# Patient Record
Sex: Female | Born: 1955 | Race: White | Hispanic: No | Marital: Married | Smoking: Never smoker
Health system: Southern US, Community
[De-identification: ages and names within clinical notes are randomized; demographics above are authoritative.]

## PROBLEM LIST (undated history)

## (undated) DIAGNOSIS — R112 Nausea with vomiting, unspecified: Secondary | ICD-10-CM

## (undated) DIAGNOSIS — R0600 Dyspnea, unspecified: Secondary | ICD-10-CM

## (undated) DIAGNOSIS — Z9889 Other specified postprocedural states: Secondary | ICD-10-CM

## (undated) DIAGNOSIS — T4145XA Adverse effect of unspecified anesthetic, initial encounter: Secondary | ICD-10-CM

## (undated) DIAGNOSIS — M5136 Other intervertebral disc degeneration, lumbar region: Secondary | ICD-10-CM

## (undated) DIAGNOSIS — M51369 Other intervertebral disc degeneration, lumbar region without mention of lumbar back pain or lower extremity pain: Secondary | ICD-10-CM

## (undated) DIAGNOSIS — J45909 Unspecified asthma, uncomplicated: Secondary | ICD-10-CM

## (undated) DIAGNOSIS — R2681 Unsteadiness on feet: Secondary | ICD-10-CM

## (undated) DIAGNOSIS — K5900 Constipation, unspecified: Secondary | ICD-10-CM

## (undated) DIAGNOSIS — M797 Fibromyalgia: Secondary | ICD-10-CM

## (undated) DIAGNOSIS — Z9289 Personal history of other medical treatment: Secondary | ICD-10-CM

## (undated) DIAGNOSIS — Z923 Personal history of irradiation: Secondary | ICD-10-CM

## (undated) DIAGNOSIS — F32A Depression, unspecified: Secondary | ICD-10-CM

## (undated) DIAGNOSIS — I639 Cerebral infarction, unspecified: Secondary | ICD-10-CM

## (undated) DIAGNOSIS — J189 Pneumonia, unspecified organism: Secondary | ICD-10-CM

## (undated) DIAGNOSIS — Z8489 Family history of other specified conditions: Secondary | ICD-10-CM

## (undated) DIAGNOSIS — T8859XA Other complications of anesthesia, initial encounter: Secondary | ICD-10-CM

## (undated) DIAGNOSIS — C801 Malignant (primary) neoplasm, unspecified: Secondary | ICD-10-CM

## (undated) DIAGNOSIS — C4490 Unspecified malignant neoplasm of skin, unspecified: Secondary | ICD-10-CM

## (undated) DIAGNOSIS — I10 Essential (primary) hypertension: Secondary | ICD-10-CM

## (undated) DIAGNOSIS — R51 Headache: Secondary | ICD-10-CM

## (undated) DIAGNOSIS — F329 Major depressive disorder, single episode, unspecified: Secondary | ICD-10-CM

## (undated) DIAGNOSIS — K219 Gastro-esophageal reflux disease without esophagitis: Secondary | ICD-10-CM

## (undated) DIAGNOSIS — R519 Headache, unspecified: Secondary | ICD-10-CM

## (undated) DIAGNOSIS — F419 Anxiety disorder, unspecified: Secondary | ICD-10-CM

## (undated) HISTORY — DX: Personal history of irradiation: Z92.3

## (undated) HISTORY — DX: Unsteadiness on feet: R26.81

## (undated) HISTORY — PX: ABDOMINAL HYSTERECTOMY: SHX81

## (undated) HISTORY — DX: Fibromyalgia: M79.7

## (undated) HISTORY — DX: Unspecified malignant neoplasm of skin, unspecified: C44.90

## (undated) HISTORY — PX: FOOT SURGERY: SHX648

## (undated) HISTORY — PX: CHOLECYSTECTOMY: SHX55

---

## 1999-12-24 ENCOUNTER — Ambulatory Visit
Admission: RE | Admit: 1999-12-24 | Discharge: 1999-12-24 | Payer: Self-pay | Admitting: Physical Medicine and Rehabilitation

## 2000-03-31 ENCOUNTER — Encounter: Payer: Self-pay | Admitting: Rheumatology

## 2000-03-31 ENCOUNTER — Encounter: Admission: RE | Admit: 2000-03-31 | Discharge: 2000-03-31 | Payer: Self-pay | Admitting: Rheumatology

## 2000-04-09 ENCOUNTER — Other Ambulatory Visit: Admission: RE | Admit: 2000-04-09 | Discharge: 2000-04-09 | Payer: Self-pay | Admitting: Gynecology

## 2001-07-22 ENCOUNTER — Emergency Department (HOSPITAL_COMMUNITY): Admission: EM | Admit: 2001-07-22 | Discharge: 2001-07-23 | Payer: Self-pay | Admitting: *Deleted

## 2001-08-05 ENCOUNTER — Encounter: Payer: Self-pay | Admitting: Orthopaedic Surgery

## 2001-08-05 ENCOUNTER — Ambulatory Visit (HOSPITAL_COMMUNITY): Admission: RE | Admit: 2001-08-05 | Discharge: 2001-08-05 | Payer: Self-pay | Admitting: Orthopaedic Surgery

## 2002-01-12 ENCOUNTER — Ambulatory Visit (HOSPITAL_COMMUNITY): Admission: RE | Admit: 2002-01-12 | Discharge: 2002-01-12 | Payer: Self-pay | Admitting: Cardiology

## 2003-02-18 ENCOUNTER — Emergency Department (HOSPITAL_COMMUNITY): Admission: EM | Admit: 2003-02-18 | Discharge: 2003-02-18 | Payer: Self-pay

## 2003-06-28 ENCOUNTER — Ambulatory Visit (HOSPITAL_COMMUNITY): Admission: RE | Admit: 2003-06-28 | Discharge: 2003-06-28 | Payer: Self-pay | Admitting: Family Medicine

## 2004-02-11 ENCOUNTER — Ambulatory Visit (HOSPITAL_COMMUNITY): Admission: RE | Admit: 2004-02-11 | Discharge: 2004-02-11 | Payer: Self-pay | Admitting: Orthopedic Surgery

## 2004-02-27 ENCOUNTER — Ambulatory Visit (HOSPITAL_COMMUNITY): Admission: RE | Admit: 2004-02-27 | Discharge: 2004-02-27 | Payer: Self-pay | Admitting: Orthopedic Surgery

## 2004-02-27 ENCOUNTER — Ambulatory Visit (HOSPITAL_BASED_OUTPATIENT_CLINIC_OR_DEPARTMENT_OTHER): Admission: RE | Admit: 2004-02-27 | Discharge: 2004-02-27 | Payer: Self-pay | Admitting: Orthopedic Surgery

## 2004-03-19 ENCOUNTER — Ambulatory Visit (HOSPITAL_COMMUNITY): Admission: RE | Admit: 2004-03-19 | Discharge: 2004-03-19 | Payer: Self-pay | Admitting: Orthopedic Surgery

## 2004-03-19 ENCOUNTER — Ambulatory Visit (HOSPITAL_BASED_OUTPATIENT_CLINIC_OR_DEPARTMENT_OTHER): Admission: RE | Admit: 2004-03-19 | Discharge: 2004-03-19 | Payer: Self-pay | Admitting: Orthopedic Surgery

## 2005-06-16 HISTORY — PX: ANTERIOR CERVICAL DISCECTOMY: SHX1160

## 2005-06-25 ENCOUNTER — Emergency Department (HOSPITAL_COMMUNITY): Admission: EM | Admit: 2005-06-25 | Discharge: 2005-06-25 | Payer: Self-pay | Admitting: Emergency Medicine

## 2005-10-24 ENCOUNTER — Inpatient Hospital Stay (HOSPITAL_COMMUNITY): Admission: RE | Admit: 2005-10-24 | Discharge: 2005-10-26 | Payer: Self-pay | Admitting: Neurosurgery

## 2005-11-26 ENCOUNTER — Ambulatory Visit (HOSPITAL_COMMUNITY): Admission: RE | Admit: 2005-11-26 | Discharge: 2005-11-26 | Payer: Self-pay | Admitting: Internal Medicine

## 2005-12-02 ENCOUNTER — Ambulatory Visit (HOSPITAL_COMMUNITY): Admission: RE | Admit: 2005-12-02 | Discharge: 2005-12-02 | Payer: Self-pay | Admitting: Family Medicine

## 2006-01-14 ENCOUNTER — Ambulatory Visit (HOSPITAL_COMMUNITY): Admission: RE | Admit: 2006-01-14 | Discharge: 2006-01-14 | Payer: Self-pay | Admitting: Family Medicine

## 2006-06-16 DIAGNOSIS — J189 Pneumonia, unspecified organism: Secondary | ICD-10-CM

## 2006-06-16 HISTORY — DX: Pneumonia, unspecified organism: J18.9

## 2006-06-16 HISTORY — PX: OTHER SURGICAL HISTORY: SHX169

## 2006-12-31 ENCOUNTER — Emergency Department (HOSPITAL_COMMUNITY): Admission: EM | Admit: 2006-12-31 | Discharge: 2006-12-31 | Payer: Self-pay | Admitting: Emergency Medicine

## 2007-02-23 ENCOUNTER — Ambulatory Visit (HOSPITAL_COMMUNITY): Admission: RE | Admit: 2007-02-23 | Discharge: 2007-02-23 | Payer: Self-pay | Admitting: Family Medicine

## 2007-04-16 ENCOUNTER — Ambulatory Visit (HOSPITAL_COMMUNITY): Admission: RE | Admit: 2007-04-16 | Discharge: 2007-04-16 | Payer: Self-pay | Admitting: Internal Medicine

## 2007-04-29 ENCOUNTER — Ambulatory Visit: Payer: Self-pay | Admitting: *Deleted

## 2007-06-09 ENCOUNTER — Emergency Department (HOSPITAL_COMMUNITY): Admission: EM | Admit: 2007-06-09 | Discharge: 2007-06-09 | Payer: Self-pay | Admitting: *Deleted

## 2007-06-17 HISTORY — PX: BACK SURGERY: SHX140

## 2007-06-30 ENCOUNTER — Ambulatory Visit (HOSPITAL_COMMUNITY): Admission: RE | Admit: 2007-06-30 | Discharge: 2007-06-30 | Payer: Self-pay | Admitting: Family Medicine

## 2007-08-31 ENCOUNTER — Inpatient Hospital Stay (HOSPITAL_COMMUNITY): Admission: RE | Admit: 2007-08-31 | Discharge: 2007-09-07 | Payer: Self-pay | Admitting: Neurosurgery

## 2007-09-07 ENCOUNTER — Inpatient Hospital Stay (HOSPITAL_COMMUNITY)
Admission: RE | Admit: 2007-09-07 | Discharge: 2007-09-22 | Payer: Self-pay | Admitting: Physical Medicine & Rehabilitation

## 2007-09-07 ENCOUNTER — Ambulatory Visit: Payer: Self-pay | Admitting: Physical Medicine & Rehabilitation

## 2007-09-14 ENCOUNTER — Ambulatory Visit: Payer: Self-pay | Admitting: Infectious Disease

## 2007-10-20 ENCOUNTER — Telehealth: Payer: Self-pay | Admitting: Internal Medicine

## 2007-10-20 ENCOUNTER — Ambulatory Visit: Payer: Self-pay | Admitting: Internal Medicine

## 2007-10-20 DIAGNOSIS — Z9889 Other specified postprocedural states: Secondary | ICD-10-CM | POA: Insufficient documentation

## 2007-10-20 DIAGNOSIS — IMO0002 Reserved for concepts with insufficient information to code with codable children: Secondary | ICD-10-CM

## 2007-10-20 DIAGNOSIS — L089 Local infection of the skin and subcutaneous tissue, unspecified: Secondary | ICD-10-CM | POA: Insufficient documentation

## 2007-11-09 ENCOUNTER — Ambulatory Visit: Payer: Self-pay | Admitting: Internal Medicine

## 2007-11-09 LAB — CONVERTED CEMR LAB
CRP: 2.9 mg/dL — ABNORMAL HIGH (ref <0.6)
Sed Rate: 71 mm/hr — ABNORMAL HIGH (ref 0–22)

## 2007-11-10 ENCOUNTER — Telehealth: Payer: Self-pay | Admitting: Internal Medicine

## 2007-11-10 ENCOUNTER — Encounter: Payer: Self-pay | Admitting: Internal Medicine

## 2007-11-17 ENCOUNTER — Encounter: Payer: Self-pay | Admitting: Internal Medicine

## 2007-11-22 ENCOUNTER — Encounter: Payer: Self-pay | Admitting: Internal Medicine

## 2007-11-26 ENCOUNTER — Encounter: Payer: Self-pay | Admitting: Internal Medicine

## 2007-12-02 ENCOUNTER — Telehealth: Payer: Self-pay | Admitting: Internal Medicine

## 2007-12-15 ENCOUNTER — Encounter: Payer: Self-pay | Admitting: Internal Medicine

## 2008-01-05 ENCOUNTER — Encounter: Payer: Self-pay | Admitting: Internal Medicine

## 2008-01-10 ENCOUNTER — Ambulatory Visit: Payer: Self-pay | Admitting: Internal Medicine

## 2008-01-19 ENCOUNTER — Encounter: Payer: Self-pay | Admitting: Internal Medicine

## 2008-01-24 ENCOUNTER — Telehealth: Payer: Self-pay | Admitting: Internal Medicine

## 2008-02-23 ENCOUNTER — Encounter: Payer: Self-pay | Admitting: Internal Medicine

## 2008-03-14 ENCOUNTER — Ambulatory Visit: Payer: Self-pay | Admitting: Internal Medicine

## 2008-03-18 ENCOUNTER — Emergency Department (HOSPITAL_COMMUNITY): Admission: EM | Admit: 2008-03-18 | Discharge: 2008-03-18 | Payer: Self-pay | Admitting: Emergency Medicine

## 2008-03-20 ENCOUNTER — Encounter: Payer: Self-pay | Admitting: Internal Medicine

## 2008-03-27 ENCOUNTER — Telehealth: Payer: Self-pay | Admitting: Internal Medicine

## 2008-03-27 ENCOUNTER — Encounter: Payer: Self-pay | Admitting: Internal Medicine

## 2008-04-05 ENCOUNTER — Telehealth: Payer: Self-pay

## 2008-04-06 ENCOUNTER — Telehealth: Payer: Self-pay | Admitting: Internal Medicine

## 2008-04-07 ENCOUNTER — Telehealth: Payer: Self-pay | Admitting: Internal Medicine

## 2008-05-12 ENCOUNTER — Encounter: Payer: Self-pay | Admitting: Internal Medicine

## 2008-05-12 ENCOUNTER — Ambulatory Visit (HOSPITAL_COMMUNITY): Admission: RE | Admit: 2008-05-12 | Discharge: 2008-05-12 | Payer: Self-pay | Admitting: Neurosurgery

## 2008-05-12 LAB — CONVERTED CEMR LAB
CRP: 0.8 mg/dL — ABNORMAL HIGH (ref <0.6)
Sed Rate: 64 mm/hr — ABNORMAL HIGH (ref 0–22)

## 2008-05-19 ENCOUNTER — Telehealth: Payer: Self-pay | Admitting: Internal Medicine

## 2008-05-24 ENCOUNTER — Encounter: Payer: Self-pay | Admitting: Internal Medicine

## 2008-07-03 ENCOUNTER — Ambulatory Visit: Payer: Self-pay | Admitting: Internal Medicine

## 2008-07-12 ENCOUNTER — Emergency Department (HOSPITAL_COMMUNITY): Admission: EM | Admit: 2008-07-12 | Discharge: 2008-07-12 | Payer: Self-pay | Admitting: Emergency Medicine

## 2008-08-11 ENCOUNTER — Encounter
Admission: RE | Admit: 2008-08-11 | Discharge: 2008-11-09 | Payer: Self-pay | Admitting: Physical Medicine & Rehabilitation

## 2008-08-14 ENCOUNTER — Ambulatory Visit: Payer: Self-pay | Admitting: Physical Medicine & Rehabilitation

## 2008-08-21 ENCOUNTER — Encounter (HOSPITAL_COMMUNITY)
Admission: RE | Admit: 2008-08-21 | Discharge: 2008-09-20 | Payer: Self-pay | Admitting: Physical Medicine & Rehabilitation

## 2008-08-30 ENCOUNTER — Encounter: Payer: Self-pay | Admitting: Internal Medicine

## 2008-09-14 ENCOUNTER — Ambulatory Visit: Payer: Self-pay | Admitting: Physical Medicine & Rehabilitation

## 2008-09-25 ENCOUNTER — Encounter (HOSPITAL_COMMUNITY)
Admission: RE | Admit: 2008-09-25 | Discharge: 2008-10-25 | Payer: Self-pay | Admitting: Physical Medicine & Rehabilitation

## 2008-10-03 ENCOUNTER — Ambulatory Visit: Payer: Self-pay | Admitting: Internal Medicine

## 2008-10-03 LAB — CONVERTED CEMR LAB
CRP: 0.8 mg/dL — ABNORMAL HIGH (ref <0.6)
Sed Rate: 12 mm/hr (ref 0–22)

## 2008-10-06 ENCOUNTER — Encounter
Admission: RE | Admit: 2008-10-06 | Discharge: 2008-10-06 | Payer: Self-pay | Admitting: Physical Medicine & Rehabilitation

## 2008-10-10 ENCOUNTER — Ambulatory Visit: Payer: Self-pay | Admitting: Physical Medicine & Rehabilitation

## 2008-11-03 ENCOUNTER — Encounter
Admission: RE | Admit: 2008-11-03 | Discharge: 2009-02-01 | Payer: Self-pay | Admitting: Physical Medicine & Rehabilitation

## 2008-11-07 ENCOUNTER — Ambulatory Visit: Payer: Self-pay | Admitting: Physical Medicine & Rehabilitation

## 2008-11-23 ENCOUNTER — Ambulatory Visit: Payer: Self-pay | Admitting: Internal Medicine

## 2008-11-27 ENCOUNTER — Encounter: Payer: Self-pay | Admitting: Internal Medicine

## 2008-12-06 ENCOUNTER — Ambulatory Visit: Payer: Self-pay | Admitting: Physical Medicine & Rehabilitation

## 2008-12-29 ENCOUNTER — Ambulatory Visit: Payer: Self-pay | Admitting: Physical Medicine & Rehabilitation

## 2009-01-16 ENCOUNTER — Ambulatory Visit: Payer: Self-pay | Admitting: Physical Medicine & Rehabilitation

## 2009-03-21 ENCOUNTER — Emergency Department (HOSPITAL_COMMUNITY): Admission: EM | Admit: 2009-03-21 | Discharge: 2009-03-21 | Payer: Self-pay | Admitting: Emergency Medicine

## 2009-04-09 ENCOUNTER — Encounter: Payer: Self-pay | Admitting: Internal Medicine

## 2009-09-03 ENCOUNTER — Telehealth (INDEPENDENT_AMBULATORY_CARE_PROVIDER_SITE_OTHER): Payer: Self-pay | Admitting: *Deleted

## 2010-07-06 ENCOUNTER — Encounter: Payer: Self-pay | Admitting: Family Medicine

## 2010-07-07 ENCOUNTER — Encounter: Payer: Self-pay | Admitting: Internal Medicine

## 2010-07-18 NOTE — Progress Notes (Signed)
Summary: Pt. w/ back concerns, referred to Dr. Sherwood Gambler or Dr. Venetia Maxon  Phone Note Call from Patient Call back at Home Phone [phone]   Caller: Patient Reason for Call: Talk to Nurse Summary of Call: Message left by pt. to call her back about obtaining an appt. w/ Dr. Orvan Falconer.  RN returned call.  Message left that Dr. Blair Dolphin schedule is full through the end of April.  The May 2011 and beyond schedule has not been placed in the computer.  RN advised the pt. to call either Dr. Sherwood Gambler or Dr. Venetia Maxon for immediate follow-up of back concerns. Jennet Maduro RN  September 03, 2009 3:12 PM

## 2010-10-22 ENCOUNTER — Other Ambulatory Visit (HOSPITAL_COMMUNITY): Payer: Self-pay | Admitting: Internal Medicine

## 2010-10-22 DIAGNOSIS — Z139 Encounter for screening, unspecified: Secondary | ICD-10-CM

## 2010-10-22 DIAGNOSIS — Z0000 Encounter for general adult medical examination without abnormal findings: Secondary | ICD-10-CM

## 2010-10-24 ENCOUNTER — Ambulatory Visit (HOSPITAL_COMMUNITY)
Admission: RE | Admit: 2010-10-24 | Discharge: 2010-10-24 | Disposition: A | Discharge status: Home / Self Care | Payer: Medicare Other | Source: Ambulatory Visit | Attending: Internal Medicine | Admitting: Internal Medicine

## 2010-10-24 DIAGNOSIS — Z1231 Encounter for screening mammogram for malignant neoplasm of breast: Secondary | ICD-10-CM | POA: Insufficient documentation

## 2010-10-24 DIAGNOSIS — Z0000 Encounter for general adult medical examination without abnormal findings: Secondary | ICD-10-CM

## 2010-10-29 NOTE — Assessment & Plan Note (Signed)
[name] returns today.  She has had some increased pain.  She almost  went to the ER, but did not.  She has been going through some physical  therapy.  She has been compliant with other recommendations such as  wearing support hose.   She has been increased on her MS Contin to t.i.d. and initially was  doing well until we switched her off her Lyrica and onto Neurontin.  She  is doing little bit better now that she is on a q.i.d. dose of  Neurontin.  Unfortunately, her peripheral edema, which was felt to be at  least in part due to her Lyrica has not improved whole a lot and has  been started on Lasix per her primary care physician.   INTERVAL HISTORY:  Increasing hip pain left greater than right,  interferes with sleep.   PHYSICAL EXAMINATION:  Obese female in no acute stress.  Footdrop 3-/5,  2+ pitting edema bilaterally.  She has tenderness in the left greater  than right hip and the trochanteric bursa injection and no pain with hip  range of motion.   IMPRESSION:  1. Lumbar post laminectomy syndrome.  2. Left footdrop.  She has too much edema to do an EMG at this point.      We will need to see her back after a month after she has been on      Lasix on a regular basis for a months' time.  3. Increase Neurontin to 300 mg.  Workup is q.i.d. over a course of 3      weeks.      Erick Colace, M.D.  Electronically Signed     AEK/MedQ  D:  10/10/2008 09:05:53  T:  10/10/2008 21:51:14  Job #:  [id number]   cc:   Madelin Rear. Sherwood Gambler, MD  Fax: [phone]

## 2010-10-29 NOTE — Assessment & Plan Note (Signed)
A 55 year old female, past history of cervical and lumbar post  laminectomy syndrome, ACDF of the cervical spine at L2-L5, fusion of  lumbar spine.  She has had no new problems.  She has had a recurrence of  her left hip pain.  Her last hip trochanteric bursa injection was in  April on that side.  She is losing her Medicaid due to getting on  disability.  She has some type of deductible that she has to meet and  she is concerned that she would be spending more at this clinic than she  would at her primary care physician's and is wondering whether her  primary care physician could at least temporarily take over her pain  medications.  She has had no signs of aberrant drug behavior.  Her urine  drug screen was last performed today, we do not have the results.  The  prior result was from August 22, 2008, and was consistent.   REVIEW OF SYSTEMS:  Positive weakness with tremor, trouble walking,  spasms, anxiety.  Her hip pain does not cause any numbness or tingling.   PAST HISTORY:  Diabetes, high blood pressure, arthritis.   SOCIAL HISTORY:  Married.   Blood pressure 143/71, pulse 97, respirations 18, and O2 sat 99% on room  air.  General, no acute stress.  Mood and affect appropriate.  She had  tenderness over the left greater trochanter.  Her lower extremity and  upper extremity strength are full.  Her gait is waddling mainly due to  her body habitus..   IMPRESSION:  1. Cervical post laminectomy syndrome.  2. Lumbar post laminectomy syndrome.  3. Trochanteric bursitis.      Erick Colace, M.D.  Electronically Signed     AEK/MedQ  D:  01/16/2009 12:45:49  T:  01/17/2009 02:06:04  Job #:  [id number]

## 2010-10-29 NOTE — Assessment & Plan Note (Signed)
She is a 55 year old female who has past medical history of cervical  post-laminectomy syndrome and lumbar post-laminectomy syndrome, status  post L2-L5 fusion.  She has comorbidities including diabetes and chronic  lower extremity edema.  She has had a subacromial decompression in 2005.   She has had physical therapy to improve independence.  She states she  keeps up with her program.  She has a history of falls.  We could not do  lower extremity EMG secondary to her chronic edema.  Her edema did not  improve much after we switched her from Lyrica to Neurontin.  She has  had partial relief from left hip troch bursitis with injection on October 11, 2008.  She has had chronic left foot drop.   She has been maintained on MS Contin CR 15 mg t.i.d.  She is up to  gabapentin 300 q.i.d.   She had another fall, but no serious injury.  She has difficulty getting  up from a fall.   PHYSICAL EXAMINATION:  GENERAL:  She exhibits fear avoidance patterns,  even with small amount of movement, she is afraid that her pain is going  to get worse.  EXTREMITIES:  She has 2-3+ pitting edema bilaterally.  She has 3- /5  strength and ankle dorsiflexion on the left, 5/5 on the right, 5/5 on  the knee extensor and hip flexor bilaterally.  She has some mild pain  with internal external rotation, but mainly pain with palpation over the  right hip greater than left hip.  Upper extremity strength range of  motion is only mildly reduced.  She has 4+/5 strength.  She has some  decreased internal rotation right shoulder.  Negative impingement  testing.  Negative AC joint testing.   IMPRESSION:  1. Lumbar post-laminectomy syndrome.  2. Left foot drop.  3. Cervical post-laminectomy syndrome.  4. History of subacromial decompression for shoulder pain.  5. Fall risk mainly chronic pain related, but also some influence from      foot drop.   PLAN:  1. We will her continue MS Contin 15 t.i.d.  2. Continue  gabapentin 300 q.i.d.  3. Lower extremity edema, improves.  We can do lower extremity EMG.  4. Right hip injection today.  5. Continue her outpatient exercises.  At this point, we will not put      her through balance program, but if she has another fall, we will      put her in a balance program.  She is to continue her exercises      that she received from recent physical therapy training.   I discussed with the patient agreement with plan.  Oswestry score today  is 82%, which is higher than what would expect from somebody living  independently and able to attending physician visit.      Erick Colace, M.D.  Electronically Signed     AEK/MedQ  D:  11/07/2008 10:00:18  T:  11/08/2008 00:09:58  Job #:  [id number]

## 2010-10-29 NOTE — H&P (Signed)
[name], [name] NO.:  [id number]   MEDICAL RECORD NO.:  [id number]          PATIENT TYPE:  IPS   LOCATION:  4030                         FACILITY:  [facility]   PHYSICIAN:  Ranelle Oyster, M.D.DATE OF BIRTH:  December 28, 1955   DATE OF ADMISSION:  09/07/2007  DATE OF DISCHARGE:                              HISTORY & PHYSICAL   DATE OF ADMISSION:  September 07, 2007   CHIEF COMPLAINTS:  Back pain and radiculopathy.   HISTORY OF PRESENT ILLNESS:  This is a 55 year old white female admitted  with persistent low-back pain radiating into both legs, right greater  than left.  X-rays and imaging showed a herniated lumbar disk with  stenosis, radiculopathy at L2-L3, L3-L4, L4-L5.  The patient underwent  bilateral laminotomies and diskectomy and PLIF on March 17, by Dr.  Venetia Maxon.  Course was complicated by CSF leak.  The patient placed on  bedrest for 3 days and a short-term steroid protocol.  The patient is in  a back brace when out of bed.  The patient continues to require mod  assist for basic mobility and transfers. She is having difficulties with  persistent left foot drop and weakness really in both legs, left more  than right.  Pain has been under fair control with analgesics.  Because  the above medical and functional issues, the patient was brought to  inpatient rehab unit today.   REVIEW OF SYSTEMS:  Notable for anxiety, some depression.  She has been  moving her bowels and bladder.  She complains of positional vertigo with  head turning to the left.   PAST MEDICAL HISTORY:  1. Positive for fibromyalgia.  2. Hypertension.  3. Depression.  4. Anxiety.  5. Non insulin-requiring diabetes.   She denies alcohol or tobacco use.   FAMILY HISTORY:  Is noncontributory.   SOCIAL HISTORY:  The patient lives with her husband and works at [employer] doing a lot of physical work.  Husband is on disability.  The  patient has a sister in the area who can assist her as  needed at home.   ALLERGIES:  HYDROCODONE AND SULFA.   MEDICATIONS AT HOME:  1. Enalapril 5 mg q. daily.  2. Actos and metformin 15/500 b.i.d.  3. Lyrica 75 mg b.i.d.  4. Glipizide 5 mg two twice a day.  5. Endolac p.r.n.  6. Alprazolam p.r.n.  7. Flexeril p.r.n.   LABS:  Hemoglobin 11.2, sodium 137, potassium 4.7, BUN and creatinine 50  and 1.06.   PHYSICAL EXAM:  Blood pressure 138/84, pulse 103, respiratory 18,  temperature 97.3.  Patient is pleasant, alert, oriented x3.  She is  obese.  She is sitting on edge of bed with her back brace in place.  EAR, NOSE and THROAT:  Exam unremarkable with fair dentition and pink  moist mucosa.  NECK: Supple without JVD.  HEART:  Regular rate and rhythm.  LUNGS: Clear.  ABDOMEN: Soft, nontender.  SKIN:  Was generally intact with wound clean and well-approximated  without drainage.  NEUROLOGICALLY:  Cranial nerves II-XII are intact.  Reflexes are 2+  the  upper 1+ lower extremities.  Sensation was decreased particularly in the  L4-L5 dermatome on the left side more than right.  Judgment, orientation  and mood were all within functional limits.  Strength was 5/5 both upper  extremities.  She is 2-/5 at the hips on the right, 2-/5 at the knee,  2/5 at the ankle.  Left lower extremities 1+/5 at the knee and hip, 1+/5  left plantarflexion and trace to 1-/5 ankle dorsiflexion left side.   ASSESSMENT/PLAN:  1. Functional deficits secondary to herniated lumbar disk disease with      radiculopathy particularly at L4-L5.  The patient is status post      bilateral laminectomies and diskectomies L2-L3, L3-L4, L4-L5 with      PLIF on March 17.  The patient had a postoperative CSF leak.  The      patient is admitted to inpatient rehab today due to the fact that      her medical and therapy needs cannot be met at a lesser intensity      of service.  PT will assess and treat the patient for range of      motion, strengthening, appropriate orthotics  and balance techniques      as appropriate.  OT will assess and treat for range of motion,      strengthening, equipment and safety.  Rehab nurse will follow on 24-      hour basis for skin, pain management and bowel, bladder needs.      Rehab case manager/social worker will assess for psychosocial needs      and discharge planning.  Estimated length of stay is 2 weeks. Goals      are supervision to min assist. Prognosis is fair to good.  2. Pain control.  IV Decadron tapering off.  Continue oxycodone IR,      Robaxin and Lyrica.  3. Hypertension:  Continue Vasotec.  4. Diabetes:  Continue Actos and Glucotrol XL with Glucophage.      Monitor and adjust as appropriate.  5. Wound care:  Continue dry dressing.  6. Anxiety and depression:  Continue Xanax for now.  7. Deep vein thrombosis prophylaxis:  Add subcutaneous  Lovenox 30 mg      q.12 h for this.      Ranelle Oyster, M.D.  Electronically Signed     ZTS/MEDQ  D:  09/07/2007  T:  09/07/2007  Job:  [id number]

## 2010-10-29 NOTE — Procedures (Signed)
[name], BUSER NO.:  [id number]   MEDICAL RECORD NO.:  [id number]          PATIENT TYPE:  REC   LOCATION:  TPC                          FACILITY:  [facility]   PHYSICIAN:  Erick Colace, M.D.DATE OF BIRTH:  01-12-1956   DATE OF PROCEDURE:  DATE OF DISCHARGE:                               OPERATIVE REPORT   PROCEDURE:  This is a right hip trochanteric bursa injection.   INDICATION:  Right hip bursitis only partially responsive to medication  management, interfering with activity level.   Informed consent was obtained after describing the risks and benefits of  the procedure with the patient.  These include bleeding, bruising,  infection, as well as increase in her blood sugar.   The patient was placed in the left lateral decubitus position.  Area was  marked and prepped with Betadine and alcohol, entered with 21-gauge, 2-  inch needle, 1 mL of 40 mg/mL Depo-Medrol plus 4 mL of 1% lidocaine  injected.  The patient tolerated the procedure well.  Injection done  after negative drawback for blood.      Erick Colace, M.D.  Electronically Signed     AEK/MEDQ  D:  11/07/2008 10:01:22  T:  11/08/2008 00:15:15  Job:  [id number]

## 2010-10-29 NOTE — Discharge Summary (Signed)
[name], [name] NO.:  [id number]   MEDICAL RECORD NO.:  [id number]          PATIENT TYPE:  IPS   LOCATION:  4030                         FACILITY:  [facility]   PHYSICIAN:  Mariam Dollar, P.A.  DATE OF BIRTH:  1956/01/14   DATE OF ADMISSION:  09/07/2007  DATE OF DISCHARGE:  09/22/2007                               DISCHARGE SUMMARY   DISCHARGE DIAGNOSES:  1. Herniated lumbar disk spondylosis, stenosis with radiculopathy.  2. Pain management.  3. Lumbar wound infection of Escherichia coli.  4. Hypertension.  5. Non-insulin-dependent diabetes mellitus.  6. Anxiety with depression.  7. Subcutaneous Lovenox for deep vein thrombosis prophylaxis.   PROCEDURE:  Status post bilateral laminectomies with diskectomies, L2-  L3, L3-L4, and L4-L5 with PLIF, March 17.   HISTORY OF PRESENT ILLNESS:  This is a 55 year old white female admitted  with persistent low back pain radiating to bilateral lower extremities,  right greater than left.  X-rays and imaging showed herniated lumbar  disk with stenosis radiculopathy L2-L3, L3-L4, and L4-L5.  He underwent  bilateral laminectomies with diskectomies, PLIF on March 17 per Dr.  Venetia Maxon.  Hospital course complicated by cerebrospinal fluid leak with  bedrest x3 days, conservative care, and she completed a steroid  protocol.  Back brace when out of bed.   PAST MEDICAL HISTORY:  See discharge diagnoses.  No alcohol or tobacco.   ALLERGIES:  1. HYDROCODONE.  2. SULFA.   SOCIAL HISTORY:  Lives with husband.  She works at [employer] that  requires much lifting.  Husband is on disability.  She has a sister in  the area that would check on her.  They live in a one level home with 2  steps to entry.   MEDICATIONS PRIOR TO ADMISSION:  1. Vasotec 5 mg daily.  2. Actos with Glucophage 15/500 mg twice daily.  3. Lyrica 75 mg twice daily.  4. Glipizide 5 mg 2 tablets daily.  5. Flexeril as needed.  6. Alprazolam needed.   REHABILITATION HOSPITAL COURSE:  The patient was admitted to inpatient  rehab services with therapies initiated on a 3-hour daily basis  consisting of physical therapy, occupational therapy, and rehabilitation  nursing.  The following issues were addressed during the patient's  rehabilitation stay.  Pertaining [name]'s herniated lumbar disk with  stenosis radiculopathy, she had undergone bilateral laminectomies,  diskectomies, L2-L3, L3-L4, and L4-L5 with PLIF on March 17.  She was  wearing a back brace when out of bed and followed by neurosurgery, Dr.  Danae Orleans. Venetia Maxon.  Noted during her hospital course, the patient with  some increased drainage from her wound that on March 31 showed E. coli.  Thus, she was placed on intravenous antibiotics of vancomycin and  meropenem.  Her meropenem was later discontinued and presently remained  on vancomycin.  Meropenem had since been resumed after infectious  disease followup.  Due to insurance constraints medications simplified  per Dr. Orvan Falconer of infectious diseases as it was still recommended and  she remained on vancomycin through Oct 22, 2007, and she was now changed  to p.o.  Augmentin 875 mg twice daily until Oct 22, 2007 and follow up  with infectious disease.  She did receive a CT of lumbar spine on March  28 that showed extensive fusion from lumbar L2-L5, some soft tissue  swelling, some fluid along the operative approach.  Followup review per  Dr. Venetia Maxon advised to continue antibiotics after consulting with Dr.  Orvan Falconer.  Pain management ongoing.  She completed a course of Decadron  taper.  She was placed on a Duragesic patch of 75 mg to change every 72  hours as well as oxycodone for breakthrough pain.  Her blood sugars  overall were well-controlled, 96, 119, 123.  She remained on her Actos  with Glucophage as well as glipizide 10 mg daily.  The patient was on  Lyrica 75 mg twice daily prior to admission.  This had been increased to  3  times daily to help aid in her overall pain control.  She remained on  subcutaneous Lovenox for deep vein thrombosis prophylaxis.   Overall for her functional status, she was minimal assist overall for  mobility, supervision upper body bathing and dressing.  She was able to  place her back brace in the sitting position at the edge of bed.  She  was minimal assist for lower body bathing and dressing.   LABORATORY DATA:  Latest labs showed hemoglobin 9.5, hematocrit 27.5,  platelet 375,000, white blood cell count of 8.  Sodium 138, potassium  3.4, BUN 4, creatinine 0.6.   DISCHARGE MEDICATIONS:  1. Vasotec 5 mg p.o. daily.  2. Glipizide XL 10 mg daily.  3. Actos 15 mg/Glucophage 500 mg twice daily.  4. Duragesic patch 75 mg change every 72 hours.  5. Lyrica 75 mg 3 times daily.  6. Lasix 20 mg daily.  7. Potassium chloride 20 mEq daily.  8. Vancomycin intravenously 1250 mg every 10 hours until Oct 22, 2007      and stop.  9. Augmentin 875 mg p.o. b.i.d. until Oct 22, 2007 and stop.  10.Oxycodone immediate release 5 mg one or 2 tablets every 4 hours as      needed for pain, dispense of 90 tablets.   ACTIVITY:  As tolerated with back brace when out of bed.   DIET:  Diabetic diet.   SPECIAL INSTRUCTIONS:  The patient will follow up at [facility]  for ongoing outpatient intravenous vancomycin, as this could not be  provided at home due to insurance constraints.  She should follow up  with Dr. Cliffton Asters of infectious disease, call for appointment; Dr.  Sherwood Gambler, medical management; and Dr. Venetia Maxon, neurosurgery, [phone].      Mariam Dollar, P.A.     DA/MEDQ  D:  09/21/2007  T:  09/22/2007  Job:  [id number]   cc:   Ellwood Dense, M.D.  Danae Orleans. Venetia Maxon, M.D.  Madelin Rear. Sherwood Gambler, MD

## 2010-10-29 NOTE — Procedures (Signed)
[name], [name] NO.:  [id number]   MEDICAL RECORD NO.:  [id number]           PATIENT TYPE:   LOCATION:                                 FACILITY:   PHYSICIAN:  Erick Colace, M.D.DATE OF BIRTH:  04/04/1956   DATE OF PROCEDURE:  01/16/2009  DATE OF DISCHARGE:                               OPERATIVE REPORT   A 55 year old female with cervical lumbar post laminectomy syndrome, now  has trochanteric bursitis, recurrence.  Left hip has had previous good  relief with injection.  Pain is only partially responsive to medication  management and other conservative care.   Informed consent was obtained after describing the risks and benefits of  the procedure with the patient.  These include bleeding, bruising, and  infection.  She elects to proceed and has given written consent.  The  patient was placed in a right lateral decubitus position.  Area marked  and prepped with Betadine and alcohol, entered with 25-gauge 3-inch  spinal needle, inserted bone contact slightly withdrawn, injected with 1  mL of 40 mg/mL Depo-Medrol, 4 mL of 1% MPF lidocaine.  The patient  tolerated the procedure well.  Post-injection instructions given.      Erick Colace, M.D.  Electronically Signed     AEK/MEDQ  D:  01/16/2009 12:43:18  T:  01/17/2009 02:10:29  Job:  [id number]

## 2010-10-29 NOTE — Assessment & Plan Note (Signed)
A 55 year old female who has a prior history of C5-C6 and C6-C7 ACDF.  She had subsequent L2 through L5 fusion for lumbar radiculopathy.  She  has had a left foot drop since March 2009.  She was seen by me in  initial consultation approximately 1 month ago.  She has been followed  by Dr. Venetia Maxon.  We converted her from oxycodone to MS Contin initially at  15 mg b.i.d. with the sustained release.  We had to go up to 3 times a  day which was helpful.  She was originally scheduled for EMG today;  however, she has complained of increasing leg swelling.   Other medications include Lyrica 75 p.o. b.i.d.  She is on enalapril,  Actos plus metformin, alprazolam, glipizide, Celexa, and Lidoderm.   ALLERGIES:  To SULFA.   INTERVAL HISTORY:  She has been going to physical therapy.  She joined a  gym.  She is going to complete her physical therapy.  Has about 2 or 3  more weeks left on that.  Trial of TENS unit has been helpful thus far.   PHYSICAL EXAMINATION:  Obese female in no acute distress.  She has a  left foot drop graded as 3-/5 strength left ankle dorsiflexion.  She has  2+ pitting edema bilateral feet in pretibial area.   IMPRESSION:  1. Lumbar post-laminectomy syndrome.  2. Left foot drop question radicular versus peroneal.  3. She had multiple postoperative complications and prolonged bedrest.  4. She is diabetic, would be prone to peripheral nerve compression.  5. Peripheral edema may be related to Lyrica.  We will discontinue      Lyrica and switch her to gabapentin 100 mg at bedtime x7 days and      increase to b.i.d. x7 days, t.i.d. x7 days, and q.i.d. thereafter.   I will see her back in 3 weeks.  I have also put her in knee-high TED  hose, and I have instructed her to keep her legs elevated when sitting  around.  Hopefully, her edema will reduce to the point where we could do  an EMG.      Erick Colace, M.D.  Electronically Signed     AEK/MedQ  D:  09/14/2008  15:09:33  T:  09/15/2008 01:50:21  Job #:  [id number]

## 2010-10-29 NOTE — Discharge Summary (Signed)
[name], [name] NO.:  [id number]   MEDICAL RECORD NO.:  [id number]          PATIENT TYPE:  INP   LOCATION:  3008                         FACILITY:  [facility]   PHYSICIAN:  Danae Orleans. Venetia Maxon, M.D.  DATE OF BIRTH:  12-15-1955   DATE OF ADMISSION:  08/31/2007  DATE OF DISCHARGE:  09/07/2007                               DISCHARGE SUMMARY   REASON FOR ADMISSION:  1. Lumbar disk herniation.  2. Lumbosacral spondylosis.  3. Hypertension.  4. Type 2 diabetes.  5. Neurotic depression.  6. Intraoperative durotomy.  7. Acute blood loss anemia related to surgical procedure.   FINAL DIAGNOSES:  1. Lumbar disk herniation.  2. Lumbosacral spondylosis.  3. Hypertension.  4. Type 2 diabetes.  5. Neurotic depression.  6. Intraoperative durotomy.  7. Acute blood loss anemia related to surgical procedure.   HISTORY OF PRESENT ILLNESS:  [name] is a 55 year old woman with  severe lumbar disk disease with severe stenosis at L4-5 and L3-4 and L2-  3 levels.  L5-S1 and L1-2 levels were normal.  She had been treated  conservatively for quite some time without resolution of her discomfort  and was gradually having increasing difficulty with mobility.  It was  elected to take her for surgery on August 31, 2007.  She underwent  bilateral laminectomies and diskectomies at L2-3, L3-4, L4-5 levels with  PEEK interbody cages, autograft BMP for process pedicle screw fixation,  and posterolateral arthrodesis.  The surgery was technically complicated  by the fact that the dura was then adherent to bony spurs and there a  small durotomy was created at the time of surgery.  She was kept on  bedrest initially postoperatively for 3 days and was gradually  mobilized.  She had no significant complaint of headache.  She was felt  to be a good patient for inpatient rehabilitation service and was  transferred to the rehabilitation service on September 07, 2007.   PLANS:  To mobilize the  patient and work on pain management.      Danae Orleans. Venetia Maxon, M.D.  Electronically Signed     JDS/MEDQ  D:  10/12/2007  T:  10/13/2007  Job:  [id number]

## 2010-10-29 NOTE — Group Therapy Note (Signed)
[name] is a 55 year old female kindly referred by Dr. Reed Pandy. [name]  for evaluation of back and lower extremity pain.  She has a long history  of neck pain and back pain.  Her neck pain has been going on for greater  than 3 years.  She underwent C5-6, C6-7 ACDF for herniated nucleus  pulposus at those levels, developed some bilateral lower extremity pain  which was evaluated by Vascular Surgery, just found to have some mild  tibial occlusive disease and then had further workup per Dr. Venetia West and  underwent L2 through L5 fusion for lumbar radiculopathy.  She was seen  in inpatient rehab on September 07, 2007, because of a prolonged  hospitalization having had a CSF leak, left footdrop, lumbar wound  infection with E. coli, and pain management problems.  After inpatient  rehab, she received some home health therapy.  She had a followup from  ID.  She had gone to the ED a couple of times in both October 2009 where  she got some Dilaudid as well as June 25, 2008, where she received  some oxycodone.   CURRENT MEDICATIONS:  1. Premarin.  2. Lyrica 75 b.i.d.  3. Enalapril.  4. ACTOplus.  5. Alprazolam.  She takes 0.5 mg nightly p.r.n., maybe once a week for      sleep.  6. Glipizide ER 10 mg per day.  7. Celexa 20 mg a day.  8. Oxycodone 5 mg one to two p.o. four times a day.  She gets 120 for      a month.  She states she has 2-week supply in her home medication      dispenser, only three in her bottle with her today.  The      prescription was dated August 07, 2008.  9. She also gets a Lidoderm patch which she puts on everyday.   She is complaining of 8-9/10 pain occurring both at rest and with  activity, walking, bending, standing tend to make it worse.  Rest, heat,  medication tend to makes it better.  Good results with her medications  per her report.  She has burning and constant aching pain in her lower  extremities and back.  She can walk 5 minutes at a time.  She drives but  does not climb steps.  She still needs help with dressing, bathing,  toileting, meal prep, and household duties.  She is applying for  disability.   Her review of system is positive for trouble walking, spasms, confusion,  depression, weight gain, night sweats, blood sugar regulation problems,  limb swelling, and shortness of breath.   PAST MEDICAL HISTORY:  Hypertension as well as rotator cuff tear as well  as carpal tunnel syndrome.   PAST SURGICAL HISTORY:  Cholecystectomy, hysterectomy, subacromial  decompression in 2005, and carpal tunnel release in 2005.   SOCIAL HISTORY:  Married.   FAMILY HISTORY:  Heart disease, lung disease, diabetes, high blood  pressure, and disability.   PHYSICAL EXAMINATION:  Blood pressure 124/57, pulse 91, respirations 18,  and O2 sat 96% room air.  Obese female in no acute stress.  Orientation  x3.  Affect is alert.  Gait is slow.  She has a mild left footdrop.  She  was unable to heel walk or toe walk due to balance and stating that it  hurts her.   Her neck range of motion is 50% forward flexion, extension, lateral  rotation, and bending.  She has mild tenderness  to palpation in the  cervical paraspinal muscles.  Her shoulder range of motion is full,  although she moves very slowly.  She is afraid to move quickly because  of potential pain in her back.  Her back has tenderness to palpation in  thoracolumbar paraspinals as well as lumbosacral paraspinals.  Her lower  extremity range of motion is 75% of range in internal and external  rotation, full at the knees and ankles.  She has 3/5 strength in the  left ankle dorsiflexors and 4/5 in the hip flexion and knee extensor.   Sensation is intact bilateral lower extremities, however, as well as  bilateral upper extremities.   IMPRESSION:  1. Lumbar postlaminectomy syndrome.  2. Left footdrop, question radicular versus peroneal.  She is      diabetic, prone to peripheral nerve compression.  3.  Cervical postlaminectomy syndrome.  This has left her with her      pains at this current time.  4. Poor functional status more than what I would expect following      fusion procedure.  We will set her up for EMG, NCV left lower      extremity.  5. Urine drug screen.  Should she have a consistent finding, i.e. no      illicit drugs, no undisclosed opiates, would take over narcotic      analgesic medications and probably need to change her to longer-      acting medicines such as MS Contin.  We will also send her to [facility] for OT, so she can get      independent with her ADLs and PT to improve her mobility.  I do not      think she should have to rely on her sister for a lot of her self-      care.   Discussed with the patient.  She is in agreement with the plan.  Thank  you for an interesting referral.      Erick Colace, M.D.  Electronically Signed     AEK/MedQ  D:  08/14/2008 16:39:47  T:  08/15/2008 16:10:96  Job #:  [id number]

## 2010-10-29 NOTE — Procedures (Signed)
[name], West NO.:  [id number]   MEDICAL RECORD NO.:  [id number]          PATIENT TYPE:  REC   LOCATION:  [facility]PC                          FACILITY:  [facility]   PHYSICIAN:  Erick Colace, M.D.DATE OF BIRTH:  [birth year]   DATE OF PROCEDURE:  10/11/2008  DATE OF DISCHARGE:                               OPERATIVE REPORT   PROCEDURE:  Left hip trochanteric bursa injection.   INDICATIONS:  Left hip trochanteric bursitis, pain keeping her up at  night only partially responsive to medication management and physical  therapy.   Informed consent was obtained after describing risks and benefits of the  procedure with the patient.  These include bleeding, bruising, and  infection.  She elects proceed and has given written consent.  The  patient placed in the right lateral decubitus position.  Area marked and  prepped with Betadine with assistance of RN, Alicia Amel then a 25-gauge  3-inch needle was inserted until bone contact, slightly withdrawn.  Then  a solution containing 1 mL of 40 mg/mL, Depo-Medrol and 3 mL of 1%  lidocaine injected after negative drawback for blood.  The patient  tolerated the procedure well.  Pre and post injection vitals stable.  Post injection instructions given.      Erick Colace, M.D.  Electronically Signed     AEK/MEDQ  D:  10/10/2008 09:03:50  T:  10/10/2008 21:45:30  Job:  [id number]

## 2010-10-29 NOTE — Assessment & Plan Note (Signed)
This is a followup visit for chronic back and neck pain as well as lower  extremity pain.  Date of last visit is December 06, 2008, with nursing and  with me on Nov 07, 2008.   A 55 year old female with past history of cervical and lumbar post  laminectomy syndrome, ACDF in the cervical spine, L2-L5 fusion in the  lumbar spine.  She has completed physical therapy.  She keeps up with  her program.  She has been doing some aquatic exercise in her neighbors'  pool.  We have gone back and forth between Lyrica and Neurontin for her  neuropathic pain.  She has had some drowsiness with gabapentin.  The  Lyrica seem to work better, but does not have insurance coverage for  this.  She has had no other medical problems in the interval time.  She  has a question about some skin areas on her hands and wrists.   Her pain is 7/10, average is 8, pain is described as sharp, burning,  dull, aching in lower extremities and back area, interferes with  activity at a moderate level.  Improves with rest and medications, and  increased with walking, bending, and standing.  She can walk 15-20  minutes at a time.  She drives.  She does not climb steps.   She needs help occasionally with dressing and bathing, meal prep, and  household duties.  Her review of systems positive for numbness,  tingling, trouble walking, spasms, confusion, anxiety, weight gain, skin  rashes, blood sugar problems, and coughing.  Other physicians include  Dr. Sherwood Gambler who she was seen in regards to her skin.   PAST MEDICAL HISTORY:  Hypertension and diabetes.   SOCIAL HISTORY:  Married.   FAMILY HISTORY:  Heart disease, lung disease, diabetes, high blood  pressure, and disability.   PHYSICAL EXAMINATION:  VITAL SIGNS:  Blood pressure 123/91, pulse 95,  respirations 16, and O2 sat is 98% on room air.  GENERAL:  No acute stress.  Orientation x3.  Affect is alert.  Gait is  with pain.  EXTREMITIES:  Coordination, normal deep tendon  reflexes are reduced at  the ankles, intact at the knees.  She has mild tenderness to palpation  on lumbar spine.  She has some numbness in the feet.  She has 2+ edema  in lower extremities.  Indicates that edema is reduced after sleeping  all night.   IMPRESSION:  Lumbar post laminectomy syndrome as well as cervical post  laminectomy syndrome.  She has lower extremity paresthesias, which I  think are mainly due to diabetic neuropathy.  Certainly would have  multiple potential etiologies including radicular, however, she has too  much edema to do any EMG and CV on.   PLAN:  1. We will continue MS Contin 15 t.i.d.  2. Continue gabapentin, but increase to 400 and reduce it to t.i.d.  3. Hold off on hip injection today.  She has had some benefit, has      some prolonged benefit, want to minimize steroids given her      diabetes.  4. Continue her home exercise program in full.   I will see her back in 2 months and nursing visit in 1 month.      Erick Colace, M.D.  Electronically Signed     AEK/MedQ  D:  12/29/2008 16:42:34  T:  12/30/2008 03:10:46  Job #:  [id number]   cc:   Madelin Rear. Sherwood Gambler, MD  Fax: [phone]  Danae Orleans. Venetia Maxon, M.D.  Fax: [phone]

## 2010-10-29 NOTE — Consult Note (Signed)
VASCULAR SURGERY CONSULTATION   [name], [name]  DOB:  [birth year]                                       04/29/2007  [id number]#:[id number]   REFERRING PHYSICIAN:  Madelin Rear. Sherwood Gambler, M.D.   NEUROSURGEONDanae Orleans. Venetia Maxon, M.D.   REASON FOR CONSULTATION:  Bilateral leg pain.   HISTORY:  Patient is a 55 year old diabetic female with a history of  bilateral leg pain. This is present at rest.  She has pain that extends  from her hips down into her feet, worsened by minimal activity.  Improved by lying down.  Has rest pain.  Awakens her at night.  Denies  ulceration.   Is being considered for lumbar disk surgery by Dr. Venetia Maxon and referred  for clearance regarding her vascular disease.   PAST MEDICAL HISTORY:  1. Type 2 diabetes.  2. Hypertension.  3. Obesity.   MEDICATIONS:  1. Acto Plus 500/15 twice daily.  2. Glipizide 5 mg daily.  3. Furosemide 20 mg daily.  4. Enalapril 5 mg daily.  5. Lyrica 75 mg nightly.  6. Indocet 10/325 1 every 4-6 hours p.r.n.   ALLERGIES:  CODEINE, SULFA.   SOCIAL HISTORY:  The patient is married.  She is not working.  She does  not smoke or use alcohol products.  Lives with her husband.  Disabled by  chronic pain.   REVIEW OF SYSTEMS:  Refer to patient encounter form.  This was reviewed  today.  Positive findings include depression, arthritis and joint  discomfort, leg pain.  Negative findings include any constitutional,  cardiac, pulmonary, GI, GU, neurologic, ENT or bleeding problems.   PHYSICAL EXAMINATION:  Obese 55 year old female.  Alert and oriented.  No acute distress.  BP 141/79, pulse 88 per minute and regular,  respirations 16 per minute.  Her lower extremities reveal diffuse  obesity of the legs.  She has 1-2+ edema.  Dorsalis pedis pulses 2+  bilaterally.  Posterior tibial pulses 1+ bilaterally.  No evidence of  embolization.  No ulceration or gangrene.   INVESTIGATIONS:  Lower extremity arterial Doppler  evaluation reveals  normal ankle brachial indices, 0.99 on the right, 1.1 on the left.  The  right posterior tibial artery does reveal a pressure gradient from  popliteal to ankle, revealing a likely stenosis.  Dorsalis pedis  waveforms mildly diminished.   IMPRESSION:  1. 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  2. Type 2 diabetes.  3. Hypertension.  4. Degenerative lumbar disk disease with chronic leg pain.  5. Obesity.   RECOMMENDATIONS:  Patient has evidence of mild tibial vessel occlusive  disease associated with diabetes.  Continue primary prevention,  including control of diabetes, regular exercise, and a baby aspirin  daily.   No followup arranged at this time.  Recommend continued evaluation per  primary care and possible lower extremity Doppler evaluation in 2-3  years.   Balinda Quails, M.D.  Electronically Signed  PGH/MEDQ  D:  04/29/2007  T:  04/30/2007  Job:  [id number]   cc:   Danae Orleans. Venetia Maxon, M.D.  Madelin Rear. Sherwood Gambler, MD

## 2010-10-29 NOTE — Op Note (Signed)
[name], DYE NO.:  [id number]   MEDICAL RECORD NO.:  [id number]          PATIENT TYPE:  INP   LOCATION:  3172                         FACILITY:  [facility]   PHYSICIAN:  Danae Orleans. Venetia Maxon, M.D.  DATE OF BIRTH:  06/16/1956   DATE OF PROCEDURE:  08/31/2007  DATE OF DISCHARGE:                               OPERATIVE REPORT   PREOPERATIVE DIAGNOSIS:  Herniated lumbar disk with spondylosis,  stenosis, lumbar radiculopathy L2-3, L3-4, L4-5 levels.   POSTOPERATIVE DIAGNOSIS:  Herniated lumbar disk with spondylosis,  stenosis, lumbar radiculopathy L2-3, L3-4, L4-5 levels.   PROCEDURE:  1. Bilateral laminectomies with a diskectomies, L2-3, L3-4 and L4-5      levels, with posterior lumbar interbody fusion with PEEK cages with      BMP, autograft and Fortross, with pedicle screw fixation (segmental      instrumentation) L2 through L5 bilaterally, with posterolateral      arthrodesis using autograft, BMP and Fortross.  2. Microdissection.   SURGEON:  Danae Orleans. Venetia Maxon, M.D.   ASSISTANTS:  Georgiann Cocker, RN, and Coletta Memos, M.D.   ANESTHESIA:  General endotracheal anesthesia.   ESTIMATED BLOOD LOSS:  850 mL with 2 units of Cell Saver blood returned  to the patient.   COMPLICATIONS:  Small durotomy, which was repaired with a piece of  Duragen and Tisseel.   INDICATIONS:  [name] is a 55 year old woman with bilateral lower  extremity pain, right greater than left, with severe stenosis and  calcified disk herniations at L2-3, L3-4 levels with marked stenosis and  bilateral lateral recess and foraminal stenosis.  It was elected take  her to surgery for decompression and fusion at these affected levels.   PROCEDURE:  [name] was brought to the operating room.  Following the  satisfactory and uncomplicated option of general endotracheal anesthesia  and placement of intravenous lines and a Foley catheter, the patient was  placed in a prone position on the  Dunbar table.  Her low back was  prepped and draped in the usual sterile fashion.  The area of planned  incision was inserted with 0.25% Marcaine and 0.5% lidocaine and  1:200,000 epinephrine.  Incision was made in the midline, carried  through copious adipose tissue to the lumbar dorsal fascia, which was  incised bilaterally.  Subperiosteal dissection was performed exposing  the L2, L3, L4 and L5 transverse processes and a self-retaining  retractor was placed to facilitate exposure.  Intraoperative x-ray  confirmed correct orientation and subsequently the laminae and pars of  L2, L3 and L4 were then drilled bilaterally with decompression of thecal  sac.  there was significant bilateral lateral recess stenosis at L4-5  and at L3-4 there was a densely calcified disk herniation with  significant compression and adhesion to the thecal sac, particularly on  the left side of midline.  At L2-3 there was a similar disk herniation  with calcification.  The interspaces were incised and the calcified disk  material was removed.  Each level was decompressed.  A small durotomy  was created on the left during the effort to  detach the adherent dura  from the calcified disk material. Initially it was planned on suturing  this small durotomy; however, it was not possible to do so as it was  quite lateral.  Efforts were made under the operating microscope to  define the edges of the dural defect and to repair it.  When this was  not possible, it was felt instead that the most appropriate treatment  would be to place a piece of Duragen along the inferior aspect of the  thecal sac.  This was placed along its entirety from right to left after  10-mm PEEK interbody cages were packed with BMP, Fortross, and inserted  in the interspace and countersunk appropriately.  There did not appear  to be any remaining leakage of CSF.  Also at the end of the surgery  after screws were attached to rods,  a Tisseel was  placed overlying the  L3-4 laminectomy defects bilaterally.  Subsequently 8-mm PEEK interbody  cages were placed bilaterally at L2-3 and at L4-5 with BMP, Fortross and  bone autograft.  Then pedicle screws were placed bilaterally using 5.5 x  45-mm screws with the exception of the right-most L5 screw, which was a  40-mm screw.  All screws had excellent purchase and their positioning  was confirmed on AP and lateral fluoroscopy.  An 80-mm rod was placed on  the right and a 90-mm rod was placed on the left.  The posterolateral  region was decorticated with high-speed drill and the remaining bone  autograft, Fortross and BMP was placed on the left and Fortross and  autograft were placed on the right.  Hemostasis was assured.  The self-  retaining tractor was removed.  The lumbar dorsal fascia was closed with  #1 Vicryl suture.  Subcutaneous tissues were approximated with 2-0  Vicryl interrupted, inverted sutures and the skin edges were  approximated with interrupted 2-0 and 3-0 Vicryl subcuticular stitch.  The wound was dressed with Benzoin, Steri-Strips, Telfa gauze and tape.  The patient was extubated in the operating room and taken to recovery in  stable, satisfactory condition, having tolerated her operation well.  Counts were correct at the end of the case.      Danae Orleans. Venetia Maxon, M.D.  Electronically Signed     JDS/MEDQ  D:  08/31/2007  T:  08/31/2007  Job:  [id number]

## 2010-11-01 NOTE — Op Note (Signed)
NAMECANA, MIGNANO NO.:  [id number]   MEDICAL RECORD NO.:  [id number]          PATIENT TYPE:  INP   LOCATION:  3034                         FACILITY:  [facility]   PHYSICIAN:  Danae Orleans. Venetia Maxon, M.D.  DATE OF BIRTH:  1955/08/10   DATE OF PROCEDURE:  10/24/2005  DATE OF DISCHARGE:                                 OPERATIVE REPORT   PREOPERATIVE DIAGNOSIS:  Herniated cervical disk plus degenerative disk  disease, spondylosis and radiculopathy at C5-6 and C6-7 levels.   POSTOPERATIVE DIAGNOSIS:  Herniated cervical disk plus degenerative disk  disease, spondylosis and radiculopathy at C5-6 and C6-7 levels.   PROCEDURE:  Anterior cervical decompression and fusion, C5-6 and C6-7  levels, with PEEK interbody cages, morcelized autograft and anterior  cervical plate.   SURGEON:  Danae Orleans. Venetia Maxon, M.D.   ASSISTANT:  Clydene Fake, M.D.   ANESTHESIA:  General.   ANESTHESIA:  Endotracheal anesthesia.   ESTIMATED BLOOD LOSS:  Minimal.   COMPLICATIONS:  None.   DISPOSITION:  To Recovery.   INDICATIONS:  [name] is a 55 year old woman with significant neck  pain and right-greater-than-left upper extremity pain with cervical  radiculopathy with herniated disk and degenerative disk disease at C5-6 and  C6-7 levels.  It was elected to take her to surgery for anterior cervical  decompression and fusion at these affected levels.   PROCEDURE:  Ms. Akridge was brought to the operating room.  Following the  satisfactory and uncomplicated induction of general endotracheal anesthesia  and placement of intravenous lines, the patient was placed in a supine  position on the operating table with her neck in slight extension on a  horseshoe headholder and 10 pounds of halter traction.  Her anterior neck  was then prepped and draped in the usual sterile fashion.  Incision was made  from the midline to the anterior border of the sternocleidomastoid muscle  and the left side of  midline after infiltrating skin and subcutaneous  tissues with 0.25% Marcaine and 0.5% lidocaine and 1:200,000 epinephrine.  This incision was carried through the platysmal layer and then blunt  dissection was performed along the anterior border of the  sternocleidomastoid muscle, keeping the carotid sheath lateral trachea and  esophagus medial, exposing the anterior cervical spine.  Bent spinal needles  were placed at what we felt to be the C4-5 and C5-6 levels; because of the  patient's large body habitus, I was not certain that I would be able to  visualize the C5-6 level.  Intraoperative x-ray confirmed marker probes at  C4-5 and C5-6 levels.  Subsequently, the longus colli muscles were taken  down from the anterior cervical spine from C5 through the C7 levels  bilaterally using electrocautery and Key elevator and a self-retaining  Shadowline retractor along with upward and downward retractors which were  placed to facilitate exposure.  Large ventral osteophytes at C5-6 were  removed with  Leksell rongeur.  The disk spaces at C5-6 and C6-7 were then  incised with a 15 blade and disk material was removed in a piecemeal  fashion.  Initially at the  C6-7 level, the disk spreader was placed and  microscope was brought into the field and using high-powered microscopic  visualization and high-speed drill with a suction trap to save all bone  autograft from the drillings, the endplates of C6 and C7 were decorticated  and uncinate spurs were drilled down.  The posterior longitudinal ligament  was then incised with an arachnoid knife and removed in piecemeal fashion  using 2-mm gold-tipped Kerrison rongeurs.  Multiple fragments of herniated  disk material were removed from the right C6-7 neural foramen with  significant resultant decompression of the C7 nerve root.  Both neural  foramina were decompressed, as was the central spinal cord dura.  After  assurance of adequate hemostasis using Gelfoam  soaked in thrombin, a 6-mm  PEEK interbody cage was selected, packed with morcelized bone autograft and  inserted in the interspace and countersunk appropriately.  Subsequently, a  similar decompression was performed at the C5-6 level and uncinate spurs  were drilled down.  The spinal cord dura and both C6 nerve roots were  decompressed and hemostasis was again assured.  A similarly sized PEEK  interbody cage was selected, packed with morcelized bone autograft, inserted  in the interspace and countersunk appropriately.  Following this, the  traction weight was removed and a 34-mm anterior cervical plate was affixed  to the anterior cervical spine using 42-mm variable-angle screw, 2 at C5, 2  at C6 and 2 at C7.  All screws had excellent purchase.  Locking mechanisms  were engaged.  Final x-ray demonstrated the upper aspect of the construct,  but it was not possible to visualize the lower aspect because of the  patient's large body habitus.  Hemostasis was again assured.  Soft tissues  were inspected and found to be of good repair and the wound was then closed  with 3-0 Vicryl sutures at the platysmal layer and 3-0 Vicryl subcuticular  stitch.  The wound was dressed with Dermabond.  The patient was extubated in  the operating room and taken to the recovery room in stable and satisfactory  condition, having tolerated her operation well.  Counts were correct at the  end of the case.      Danae Orleans. Venetia Maxon, M.D.  Electronically Signed     JDS/MEDQ  D:  10/24/2005  T:  10/25/2005  Job:  [id number]

## 2010-11-01 NOTE — Op Note (Signed)
[name], [name]               ACCOUNT NO.:  [id number]   MEDICAL RECORD NO.:  [id number]          PATIENT TYPE:  AMB   LOCATION:  DSC                          FACILITY:  [facility]   PHYSICIAN:  Katy Fitch. Sypher Montez Hageman., M.D.DATE OF BIRTH:  14-May-1956   DATE OF PROCEDURE:  03/19/2004  DATE OF DISCHARGE:                                 OPERATIVE REPORT   PREOPERATIVE DIAGNOSIS:  1.  Chronic stage II impingement right shoulder with plain film and MRI      evidence of significant supraspinatus rotator cuff tendonopathy.  2.  Acromioclavicular degenerative arthritis.  3.  Rule out partial thickness rotator cuff tear.  4.  Right carpal tunnel syndrome confirmed by clinical examination and      electrodiagnostic studies.   POSTOPERATIVE DIAGNOSIS:  1.  Chronic stage II impingement right shoulder with plain film and MRI      evidence of significant supraspinatus rotator cuff tendonopathy.  2.  Acromioclavicular degenerative arthritis.  3.  Identification of a 40% deep surface rotator cuff tear of the      supraspinatus and evidence of chronic stage II/III impingement with a      very thick coracoacromial ligament and prominent anterior acromion.  4.  Right carpal tunnel syndrome confirmed by clinical examination and      electrodiagnostic studies.   OPERATION:  1.  Diagnostic arthroscopy right glenohumeral joint followed by arthroscopic      debridement of deep surface supraspinatus rotator cuff tear.  2.  Arthroscopic subacromial decompression with coracoacromial ligament      release, acromioplasty, and bursectomy.  3.  Open distal clavicle resection, i.e. Mumford procedure.  4.  Through a separate incision distally in the arm, right carpal tunnel      release.   SURGEON:  Katy Fitch. Sypher, M.D.   ASSISTANT:  Marveen Reeks. Dasnoit, P.A.-C.   ANESTHESIA:  General endotracheal anesthesia, no interscalene block was  provided.   ANESTHESIOLOGIST:  Zenon Mayo, M.D.   ESTIMATED  BLOOD LOSS:  50 mL.   COMPLICATIONS:  None.   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  Clinical examination  suggested signs of significant right shoulder impingement and a probable  partial thickness rotator cuff tear.  She was also noted to have signs of  bilateral carpal tunnel syndrome and is status post release of her left  transverse carpal ligament approximately one month prior with a very  satisfactory outcome.  Due to persistent pain in her shoulder and right hand  numbness, she requests that we proceed with intervention at her shoulder at  this time.  Preoperatively, her MRI had suggested stage II to III  impingement with a possible partial or full thickness rotator cuff tear.  We  recommended diagnostic arthroscopy followed by subacromial decompression,  distal clavicle resection, and repair of the rotator cuff as needed.   PROCEDURE:  [name] is brought to the operating room and placed on  supine position on the operating table.  Following informed consent with Dr.  Sampson Goon, general anesthesia was induced with endotracheal technique.  She  was carefully positioned in the beach chair position with the aid of a torso  and head holder designed for shoulder arthroscopy.  The entire right upper  extremity and four quarter were prepped with DuraPrep followed by draping  with impervious arthroscopy drapes.  The procedure commenced with carpal  tunnel release.  The hand was exsanguinated with an Esmarch bandage which  was used on the proximal forearm as a distal arm tourniquet.  The procedure  commenced with a short incision in the line of the ring finger in the palm.  The subcutaneous tissues were carefully divided revealing the palmar fascia.  This was split longitudinally through the common sensory branches of  the  median nerve.  These  were followed back to the transverse carpal ligament  which was carefully isolated from the median nerve.  The ligament was  released along its ulnar border extending into the distal forearm.  This  widely opened the carpal canal.  The bursa was noted to be extremely  hypertrophic.  There were no masses or other predicaments noted.  Bleeding  points along the margins of the released ligament were electrocauterized  with bipolar current followed by repair of the skin with intradermal 3-0  Prolene suture.  A Tegaderm dressing was applied to the hand and, later, a  protective splint will be applied after the shoulder surgery is completed.   A new sterile stockinette was applied to the hand with Coban followed by  routine draping for shoulder surgery with the right arm free for examination  under anesthesia.  The shoulder was distended with 20 mL of sterile saline  placed with a 20 gauge spinal needle through the anticipated posterior  portal.  The scope was placed atraumatically with blunt technique.  Diagnostic arthroscopy revealed intact hyaline articular cartilage surfaces  on the humeral head and glenoid.  The labrum was intact.  The biceps origin  was stable.  The biceps tendon was normal to the rotator interval.  The deep  surface of the subscapularis appeared normal.  The supraspinatus was notable  for a deep surface tear with fragments free within the joint.  The  infraspinatus and teres minor appeared normal.  An anterior portal was  created and a 4.5 mm suction shaver was placed and used to debride the free  margins and necrotic portions of the supraspinatus rotator cuff tear.  This  was debrided to a stable rim of tendon.  This appeared to have approximately  40% of the thickness of the rotator cuff supraspinatus tendon.  No other  glenohumeral pathology was noted, therefore, the scope was removed and placed in the subacromial space.  There is a florid  bursitis noted.   The coracoacromial ligament was identified and released with the cutting  cautery.  A portion of the ligament was resected.  The capsule of the [facility]  joint was taken down and a prominent posterior bone spur noted.  The  anterior morphology of the acromion revealed a rather large anterior spike  bone spur that had been detached from the point of the coracoacromial  ligament.  The acromion was leveled to a type 2 morphology with a bur  followed by use of the suction shaver to remove all remnants of the  coracoacromial ligament.  Bleeding was controlled with bipolar cautery.  A  hand rasp was used to  smooth the margins of the resection.  The scope was  then removed and we proceeded with resection of the distal clavicle through  a 2 cm incision centered directly over the distal clavicle superiorly.  The  distal clavicle was exposed subperiosteally with a 15 scalpel blade and  small osteotome followed by the use of an oscillating saw to remove the  distal 15 mm of clavicle.  The space created by removal of the distal  clavicle was closed with repair of the anterior deltoid to the trapezius  muscles utilizing mattress suture of #2 FiberWire.  The wound was then  repaired with subdermal sutures of 2-0 Vicryl and intradermal 3-0 Prolene  with Steri-Strips.  The scope was replaced and  clot was removed from the  subacromial space.  There was no sign of a full thickness rotator cuff tear  noted.  For final diagnosis, it appeared that [name] had a painful  tendonopathy of the supraspinatus with a partial thickness tear.  This was  addressed by decompression and distal clavicle resection.  While there are  no guarantees that she will not progress to a full thickness tear due to  tendon degeneration, in general, our experience has been quite favorable  with proper decompression.   For aftercare, she will be admitted to the recovery care center for  observation of her vital signs,  antibiotics in the form of IV vancomycin,  and appropriate analgesics initially utilizing IV Dilaudid and later p.o.  Dilaudid.       RVS/MEDQ  D:  03/19/2004  T:  03/19/2004  Job:  [id number]

## 2010-11-01 NOTE — Op Note (Signed)
NAME:  [name], [name]                         ACCOUNT NO.:  [id number]   MEDICAL RECORD NO.:  [id number]                   PATIENT TYPE:  AMB   LOCATION:  DSC                                  FACILITY:  [facility]   PHYSICIAN:  Katy Fitch. Naaman Plummer., M.D.          DATE OF BIRTH:  1956-01-05   DATE OF PROCEDURE:  02/27/2004  DATE OF DISCHARGE:                                 OPERATIVE REPORT   PREOPERATIVE DIAGNOSIS:  Entrapped neuropathy median nerve, left carpal  tunnel.   POSTOPERATIVE DIAGNOSIS:  Entrapped neuropathy median nerve, left carpal  tunnel.   OPERATION/PROCEDURE:  Release of left transverse carpal ligament.   OPERATING SURGEON:  Katy Fitch. Sypher, M.D.   ASSISTANT:  Marveen Reeks. Dasnoit, P.A.-C.   ANESTHESIA:  General by LMA.   SUPERVISING ANESTHESIOLOGIST:  Dr. Gelene Mink.   INDICATIONS:  [name] is a 55 year old woman with bilateral hand  numbness and right shoulder pain.  Clinical examination has confirmed the  diagnoses of AC arthropathy and impingement as well as bilateral carpal  tunnel syndrome.   She has a history of non-insulin dependent diabetes mellitus and high blood  pressure.   Due to a failure to respond to nonoperative measures, she is brought to the  operating room at this time for release of her left transverse carpal  ligament.   PROCEDURE:  [name] is brought to the operating room and placed in the  supine position on the operating table.  Following induction of general  anesthesia by LMA, the left arm was prepped with Betadine soap and solution  and sterilely draped.  Following exsanguination of the limb with an Esmarch  bandage, the arterial tourniquet on the proximal brachium was inflated to  220 mmHg.  The procedure commenced with a short incision in the line of the  ring finger in the palm.  The subcutaneous tissues were carefully divided  down to the palmar fascia.  This was split longitudinally to encompass the  branch of the  median nerve.  These were followed back to the transverse  carpal ligament which was carefully isolated from the median nerve.  The  ligament was released along the ulnar border of the ligament, extending into  the distal forearm.  This widely opened the carpal canal.  No masses or  other predicaments were noted.   The wound was then repaired with intradermal 3-0 Prolene suture.   A compressive dressing was applied with a volar plaster splint with the  fingers in 5 degrees of dorsiflexion.                                              Katy Fitch Naaman Plummer., M.D.   RVS/MEDQ  D:  02/27/2004  T:  02/27/2004  Job:  [id number]

## 2010-11-01 NOTE — Procedures (Signed)
NAMEDARNETTA, KESSELMAN               ACCOUNT NO.:  [id number]   MEDICAL RECORD NO.:  [id number]          PATIENT TYPE:  OUT   LOCATION:  RAD                           FACILITY:  [facility]   PHYSICIAN:  Dani Gobble, MD       DATE OF BIRTH:  05/06/1956   DATE OF PROCEDURE:  12/02/2005  DATE OF DISCHARGE:                                  ECHOCARDIOGRAM   REFERRING:  Dr. Phillips Odor and Dr. Domingo Sep.   INDICATIONS:  A 55 year old female past medical history of hypertension,  diabetes referred for lower extremity edema.   Technical quality of this study is limited secondary to patient body habitus  and poor acoustic windows, particularly from the apical views.   The aorta measures normally at 3.1 cm.   The left atrium measures normally at 3.4 cm.  The patient appeared to be in  sinus rhythm during the procedure.   The intraventricular septum was mildly thickened, measured at 1.3 cm while  the posterior wall is within normal limits at 1.1 cm.   The aortic valve was not well visualized.  No obvious aortic insufficiency  is noted.  Doppler interrogation of the aortic valve was somewhat suboptimal  due to the technical limitations noted above.   The mitral valve appears grossly structurally normal.  No mitral valve  prolapse is noted.  Mild mitral regurgitation was noted.  Doppler  interrogation of the mitral valve was within normal limits.  There is a  small mobile echodensity just below the mitral valve likely representing a  lax chordae or a torn minor subvalvular apparatus.   Pulmonic valve was not well visualized.   Tricuspid valve was poorly visualized as well.   The left ventricle was normal in size with LV/IDD measured at 4.4 cm and  LV/IC was measured at 3.2 cm.  Overall left ventricular systolic function  appeared to be low normal to mildly reduced.  This study was not adequate  for assessment of definitive regional wall motion abnormalities.  In one  view, the mid to distal  anteroseptal wall appeared mildly hypokinetic but  this was not well visualized.   The right-sided structures were also poorly visualized.   IMPRESSION:  1.  Technical difficult study secondary to patient body habitus and poor      acoustic windows.  2.  Mild asymmetric septal hypertrophy.  3.  Mild mitral regurgitation.  4.  Small mobile echodensity noted just below the mitral valve which likely      represents a lax chordae or a torn minor subvalvular apparatus.  5.  Grossly normal left ventricular size with low normal to mildly reduced      ejection fraction.  This study is not adequate for confirmation of      regional wall motion abnormalities, although in one view there was a      subtle suggestion of mid to distal anteroseptal wall mild hypokinesis      but again this was not confirmed.           ______________________________  Dani Gobble, MD     AB/MEDQ  D:  12/02/2005  T:  12/02/2005  Job:  [id number]   cc:   Corrie Mckusick, M.D.  Fax: [phone]

## 2010-12-26 ENCOUNTER — Encounter: Payer: Self-pay | Admitting: Cardiovascular Disease

## 2011-01-02 ENCOUNTER — Encounter: Payer: Self-pay | Admitting: Cardiovascular Disease

## 2011-01-02 ENCOUNTER — Ambulatory Visit (INDEPENDENT_AMBULATORY_CARE_PROVIDER_SITE_OTHER): Payer: Medicare Other | Admitting: Cardiovascular Disease

## 2011-01-02 ENCOUNTER — Other Ambulatory Visit: Payer: Self-pay | Admitting: *Deleted

## 2011-01-02 DIAGNOSIS — R609 Edema, unspecified: Secondary | ICD-10-CM | POA: Insufficient documentation

## 2011-01-02 DIAGNOSIS — R079 Chest pain, unspecified: Secondary | ICD-10-CM | POA: Insufficient documentation

## 2011-01-02 NOTE — Patient Instructions (Addendum)
Your physician has requested that you have a lexiscan myoview. For further information please visit [email or web address] Please follow instruction sheet, as given.  Your physician recommends that you continue on your current medications as directed. Please refer to the Current Medication list given to you today.  Your physician recommends that you schedule a follow-up appointment in: as needed, we will contact you with test results.

## 2011-01-02 NOTE — Progress Notes (Signed)
55 yo referred by Dr Sherwood Gambler for chest pain.  No previously documented disease.  Normal echo 2007.  Activity limited by severe back and cervical spine disease with multiple previous surgeries.  At beach a few weeks ago had resting pressure in chest lasting 2 minutes.  Eventually eased off.  No recurrence.  No associated diaphoresis, palpitatons, dyspnea or syncope.  Has not had pain like that before.  In office today had mild pain with deep inspiration.  No cough sputum or fever.    ROS: Denies fever, malais, weight loss, blurry vision, decreased visual acuity, cough, sputum, SOB, hemoptysis, pleuritic pain, palpitaitons, heartburn, abdominal pain, melena, lower extremity edema, claudication, or rash.  All other systems reviewed and negative  Blood pressure 120/80, pulse 86, height 5\' 2"  (1.575 m), weight 252 lb (114.306 kg).   General: Affect appropriate Healthy:  appears stated age Obese HEENT: normal Neck supple with no adenopathy JVP normal no bruits no thyromegaly Lungs clear with no wheezing and good diaphragmatic motion Heart:  S1/S2 no murmur,rub, gallop or click PMI normal Abdomen: benighn, BS positve, no tenderness, no AAA no bruit.  No HSM or HJR Distal pulses intact with no bruits Plus one bilateral edema Neuro non-focal Skin warm and dry No muscular weakness  Medications Current Outpatient Prescriptions  Medication Sig Dispense Refill  . ALPRAZolam (XANAX) 1 MG tablet Take 1 mg by mouth at bedtime as needed.        Marland Kitchen aspirin 81 MG tablet Take 81 mg by mouth daily.        Marland Kitchen atenolol (TENORMIN) 25 MG tablet Take 25 mg by mouth daily.       Marland Kitchen docusate sodium (COLACE) 100 MG capsule Take 100 mg by mouth 2 (two) times daily.        Marland Kitchen estradiol (ESTRACE) 0.5 MG tablet Take 0.5 mg by mouth daily.        . furosemide (LASIX) 20 MG tablet Take 20 mg by mouth 2 (two) times daily.       Marland Kitchen glipiZIDE (GLUCOTROL XL) 10 MG 24 hr tablet Take 10 mg by mouth daily.        Marland Kitchen lidocaine  (LIDODERM) 5 % Place 1 patch onto the skin every 12 (twelve) hours. Remove & Discard patch within 12 hours or as directed by MD       . losartan (COZAAR) 50 MG tablet Take 50 mg by mouth daily.       Marland Kitchen morphine (MS CONTIN) 15 MG 12 hr tablet Take 15 mg by mouth 3 (three) times daily as needed.        Marland Kitchen oxycodone (ROXICODONE) 30 MG immediate release tablet Take 30 mg by mouth every 6 (six) hours as needed.       . pioglitazone-metformin (ACTOPLUS MET) 15-500 MG per tablet Take 1 tablet by mouth 2 (two) times daily with a meal.        . pregabalin (LYRICA) 75 MG capsule Take 75 mg by mouth 3 (three) times daily.        . promethazine (PHENERGAN) 25 MG tablet Take 25 mg by mouth every 6 (six) hours as needed.          Allergies Codeine and Sulfonamide derivatives  Family History: No family history on file.  Social History: History   Social History  . Marital Status: Married    Spouse Name: N/A    Number of Children: N/A  . Years of Education: N/A   Occupational History  .  Not on file.   Social History Main Topics  . Smoking status: Never Smoker   . Smokeless tobacco: Never Used  . Alcohol Use: No  . Drug Use: No  . Sexually Active: Not on file   Other Topics Concern  . Not on file   Social History Narrative  . 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

## 2011-01-02 NOTE — Assessment & Plan Note (Signed)
Dependant edema from obesity and venous insuf.  Low sodium diet elevate legs at end of day and continue Lasix

## 2011-01-02 NOTE — Assessment & Plan Note (Signed)
Atypical.  Obese HTN with positive family history.  Abnormal ECG  F/U lexiscan myovue.  Unable to walk due to chronic pain and back problems

## 2011-01-03 ENCOUNTER — Encounter: Payer: Self-pay | Admitting: *Deleted

## 2011-01-09 ENCOUNTER — Encounter (HOSPITAL_COMMUNITY)
Admission: RE | Admit: 2011-01-09 | Discharge: 2011-01-09 | Disposition: A | Discharge status: Home / Self Care | Payer: Medicare Other | Source: Ambulatory Visit | Attending: Cardiovascular Disease | Admitting: Cardiovascular Disease

## 2011-01-09 ENCOUNTER — Encounter (HOSPITAL_COMMUNITY): Payer: Self-pay

## 2011-01-09 ENCOUNTER — Encounter (HOSPITAL_COMMUNITY): Payer: Self-pay | Admitting: Cardiology

## 2011-01-09 ENCOUNTER — Ambulatory Visit (INDEPENDENT_AMBULATORY_CARE_PROVIDER_SITE_OTHER): Payer: Medicare Other | Admitting: *Deleted

## 2011-01-09 DIAGNOSIS — R079 Chest pain, unspecified: Secondary | ICD-10-CM

## 2011-01-09 DIAGNOSIS — E119 Type 2 diabetes mellitus without complications: Secondary | ICD-10-CM | POA: Insufficient documentation

## 2011-01-09 DIAGNOSIS — R0789 Other chest pain: Secondary | ICD-10-CM | POA: Insufficient documentation

## 2011-01-09 DIAGNOSIS — I10 Essential (primary) hypertension: Secondary | ICD-10-CM | POA: Insufficient documentation

## 2011-01-09 HISTORY — DX: Essential (primary) hypertension: I10

## 2011-01-09 MED ORDER — TECHNETIUM TC 99M TETROFOSMIN IV KIT
10.0000 | PACK | Freq: Once | INTRAVENOUS | Status: AC | PRN
  Administered 2011-01-09: 10 via INTRAVENOUS

## 2011-01-09 MED ORDER — TECHNETIUM TC 99M TETROFOSMIN IV KIT
30.0000 | PACK | Freq: Once | INTRAVENOUS | Status: AC | PRN
  Administered 2011-01-09: 28.5 via INTRAVENOUS

## 2011-01-09 NOTE — Progress Notes (Signed)
Stress Lab Nurses Notes - [facility]  [name] 01/09/2011  Reason for doing test: Chest Pain  Type of test: Steffanie Dunn  Nurse performing test: Marlena Clipper, RN  Nuclear Medicine Tech: Lyndel Pleasure  Echo Tech: Not Applicable  MD performing test: Orion Modest  Family MD: Dr. Sherwood Gambler  Test explained and consent signed: yes  IV started: 22g jelco, Saline lock flushed, No redness or edema and Saline lock started in radiology  Symptoms: Heavy feeling in chest  Treatment/Intervention: None  Reason test stopped: protocol completed  After recovery IV was: Discontinued via X-ray tech and No redness or edema  Patient to return to Nuc. Med at :12:45  Patient discharged: Home  Patient's Condition upon discharge was: stable  Comments: Symptoms resolved in Recovery  Angelica Pou

## 2011-01-14 ENCOUNTER — Telehealth: Payer: Self-pay | Admitting: Cardiovascular Disease

## 2011-01-14 NOTE — Telephone Encounter (Signed)
Spoke with pt, aware of normal stress test results [name]

## 2011-01-14 NOTE — Telephone Encounter (Signed)
Returning [place] call from yesterday.

## 2011-03-10 LAB — WOUND CULTURE

## 2011-03-10 LAB — CBC
HCT: 29.7 — ABNORMAL LOW
HCT: 33.8 — ABNORMAL LOW
Hemoglobin: 11.6 — ABNORMAL LOW
MCHC: 34.2
MCHC: 34.4
MCHC: 34.5
MCV: 90
Platelets: 291
Platelets: 304
RBC: 4.88
RBC: 3.76 — ABNORMAL LOW
RDW: 14.1
RDW: 14.3
RDW: 14.4
RDW: 14.6
WBC: 9.1
WBC: 11 — ABNORMAL HIGH

## 2011-03-10 LAB — BASIC METABOLIC PANEL
BUN: 5 — ABNORMAL LOW
BUN: 7
CO2: 30
CO2: 32
Calcium: 9.8
Calcium: 8.4
Calcium: 8.6
Chloride: 103
Chloride: 94 — ABNORMAL LOW
Creatinine, Ser: 1.06
GFR calc Af Amer: >60
GFR calc non Af Amer: >60
GFR calc non Af Amer: >60
Glucose, Bld: 106 — ABNORMAL HIGH
Glucose, Bld: 153 — ABNORMAL HIGH
Potassium: 3.6
Potassium: 3.7
Sodium: 133 — ABNORMAL LOW
Sodium: 135

## 2011-03-10 LAB — URINE MICROSCOPIC-ADD ON

## 2011-03-10 LAB — DIFFERENTIAL
Eosinophils Absolute: 0
Lymphs Abs: 1.8
Monocytes Relative: 6
Neutrophils Relative %: 81 — ABNORMAL HIGH

## 2011-03-10 LAB — COMPREHENSIVE METABOLIC PANEL
ALT: 36 — ABNORMAL HIGH
AST: 25
Calcium: 9.1
GFR calc Af Amer: >60
Sodium: 134 — ABNORMAL LOW
Total Protein: 6.2

## 2011-03-10 LAB — URINE CULTURE

## 2011-03-10 LAB — CULTURE, BLOOD (ROUTINE X 2): Culture: NO GROWTH

## 2011-03-10 LAB — TYPE AND SCREEN
ABO/RH(D): A POS
Antibody Screen: NEGATIVE

## 2011-03-10 LAB — POCT I-STAT 4, (NA,K, GLUC, HGB,HCT)
Glucose, Bld: 176 — ABNORMAL HIGH
HCT: 33 — ABNORMAL LOW
Operator id: 151301
Sodium: 137

## 2011-03-10 LAB — URINALYSIS, ROUTINE W REFLEX MICROSCOPIC
Glucose, UA: NEGATIVE
Hgb urine dipstick: NEGATIVE
Protein, ur: NEGATIVE
pH: 6.5

## 2011-03-10 LAB — ABO/RH: ABO/RH(D): A POS

## 2011-03-11 LAB — COMPREHENSIVE METABOLIC PANEL
ALT: 31
AST: 23
Albumin: 2.3 — ABNORMAL LOW
Alkaline Phosphatase: 54
BUN: 5 — ABNORMAL LOW
Chloride: 99
GFR calc Af Amer: >60
Potassium: 3.6
Sodium: 137
Total Bilirubin: 0.7
Total Protein: 5.7 — ABNORMAL LOW

## 2011-03-11 LAB — BASIC METABOLIC PANEL
BUN: 6
CO2: 31
CO2: 32
Calcium: 8.7
Calcium: 9
Chloride: 99
Creatinine, Ser: 0.64
Creatinine, Ser: 0.69
GFR calc Af Amer: >60
GFR calc Af Amer: >60
GFR calc Af Amer: >60
GFR calc non Af Amer: >60
GFR calc non Af Amer: >60
GFR calc non Af Amer: >60
Glucose, Bld: 108 — ABNORMAL HIGH
Glucose, Bld: 122 — ABNORMAL HIGH
Potassium: 3.7
Potassium: 3.9
Sodium: 137
Sodium: 138
Sodium: 138
Sodium: 140

## 2011-03-11 LAB — CBC
HCT: 26.9 — ABNORMAL LOW
HCT: 28.5 — ABNORMAL LOW
HCT: 29.9 — ABNORMAL LOW
Hemoglobin: 9.2 — ABNORMAL LOW
Hemoglobin: 9.5 — ABNORMAL LOW
Hemoglobin: 9.6 — ABNORMAL LOW
MCHC: 33.9
MCHC: 34.7
MCV: 89.4
Platelets: 375
Platelets: 377
RBC: 3.08 — ABNORMAL LOW
RBC: 3.21 — ABNORMAL LOW
RDW: 14.5
RDW: 14.5
RDW: 14.8
WBC: 7.5
WBC: 8.7
WBC: 8.8

## 2011-03-11 LAB — DIFFERENTIAL
Basophils Relative: 1
Eosinophils Absolute: 0.2
Lymphs Abs: 2.1
Monocytes Absolute: 1
Monocytes Relative: 12
Neutrophils Relative %: 61

## 2011-03-11 LAB — VANCOMYCIN, TROUGH: Vancomycin Tr: 15.9

## 2011-03-17 LAB — URINALYSIS, ROUTINE W REFLEX MICROSCOPIC
Bilirubin Urine: NEGATIVE
Glucose, UA: NEGATIVE
Hgb urine dipstick: NEGATIVE
Specific Gravity, Urine: >1.03 — ABNORMAL HIGH
pH: 5

## 2011-03-18 LAB — CREATININE, SERUM
Creatinine, Ser: 0.91
GFR calc Af Amer: >60
GFR calc non Af Amer: >60

## 2011-03-31 LAB — BASIC METABOLIC PANEL
Calcium: 9.6
Creatinine, Ser: 0.9
GFR calc Af Amer: >60
GFR calc non Af Amer: >60
Sodium: 133 — ABNORMAL LOW

## 2011-08-06 ENCOUNTER — Other Ambulatory Visit (HOSPITAL_COMMUNITY): Payer: Self-pay | Admitting: Internal Medicine

## 2011-08-06 ENCOUNTER — Ambulatory Visit (HOSPITAL_COMMUNITY)
Admission: RE | Admit: 2011-08-06 | Discharge: 2011-08-06 | Disposition: A | Discharge status: Home / Self Care | Payer: Medicare Other | Source: Ambulatory Visit | Attending: Internal Medicine | Admitting: Internal Medicine

## 2011-08-06 DIAGNOSIS — R059 Cough, unspecified: Secondary | ICD-10-CM | POA: Insufficient documentation

## 2011-08-06 DIAGNOSIS — R05 Cough: Secondary | ICD-10-CM | POA: Insufficient documentation

## 2011-08-25 ENCOUNTER — Other Ambulatory Visit (HOSPITAL_COMMUNITY): Payer: Self-pay | Admitting: Internal Medicine

## 2011-08-25 DIAGNOSIS — R1314 Dysphagia, pharyngoesophageal phase: Secondary | ICD-10-CM

## 2011-08-27 ENCOUNTER — Ambulatory Visit (HOSPITAL_COMMUNITY)
Admission: RE | Admit: 2011-08-27 | Discharge: 2011-08-27 | Disposition: A | Discharge status: Home / Self Care | Payer: Medicare Other | Source: Ambulatory Visit | Attending: Internal Medicine | Admitting: Internal Medicine

## 2011-08-27 DIAGNOSIS — K224 Dyskinesia of esophagus: Secondary | ICD-10-CM | POA: Insufficient documentation

## 2011-08-27 DIAGNOSIS — R1314 Dysphagia, pharyngoesophageal phase: Secondary | ICD-10-CM | POA: Insufficient documentation

## 2011-09-03 ENCOUNTER — Ambulatory Visit (INDEPENDENT_AMBULATORY_CARE_PROVIDER_SITE_OTHER): Payer: Medicare Other | Admitting: Internal Medicine

## 2011-09-03 ENCOUNTER — Encounter (INDEPENDENT_AMBULATORY_CARE_PROVIDER_SITE_OTHER): Payer: Self-pay | Admitting: Internal Medicine

## 2011-09-03 ENCOUNTER — Other Ambulatory Visit (INDEPENDENT_AMBULATORY_CARE_PROVIDER_SITE_OTHER): Payer: Self-pay | Admitting: *Deleted

## 2011-09-03 ENCOUNTER — Telehealth (INDEPENDENT_AMBULATORY_CARE_PROVIDER_SITE_OTHER): Payer: Self-pay | Admitting: *Deleted

## 2011-09-03 DIAGNOSIS — R1310 Dysphagia, unspecified: Secondary | ICD-10-CM | POA: Insufficient documentation

## 2011-09-03 DIAGNOSIS — Z1211 Encounter for screening for malignant neoplasm of colon: Secondary | ICD-10-CM

## 2011-09-03 NOTE — Telephone Encounter (Signed)
Patient needs movi prep 

## 2011-09-03 NOTE — Progress Notes (Signed)
Subjective:     Patient ID: [name], female   DOB: 11/04/1955, 56 y.o.   MRN: [id number]  HPI Hillard Danker is a 56 yr old female referred to our office by Dr. Sherwood Gambler for dysphagia. She is having trouble swallowing pills and some foods. Foods such as meat or foods that are bulky. Symptoms started last few months.She underwent a pill esophogram.  Feels like bolus lodges in her uper esophagus. Appetite is good. No weight loss. No abdominal pain.  Occasionally acid reflux.  She usually has a BM x 1 a day with a stool softner.  No melena or bright red rectal bleeding.   She has not ever had a screening colonoscopy  Clinical Data: Dysphagia.  ESOPHOGRAM/BARIUM SWALLOW : 08/25/2011 Technique: Combined double contrast and single contrast  examination performed using effervescent crystals, thick barium  liquid, and thin barium liquid.  Fluoroscopy time: 2.6 minutes.  Comparison: None.  Findings: Fluoroscopic evaluation of swallowing demonstrates  disruption of 3/3 primary esophageal peristaltic waves. No fixed  stricture, fold thickening or mass. No reflux with water siphon  maneuver. The patient swallowed a 13 mm barium tablet which  briefly sits in the distal esophagus before passing into the  stomach.  IMPRESSION:  Esophageal dysmotility. No fixed stricture.   Review of Systems see hpi     Current Outpatient Prescriptions  Medication Sig Dispense Refill  . albuterol (PROVENTIL) 2 MG tablet Take 2 mg by mouth 2 (two) times daily before a meal.      . ALPRAZolam (XANAX) 1 MG tablet Take 1 mg by mouth at bedtime as needed.        Marland Kitchen atenolol (TENORMIN) 25 MG tablet Take 25 mg by mouth daily.       Marland Kitchen atenolol (TENORMIN) 25 MG tablet Take 25 mg by mouth daily.      Jennette Banker Sodium 30-100 MG CAPS Take by mouth.      . docusate sodium (COLACE) 100 MG capsule Take 100 mg by mouth 2 (two) times daily.        Marland Kitchen estradiol (ESTRACE) 0.5 MG tablet Take 0.5 mg by mouth daily.        .  furosemide (LASIX) 20 MG tablet Take 20 mg by mouth 2 (two) times daily.       Marland Kitchen glipiZIDE (GLUCOTROL XL) 10 MG 24 hr tablet Take 10 mg by mouth daily.        Marland Kitchen losartan (COZAAR) 50 MG tablet Take 50 mg by mouth daily.       Marland Kitchen oxycodone (ROXICODONE) 30 MG immediate release tablet Take 30 mg by mouth every 6 (six) hours as needed.       . pioglitazone-metformin (ACTOPLUS MET) 15-500 MG per tablet Take 1 tablet by mouth 2 (two) times daily with a meal.        . pregabalin (LYRICA) 75 MG capsule Take 75 mg by mouth 3 (three) times daily.        . promethazine (PHENERGAN) 25 MG tablet Take 25 mg by mouth every 6 (six) hours as needed.        Marland Kitchen aspirin 81 MG tablet Take 81 mg by mouth daily.        Marland Kitchen lidocaine (LIDODERM) 5 % Place 1 patch onto the skin every 12 (twelve) hours. Remove & Discard patch within 12 hours or as directed by MD       . morphine (MS CONTIN) 15 MG 12 hr tablet Take 15 mg by mouth 3 (  three) times daily as needed.        . pioglitazone-metformin (ACTOPLUS MET) 15-500 MG per tablet Take 1 tablet by mouth 2 (two) times daily with a meal.       Past Medical History  Diagnosis Date  . Diabetes mellitus   . Hypertension    Past Surgical History  Procedure Date  . Back surgery 4 yrs ago    History   Social History  . Marital Status: Married    Spouse Name: N/A    Number of Children: N/A  . Years of Education: N/A   Occupational History  . Not on file.   Social History Main Topics  . Smoking status: Never Smoker   . Smokeless tobacco: Never Used  . Alcohol Use: No  . Drug Use: No  . Sexually Active: Not on file   Other Topics Concern  . Not on file   Social History Narrative  . No narrative on file   Family Status  Relation Status Death Age  . Mother Deceased     CAD  . Father Deceased     Lung cancer  . Sister Deceased     unknown, MI, Two in fair health  . Brother Alive     good health   Allergies  Allergen Reactions  . Augmentin   . Codeine      REACTION: itch  . Sulfonamide Derivatives     REACTION: burning sensation    Objective:   Physical Exam Filed Vitals:   09/03/11 1510  Height: 5\' 5"  (1.651 m)  Weight: 252 lb 4.8 oz (114.443 kg)  Alert and oriented. Skin warm and dry. Oral mucosa is moist.   . Sclera anicteric, conjunctivae is pink. Thyroid not enlarged. No cervical lymphadenopathy. Lungs clear. Heart regular rate and rhythm.  Abdomen is soft. Bowel sounds are positive. No hepatomegaly. No abdominal masses felt. No tenderness.  2+ edema to lower extremities. Patient is alert and oriented.       Assessment:    Solid food dysphagia.   Screening colonoscopy    Plan:    EGD/ED and colonoscopy. The risks and benefits such as perforation, bleeding, and infection were reviewed with the patient and is agreeable.

## 2011-09-03 NOTE — Patient Instructions (Signed)
EGD/ED  And colonoscopy with Dr. Karilyn Cota

## 2011-09-04 MED ORDER — PEG-KCL-NACL-NASULF-NA ASC-C 100 G PO SOLR: 1.0000 | Freq: Once | ORAL | Status: DC

## 2011-09-17 ENCOUNTER — Other Ambulatory Visit (INDEPENDENT_AMBULATORY_CARE_PROVIDER_SITE_OTHER): Payer: Self-pay | Admitting: *Deleted

## 2011-09-17 DIAGNOSIS — Z1211 Encounter for screening for malignant neoplasm of colon: Secondary | ICD-10-CM

## 2011-09-25 ENCOUNTER — Ambulatory Visit (HOSPITAL_COMMUNITY): Admission: RE | Admit: 2011-09-25 | Payer: Medicare Other | Source: Ambulatory Visit | Admitting: Internal Medicine

## 2011-09-25 ENCOUNTER — Encounter (HOSPITAL_COMMUNITY): Admission: RE | Payer: Self-pay | Source: Ambulatory Visit

## 2011-09-25 SURGERY — COLONOSCOPY WITH ESOPHAGOGASTRODUODENOSCOPY (EGD)
Anesthesia: Moderate Sedation

## 2011-10-01 MED ORDER — SODIUM CHLORIDE 0.45 % IV SOLN
Freq: Once | INTRAVENOUS | Status: AC
  Administered 2011-10-02: 14:00:00 via INTRAVENOUS

## 2011-10-02 ENCOUNTER — Encounter (HOSPITAL_COMMUNITY)
Admission: RE | Disposition: A | Discharge status: Home / Self Care | Payer: Self-pay | Source: Ambulatory Visit | Attending: Internal Medicine

## 2011-10-02 ENCOUNTER — Encounter (HOSPITAL_COMMUNITY): Payer: Self-pay

## 2011-10-02 ENCOUNTER — Ambulatory Visit (HOSPITAL_COMMUNITY)
Admission: RE | Admit: 2011-10-02 | Discharge: 2011-10-02 | Disposition: A | Discharge status: Home / Self Care | Payer: Medicare Other | Source: Ambulatory Visit | Attending: Internal Medicine | Admitting: Internal Medicine

## 2011-10-02 DIAGNOSIS — Z79899 Other long term (current) drug therapy: Secondary | ICD-10-CM | POA: Insufficient documentation

## 2011-10-02 DIAGNOSIS — R1310 Dysphagia, unspecified: Secondary | ICD-10-CM | POA: Insufficient documentation

## 2011-10-02 DIAGNOSIS — Z1211 Encounter for screening for malignant neoplasm of colon: Secondary | ICD-10-CM

## 2011-10-02 DIAGNOSIS — Z01812 Encounter for preprocedural laboratory examination: Secondary | ICD-10-CM | POA: Insufficient documentation

## 2011-10-02 DIAGNOSIS — D126 Benign neoplasm of colon, unspecified: Secondary | ICD-10-CM

## 2011-10-02 DIAGNOSIS — E119 Type 2 diabetes mellitus without complications: Secondary | ICD-10-CM | POA: Insufficient documentation

## 2011-10-02 DIAGNOSIS — K208 Other esophagitis: Secondary | ICD-10-CM

## 2011-10-02 DIAGNOSIS — I10 Essential (primary) hypertension: Secondary | ICD-10-CM | POA: Insufficient documentation

## 2011-10-02 DIAGNOSIS — K222 Esophageal obstruction: Secondary | ICD-10-CM

## 2011-10-02 HISTORY — DX: Adverse effect of unspecified anesthetic, initial encounter: T41.45XA

## 2011-10-02 HISTORY — PX: BALLOON DILATION: SHX5330

## 2011-10-02 HISTORY — PX: MALONEY DILATION: SHX5535

## 2011-10-02 HISTORY — PX: SAVORY DILATION: SHX5439

## 2011-10-02 HISTORY — DX: Other complications of anesthesia, initial encounter: T88.59XA

## 2011-10-02 SURGERY — COLONOSCOPY WITH ESOPHAGOGASTRODUODENOSCOPY (EGD)
Anesthesia: Moderate Sedation

## 2011-10-02 MED ORDER — MEPERIDINE HCL 50 MG/ML IJ SOLN
INTRAMUSCULAR | Status: AC
  Filled 2011-10-02: qty 1

## 2011-10-02 MED ORDER — BUTAMBEN-TETRACAINE-BENZOCAINE 2-2-14 % EX AERO
INHALATION_SPRAY | CUTANEOUS | Status: DC | PRN
  Administered 2011-10-02: 2 via TOPICAL

## 2011-10-02 MED ORDER — MIDAZOLAM HCL 5 MG/5ML IJ SOLN
INTRAMUSCULAR | Status: DC | PRN
  Administered 2011-10-02: 3 mg via INTRAVENOUS
  Administered 2011-10-02 (×3): 2 mg via INTRAVENOUS
  Administered 2011-10-02: 3 mg via INTRAVENOUS
  Administered 2011-10-02: 1 mg via INTRAVENOUS
  Administered 2011-10-02: 2 mg via INTRAVENOUS

## 2011-10-02 MED ORDER — MEPERIDINE HCL 50 MG/ML IJ SOLN
INTRAMUSCULAR | Status: DC | PRN
Start: 2011-10-02 — End: 2011-10-02
  Administered 2011-10-02 (×3): 25 mg via INTRAVENOUS

## 2011-10-02 MED ORDER — MIDAZOLAM HCL 5 MG/5ML IJ SOLN
INTRAMUSCULAR | Status: AC
  Filled 2011-10-02: qty 10

## 2011-10-02 MED ORDER — MIDAZOLAM HCL 5 MG/5ML IJ SOLN
INTRAMUSCULAR | Status: AC
  Filled 2011-10-02: qty 5

## 2011-10-02 MED ORDER — PANTOPRAZOLE SODIUM 40 MG PO TBEC: 40.0000 mg | DELAYED_RELEASE_TABLET | Freq: Every day | ORAL | Status: DC

## 2011-10-02 MED ORDER — STERILE WATER FOR IRRIGATION IR SOLN
Status: DC | PRN
  Administered 2011-10-02: 15:00:00

## 2011-10-02 NOTE — Op Note (Addendum)
 EGD PROCEDURE REPORT  PATIENT:  [name]  MR#:  [id number] Birthdate:  [birth year], 56 y.o., female Endoscopist:  Dr. Malissa Hippo, MD Referred By:  Dr. Madelin Rear. Sherwood Gambler, MD Procedure Date: 10/02/2011  Procedure:   EGD, ED & Colonoscopy  Indications:  Patient is 56 year old Caucasian female with intermittent solid food dysphagia. She is undergoing EGD with DD followed by screening colonoscopy.            Informed Consent:  The risks, benefits, alternatives & imponderables which include, but are not limited to, bleeding, infection, perforation, drug reaction and potential missed lesion have been reviewed.  The potential for biopsy, lesion removal, esophageal dilation, etc. have also been discussed.  Questions have been answered.  All parties agreeable.  Please see history & physical in medical record for more information.  Medications:  Demerol 75 mg IV Versed 15 mg IV Cetacaine spray topically for oropharyngeal anesthesia  EGD  Description of procedure:  The endoscope was introduced through the mouth and advanced to the second portion of the duodenum without difficulty or limitations. The mucosal surfaces were surveyed very carefully during advancement of the scope and upon withdrawal.  Findings:  Esophagus:  Mucosa of the esophagus was normal. Focal edema erythema and a ring noted at the GE junction. GEJ:  36 cm Hiatus:  38 cm Stomach:  Stomach was empty and distended very well with insufflation. Folds in the proximal stomach were normal. Some additional mucosa at the body, antrum pyloric channel as well as and ulnaris fundus and cardia was normal. Duodenum:  Normal bulbar and post bulbar mucosa.  Therapeutic/Diagnostic Maneuvers Performed:  Esophagus dilated by passing [address] [place] dilator. Endoscope was passed again and drained and documented to have been disrupted.  COLONOSCOPY Description of procedure:  After a digital rectal exam was performed, that colonoscope was  advanced from the anus through the rectum and colon to the area of the cecum, ileocecal valve and appendiceal orifice. The cecum was deeply intubated. These structures were well-seen and photographed for the record. From the level of the cecum and ileocecal valve, the scope was slowly and cautiously withdrawn. The mucosal surfaces were carefully surveyed utilizing scope tip to flexion to facilitate fold flattening as needed. The scope was pulled down into the rectum where a thorough exam including retroflexion was performed.  Findings:   Prep excellent. Tortuous sigmoid colon. 12 mm sessile polyp across the ileocecal valve. Saline-assisted polypectomy performed. 2 hemoclips applied to polypectomy site to reduce risk of post-polypectomy bleed. Area behind the ileocecal valve could not be seen. Normal rectal mucosa. Thickened anoderm.  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 Small sliding hiatal hernia. 12 mm sessile polyp noted across the ileocecal valve. Saline-assisted polypectomy performed. Hemoclips applied to polypectomy site to reduce risk of post-polypectomy bleed. Area behind the ileocecal valve was not seen.  Recommendations:  Standard instructions. I will be contacting patient with results of biopsy and further recommendations. Will consider next colonoscopy under fluoroscopy with use of overtube.  , U  10/02/2011 4:06 PM  CC: Dr. Cassell Smiles., MD, MD & Dr. Bonnetta Barry ref. provider found     Addendum Polyp removed via saline-assisted snare polypectomy.

## 2011-10-02 NOTE — Discharge Instructions (Signed)
Resume usual medications and diet. No aspirin or NSAIDs for 10 days. No driving for 24 hours. Physician to contact you with results of biopsy. Please remember you cannot have MRI until you  have pased hemoclipped that were used todayColon Polyps A polyp is extra tissue that grows inside your body. Colon polyps grow in the large intestine. The large intestine, also called the colon, is part of your digestive system. It is a long, hollow tube at the end of your digestive tract where your body makes and stores stool. Most polyps are not dangerous. They are benign. This means they are not cancerous. But over time, some types of polyps can turn into cancer. Polyps that are smaller than a pea are usually not harmful. But larger polyps could someday become or may already be cancerous. To be safe, doctors remove all polyps and test them.  WHO GETS POLYPS? Anyone can get polyps, but certain people are more likely than others. You may have a greater chance of getting polyps if:  You are over 50.   You have had polyps before.   Someone in your family has had polyps.   Someone in your family has had cancer of the large intestine.   Find out if someone in your family has had polyps. You may also be more likely to get polyps if you:   Eat a lot of fatty foods.   Smoke.   Drink alcohol.   Do not exercise.   Eat too much.  SYMPTOMS  Most small polyps do not cause symptoms. People often do not know they have one until their caregiver finds it during a regular checkup or while testing them for something else. Some people do have symptoms like these:  Bleeding from the anus. You might notice blood on your underwear or on toilet paper after you have had a bowel movement.   Constipation or diarrhea that lasts more than a week.   Blood in the stool. Blood can make stool look black or it can show up as red streaks in the stool.  If you have any of these symptoms, see your caregiver. HOW DOES THE  DOCTOR TEST FOR POLYPS? The doctor can use four tests to check for polyps:  Digital rectal exam. The caregiver wears gloves and checks your rectum (the last part of the large intestine) to see if it feels normal. This test would find polyps only in the rectum. Your caregiver may need to do one of the other tests listed below to find polyps higher up in the intestine.   Barium enema. The caregiver puts a liquid called barium into your rectum before taking x-rays of your large intestine. Barium makes your intestine look white in the pictures. Polyps are dark, so they are easy to see.   Sigmoidoscopy. With this test, the caregiver can see inside your large intestine. A thin flexible tube is placed into your rectum. The device is called a sigmoidoscope, which has a light and a tiny video camera in it. The caregiver uses the sigmoidoscope to look at the last third of your large intestine.   Colonoscopy. This test is like sigmoidoscopy, but the caregiver looks at all of the large intestine. It usually requires sedation. This is the most common method for finding and removing polyps.  TREATMENT   The caregiver will remove the polyp during sigmoidoscopy or colonoscopy. The polyp is then tested for cancer.   If you have had polyps, your caregiver may want you to  get tested regularly in the future.  PREVENTION  There is not one sure way to prevent polyps. You might be able to lower your risk of getting them if you:  Eat more fruits and vegetables and less fatty food.   Do not smoke.   Avoid alcohol.   Exercise every day.   Lose weight if you are overweight.   Eating more calcium and folate can also lower your risk of getting polyps. Some foods that are rich in calcium are milk, cheese, and broccoli. Some foods that are rich in folate are chickpeas, kidney beans, and spinach.   Aspirin might help prevent polyps. Studies are under way.  Document Released: 02/27/2004 Document Revised: 05/22/2011  Document Reviewed: 08/04/2007 [facility] Patient Information 2012 [place], [place].Hiatal Hernia A hiatal hernia occurs when a part of the stomach slides above the diaphragm. The diaphragm is the thin muscle separating the belly (abdomen) from the chest. A hiatal hernia can be something you are born with or develop over time. Hiatal hernias may allow stomach acid to flow back into your esophagus, the tube which carries food from your mouth to your stomach. If this acid causes problems it is called GERD (gastro-esophageal reflux disease).  SYMPTOMS  Common symptoms of GERD are heartburn (burning in your chest). This is worse when lying down or bending over. It may also cause belching and indigestion. Some of the things which make GERD worse are:  Increased weight pushes on stomach making acid rise more easily.   Smoking markedly increases acid production.   Alcohol decreases lower esophageal sphincter pressure (valve between stomach and esophagus), allowing acid from stomach into esophagus.   Late evening meals and going to bed with a full stomach increases pressure.   Anything that causes an increase in acid production.   Lower esophageal sphincter incompetence.  DIAGNOSIS  Hiatal hernia is often diagnosed with x-rays of your stomach and small bowel. This is called an UGI (upper gastrointestinal x-ray). Sometimes a gastroscopic procedure is done. This is a procedure where your caregiver uses a flexible instrument to look into the stomach and small bowel. HOME CARE INSTRUCTIONS   Try to achieve and maintain an ideal body weight.   Avoid drinking alcoholic beverages.   Stop smoking.   Put the head of your bed on 4 to 6 inch blocks. This will keep your head and esophagus higher than your stomach. If you cannot use blocks, sleep with several pillows under your head and shoulders.   Over-the-counter medications will decrease acid production. Your caregiver can also prescribe medications for  this. Take as directed.   1/2 to 1 teaspoon of an antacid taken every hour while awake, with meals and at bedtime, will neutralize acid.   Do not take aspirin, ibuprofen (Advil or Motrin), or other nonsteroidal anti-inflammatory drugs.   Do not wear tight clothing around your chest or stomach.   Eat smaller meals and eat more frequently. This keeps your stomach from getting too full. Eat slowly.   Do not lie down for 2 or 3 hours after eating. Do not eat or drink anything 1 to 2 hours before going to bed.   Avoid caffeine beverages (colas, coffee, cocoa, tea), fatty foods, citrus fruits and all other foods and drinks that contain acid and that seem to increase the problems.   Avoid bending over, especially after eating. Also avoid straining during bowel movements or when urinating or lifting things. Anything that increases the pressure in your belly increases the  amount of acid that may be pushed up into your esophagus.  SEEK IMMEDIATE MEDICAL CARE IF:  There is change in location (pain in arms, neck, jaw, teeth or back) of your pain, or the pain is getting worse.   You also experience nausea, vomiting, sweating (diaphoresis), or shortness of breath.   You develop continual vomiting, vomit blood or coffee ground material, have bright red blood in your stools, or have black tarry stools.  Some of these symptoms could signal other problems such as heart disease. MAKE SURE YOU:   Understand these instructions.   Monitor your condition.   Contact your caregiver if you are not doing well or are getting worse.  Document Released: 08/23/2003 Document Revised: 05/22/2011 Document Reviewed: 06/02/2005 [employer] Patient Information 2012 [employer], LLCGastroesophageal Reflux Disease, Adult Gastroesophageal reflux disease (GERD) happens when acid from your stomach flows up into the esophagus. When acid comes in contact with the esophagus, the acid causes soreness (inflammation) in the  esophagus. Over time, GERD may create small holes (ulcers) in the lining of the esophagus. CAUSES   Increased body weight. This puts pressure on the stomach, making acid rise from the stomach into the esophagus.   Smoking. This increases acid production in the stomach.   Drinking alcohol. This causes decreased pressure in the lower esophageal sphincter (valve or ring of muscle between the esophagus and stomach), allowing acid from the stomach into the esophagus.   Late evening meals and a full stomach. This increases pressure and acid production in the stomach.   A malformed lower esophageal sphincter.  Sometimes, no cause is found. SYMPTOMS   Burning pain in the lower part of the mid-chest behind the breastbone and in the mid-stomach area. This may occur twice a week or more often.   Trouble swallowing.   Sore throat.   Dry cough.   Asthma-like symptoms including chest tightness, shortness of breath, or wheezing.  DIAGNOSIS  Your caregiver may be able to diagnose GERD based on your symptoms. In some cases, X-rays and other tests may be done to check for complications or to check the condition of your stomach and esophagus. TREATMENT  Your caregiver may recommend over-the-counter or prescription medicines to help decrease acid production. Ask your caregiver before starting or adding any new medicines.  HOME CARE INSTRUCTIONS   Change the factors that you can control. Ask your caregiver for guidance concerning weight loss, quitting smoking, and alcohol consumption.   Avoid foods and drinks that make your symptoms worse, such as:   Caffeine or alcoholic drinks.   Chocolate.   Peppermint or mint flavorings.   Garlic and onions.   Spicy foods.   Citrus fruits, such as oranges, lemons, or limes.   Tomato-based foods such as sauce, chili, salsa, and pizza.   Fried and fatty foods.   Avoid lying down for the 3 hours prior to your bedtime or prior to taking a nap.   Eat  small, frequent meals instead of large meals.   Wear loose-fitting clothing. Do not wear anything tight around your waist that causes pressure on your stomach.   Raise the head of your bed 6 to 8 inches with wood blocks to help you sleep. Extra pillows will not help.   Only take over-the-counter or prescription medicines for pain, discomfort, or fever as directed by your caregiver.   Do not take aspirin, ibuprofen, or other nonsteroidal anti-inflammatory drugs (NSAIDs).  SEEK IMMEDIATE MEDICAL CARE IF:   You have pain in your  arms, neck, jaw, teeth, or back.   Your pain increases or changes in intensity or duration.   You develop nausea, vomiting, or sweating (diaphoresis).   You develop shortness of breath, or you faint.   Your vomit is green, yellow, black, or looks like coffee grounds or blood.   Your stool is red, bloody, or black.  These symptoms could be signs of other problems, such as heart disease, gastric bleeding, or esophageal bleeding. MAKE SURE YOU:   Understand these instructions.   Will watch your condition.   Will get help right away if you are not doing well or get worse.  Document Released: 03/12/2005 Document Revised: 05/22/2011 Document Reviewed: 12/20/2010 [facility] Patient Information 2012 [place], [place].Marland Kitchen

## 2011-10-02 NOTE — H&P (Signed)
[name] is an 56 y.o. female.   Chief Complaint: Patient is here for EGD, ED and colonoscopy. HPI: Patient is 56 year old Caucasian female who is experiencing intermittent solid food dysphagia for the last few months. She had a barium study which did not show significant abnormality the barium pill did get held up at the distal esophagus. She has difficulty primarily with solids and complaints of midsternal area as a bolus obstruction. She has occasional heartburn controlled with TUMS. She denies abdominal pain change in bowel habits rectal bleeding. History is negative for colorectal carcinoma.  Past Medical History  Diagnosis Date  . Diabetes mellitus   . Hypertension   . Complication of anesthesia     pt states she is hard to wake up after anesthetic    Past Surgical History  Procedure Date  . Back surgery 4 yrs ago     History reviewed. No pertinent family history. Social History:  reports that she has never smoked. She has never used smokeless tobacco. She reports that she does not drink alcohol or use illicit drugs.  Allergies:  Allergies  Allergen Reactions  . Augmentin   . Codeine     REACTION: itch  . Sulfonamide Derivatives     REACTION: burning sensation    Medications Prior to Admission  Medication Dose Route Frequency Provider Last Rate Last Dose  . 0.45 % sodium chloride infusion   Intravenous Once Malissa Hippo, MD 20 mL/hr at 10/02/11 1338    . meperidine (DEMEROL) 50 MG/ML injection           . midazolam (VERSED) 5 MG/5ML injection            Medications Prior to Admission  Medication Sig Dispense Refill  . ALPRAZolam (XANAX) 1 MG tablet Take 1 mg by mouth at bedtime as needed.        Marland Kitchen aspirin 81 MG tablet Take 81 mg by mouth daily.        Marland Kitchen docusate sodium (COLACE) 100 MG capsule Take 100 mg by mouth 2 (two) times daily.        Marland Kitchen estradiol (ESTRACE) 0.5 MG tablet Take 0.5 mg by mouth daily.        Marland Kitchen lidocaine (LIDODERM) 5 % Place 1 patch onto  the skin every 12 (twelve) hours. Remove & Discard patch within 12 hours or as directed by MD       . losartan (COZAAR) 50 MG tablet Take 50 mg by mouth daily.       Marland Kitchen morphine (MS CONTIN) 15 MG 12 hr tablet Take 15 mg by mouth 3 (three) times daily as needed.        Marland Kitchen oxycodone (ROXICODONE) 30 MG immediate release tablet Take 30 mg by mouth every 6 (six) hours as needed.       . peg 3350 powder (MOVIPREP) 100 G SOLR Take 1 kit (100 g total) by mouth once.  1 kit  0  . pioglitazone-metformin (ACTOPLUS MET) 15-500 MG per tablet Take 1 tablet by mouth daily.       . pregabalin (LYRICA) 75 MG capsule Take 75 mg by mouth 3 (three) times daily.        . promethazine (PHENERGAN) 25 MG tablet Take 25 mg by mouth every 6 (six) hours as needed.        Marland Kitchen atenolol (TENORMIN) 25 MG tablet Take 25 mg by mouth daily.       . furosemide (LASIX) 20 MG tablet Take  20 mg by mouth 2 (two) times daily.       Marland Kitchen glipiZIDE (GLUCOTROL XL) 10 MG 24 hr tablet Take 10 mg by mouth daily.          No results found for this or any previous visit (from the past 48 hour(s)). No results found.  Review of Systems  Constitutional: Negative for weight loss.  Gastrointestinal: Negative for abdominal pain, diarrhea, constipation, blood in stool and melena.    Blood pressure 126/55, pulse 77, temperature 97.6 F (36.4 C), temperature source Oral, resp. rate 13, height 5' 3.5" (1.613 m), weight 250 lb (113.399 kg), SpO2 99.00%. Physical Exam  Constitutional: She appears well-developed and well-nourished.  HENT:  Mouth/Throat: Oropharynx is clear and moist.  Eyes: Conjunctivae are normal. No scleral icterus.  Neck: No thyromegaly present.  Cardiovascular: Normal rate, regular rhythm and normal heart sounds.   No murmur heard. Respiratory: Effort normal.  GI: Soft. She exhibits no mass. There is no tenderness.  Musculoskeletal: She exhibits no edema.  Lymphadenopathy:    She has no cervical adenopathy.  Neurological: She  is alert.  Skin: Skin is warm and dry.     Assessment/Plan Solid food dysphagia. EGD with ED Average risk screening colonoscopy  Velmer Woelfel U 10/02/2011, 2:43 PM

## 2011-10-03 LAB — GLUCOSE, CAPILLARY: Glucose-Capillary: 125 mg/dL — ABNORMAL HIGH (ref 70–99)

## 2011-10-08 ENCOUNTER — Encounter (HOSPITAL_COMMUNITY): Payer: Self-pay | Admitting: Internal Medicine

## 2011-10-17 ENCOUNTER — Encounter (INDEPENDENT_AMBULATORY_CARE_PROVIDER_SITE_OTHER): Payer: Self-pay | Admitting: *Deleted

## 2011-11-02 ENCOUNTER — Encounter (HOSPITAL_COMMUNITY): Payer: Self-pay

## 2011-11-02 ENCOUNTER — Emergency Department (HOSPITAL_COMMUNITY)
Admission: EM | Admit: 2011-11-02 | Discharge: 2011-11-02 | Disposition: A | Discharge status: Home / Self Care | Payer: Medicare Other | Attending: Emergency Medicine | Admitting: Emergency Medicine

## 2011-11-02 ENCOUNTER — Emergency Department (HOSPITAL_COMMUNITY): Payer: Medicare Other

## 2011-11-02 DIAGNOSIS — S20229A Contusion of unspecified back wall of thorax, initial encounter: Secondary | ICD-10-CM | POA: Insufficient documentation

## 2011-11-02 DIAGNOSIS — W108XXA Fall (on) (from) other stairs and steps, initial encounter: Secondary | ICD-10-CM | POA: Insufficient documentation

## 2011-11-02 DIAGNOSIS — Z7982 Long term (current) use of aspirin: Secondary | ICD-10-CM | POA: Insufficient documentation

## 2011-11-02 DIAGNOSIS — Z79899 Other long term (current) drug therapy: Secondary | ICD-10-CM | POA: Insufficient documentation

## 2011-11-02 DIAGNOSIS — E119 Type 2 diabetes mellitus without complications: Secondary | ICD-10-CM | POA: Insufficient documentation

## 2011-11-02 DIAGNOSIS — I10 Essential (primary) hypertension: Secondary | ICD-10-CM | POA: Insufficient documentation

## 2011-11-02 DIAGNOSIS — M549 Dorsalgia, unspecified: Secondary | ICD-10-CM | POA: Insufficient documentation

## 2011-11-02 MED ORDER — NAPROXEN 500 MG PO TABS: 500.0000 mg | ORAL_TABLET | Freq: Two times a day (BID) | ORAL | Status: AC

## 2011-11-02 MED ORDER — OXYCODONE-ACETAMINOPHEN 5-325 MG PO TABS
2.0000 | ORAL_TABLET | Freq: Once | ORAL | Status: AC
  Administered 2011-11-02: 2 via ORAL
  Filled 2011-11-02: qty 2

## 2011-11-02 MED ORDER — KETOROLAC TROMETHAMINE 60 MG/2ML IM SOLN
60.0000 mg | Freq: Once | INTRAMUSCULAR | Status: AC
  Administered 2011-11-02: 60 mg via INTRAMUSCULAR
  Filled 2011-11-02: qty 2

## 2011-11-02 MED ORDER — METHOCARBAMOL 500 MG PO TABS: 500.0000 mg | ORAL_TABLET | Freq: Two times a day (BID) | ORAL | Status: AC | PRN

## 2011-11-02 NOTE — ED Notes (Signed)
Fell yesterday, missed a step and went down the steps on my butt per pt. Hurting in lower back and in buttocks per pt.

## 2011-11-02 NOTE — Discharge Instructions (Signed)
Your back pain should be treated with medicines such as ibuprofen or aleve and this back pain should get better over the next 2 weeks.  However if you develop severe or worsening pain, low back pain with fever, numbness, weakness or inability to walk or urinate, you should return to the ER immediately.  Please follow up with your doctor this week for a recheck if still having symptoms.  Your xrays do not show any fractures   Naprosyn twice daily for pain in addition to your home pain medicines  Robaxin as prescribed as a muscle relaxant

## 2011-11-02 NOTE — ED Provider Notes (Signed)
History     CSN: [id number]  Arrival date & time 11/02/11  4098   First MD Initiated Contact with Patient 11/02/11 [phone]      Chief Complaint  Patient presents with  . Fall  . Back Pain    (Consider location/radiation/quality/duration/timing/severity/associated sxs/prior treatment) HPI Comments: 56 year old female presents after falling down the stairs approximately 24 hours ago. She states that she missed a step and fell down approximately 4 stairs landing on her buttock and lower back on each step. Her pain has been persistent, worse with palpation, range of motion and ambulation and not associated with weakness, numbness, urinary retention or incontinence, fevers, history of cancer, history of needles or IV drug use. Her symptoms are severe at this time, she has taken pain medication at home with minimal relief.  Patient is a 56 y.o. female presenting with fall and back pain. The history is provided by the patient and the spouse.  Fall Pertinent negatives include no fever, no numbness, no nausea and no vomiting.  Back Pain  Pertinent negatives include no fever, no numbness and no weakness.    Past Medical History  Diagnosis Date  . Diabetes mellitus   . Hypertension   . Complication of anesthesia     pt states she is hard to wake up after anesthetic    Past Surgical History  Procedure Date  . Back surgery 4 yrs ago   . Balloon dilation 10/02/2011    Procedure: BALLOON DILATION;  Surgeon: Malissa Hippo, MD;  Location: [facility];  Service: Endoscopy;  Laterality: N/A;  Elease Hashimoto dilation 10/02/2011    Procedure: Elease Hashimoto DILATION;  Surgeon: Malissa Hippo, MD;  Location: [facility];  Service: Endoscopy;  Laterality: N/A;  . Savory dilation 10/02/2011    Procedure: SAVORY DILATION;  Surgeon: Malissa Hippo, MD;  Location: [facility];  Service: Endoscopy;  Laterality: N/A;    History reviewed. No pertinent family history.  History  Substance Use Topics  .  Smoking status: Never Smoker   . Smokeless tobacco: Never Used  . Alcohol Use: No    OB History    Grav Para Term Preterm Abortions TAB SAB Ect Mult Living                  Review of Systems  Constitutional: Negative for fever and chills.  HENT: Negative for neck pain.   Cardiovascular: Negative for leg swelling.  Gastrointestinal: Negative for nausea and vomiting.       No incontinence of bowel  Genitourinary: Negative for difficulty urinating.       No incontinence or retention  Musculoskeletal: Positive for back pain.  Skin: Negative for rash.  Neurological: Negative for weakness and numbness.    Allergies  Amoxicillin-pot clavulanate; Codeine; and Sulfonamide derivatives  Home Medications   Current Outpatient Rx  Name Route Sig Dispense Refill  . ALBUTEROL SULFATE 2 MG PO TABS Oral Take 2 mg by mouth 2 (two) times daily before a meal.    . ALPRAZOLAM 1 MG PO TABS Oral Take 1 mg by mouth at bedtime as needed.      . ASPIRIN 81 MG PO TABS Oral Take 81 mg by mouth daily.      . ATENOLOL 25 MG PO TABS Oral Take 25 mg by mouth daily.     . ATENOLOL 25 MG PO TABS Oral Take 25 mg by mouth daily.    Marland Kitchen CASANTHRANOL-DOCUSATE SODIUM 30-100 MG PO CAPS Oral Take  by mouth.    . DOCUSATE SODIUM 100 MG PO CAPS Oral Take 100 mg by mouth 2 (two) times daily.      Marland Kitchen ESTRADIOL 0.5 MG PO TABS Oral Take 0.5 mg by mouth daily.      . FUROSEMIDE 20 MG PO TABS Oral Take 20 mg by mouth 2 (two) times daily.     Marland Kitchen GLIPIZIDE ER 10 MG PO TB24 Oral Take 10 mg by mouth daily.      Marland Kitchen LIDOCAINE 5 % EX PTCH Transdermal Place 1 patch onto the skin every 12 (twelve) hours. Remove & Discard patch within 12 hours or as directed by MD     . Claris Gladden POTASSIUM 50 MG PO TABS Oral Take 50 mg by mouth daily.     Marland Kitchen METHOCARBAMOL 500 MG PO TABS Oral Take 1 tablet (500 mg total) by mouth 2 (two) times daily as needed. 20 tablet 0  . MORPHINE SULFATE ER 15 MG PO TB12 Oral Take 15 mg by mouth 3 (three) times daily as  needed.      Marland Kitchen NAPROXEN 500 MG PO TABS Oral Take 1 tablet (500 mg total) by mouth 2 (two) times daily with a meal. 30 tablet 0  . OXYCODONE HCL 30 MG PO TABS Oral Take 30 mg by mouth every 6 (six) hours as needed.     Marland Kitchen PANTOPRAZOLE SODIUM 40 MG PO TBEC Oral Take 1 tablet (40 mg total) by mouth daily. 30 tablet 5  . PIOGLITAZONE HCL-METFORMIN HCL 15-500 MG PO TABS Oral Take 1 tablet by mouth daily.     Marland Kitchen PIOGLITAZONE HCL-METFORMIN HCL 15-500 MG PO TABS Oral Take 1 tablet by mouth 2 (two) times daily with a meal.    . PREGABALIN 75 MG PO CAPS Oral Take 75 mg by mouth 3 (three) times daily.      Marland Kitchen PROMETHAZINE HCL 25 MG PO TABS Oral Take 25 mg by mouth every 6 (six) hours as needed.        BP 141/62  Pulse 89  Temp(Src) 98 F (36.7 C) (Oral)  Resp 18  Ht 5\' 3"  (1.6 m)  Wt 230 lb (104.327 kg)  BMI 40.74 kg/m2  SpO2 96%  Physical Exam  Constitutional: She appears well-developed and well-nourished.  HENT:  Head: Normocephalic and atraumatic.  Mouth/Throat: Oropharynx is clear and moist.  Eyes: Conjunctivae are normal. No scleral icterus.  Neck: Normal range of motion. Neck supple.  Cardiovascular: Normal rate and regular rhythm.   Pulmonary/Chest: Effort normal and breath sounds normal.  Musculoskeletal: She exhibits tenderness ( Focal tenderness to palpation over the lumbar spine, sacrum and coccyx. Paraspinal tenderness bilaterally). She exhibits no edema.  Neurological: She is alert. Coordination normal.       Patient is able to ambulate with a cane, has pain in her back with range of motion of the bilateral legs. There is normal strength and sensation in the bilateral legs  Skin: Skin is warm and dry. No rash noted.    ED Course  Procedures (including critical care time)  Labs Reviewed - No data to display Dg Lumbar Spine Complete  11/02/2011  *RADIOLOGY REPORT*  Clinical Data: Post fall, now with back pain  LUMBAR SPINE - COMPLETE 4+ VIEW  Comparison: 03/21/2009; lumbar spine  CT - 09/11/2007  Findings:  There are five non-rib bearing lumbar type vertebral bodies. Stable sequela of L2 - L5 paraspinal fusion, intervertebral disc replacement with extensive bone graft material about the operative site.  No definite evidence of hardware failure or loosening.  There is grossly unchanged mild (25%) compression deformity of the L2 vertebral body.  Grossly unchanged posterior disc osteophyte complex at L2 - L3 as demonstrated on prior lumbar spine CT.  There is unchanged mild multilevel DDD within the visualized lower thoracic and upper lumbar spine.  Moderate colonic stool burden.  Post cholecystectomy.  IMPRESSION: Extensive postsurgical changes of the lumbar spine without definite acute finding.  Original Report Authenticated By: Waynard Reeds, M.D.   Dg Sacrum/coccyx  11/02/2011  *RADIOLOGY REPORT*  Clinical Data: Post fall, now with sacral pain  SACRUM AND COCCYX - 2+ VIEW  Comparison: Lumbar spine radiographs - earlier same day  Findings:  No definite displaced fracture of the sacrum or coccyx.  Limited visualization of bilateral SI joints is normal.  Post lower lumbar paraspinal fusion, incompletely imaged.  Regional soft tissues are normal.  IMPRESSION: No definite displaced sacral or coccyx fracture.  Original Report Authenticated By: Waynard Reeds, M.D.     1. Back pain       MDM  Contusion to the lower back, no apparent spinal cord injury based on neurologic exam. Vital signs are normal, neurologic status is intact to the lower extremities, will rule out fracture with lumbar x-rays, sacral and coccyx x-rays. Pain medication including intramuscular ketorolac and oral Percocet given.   Imaging neg for frx, pain meds  Vida Roller, MD 11/02/11 [phone]

## 2011-11-17 ENCOUNTER — Other Ambulatory Visit (HOSPITAL_COMMUNITY): Payer: Self-pay | Admitting: Internal Medicine

## 2011-11-17 DIAGNOSIS — Z139 Encounter for screening, unspecified: Secondary | ICD-10-CM

## 2011-11-17 DIAGNOSIS — Z0000 Encounter for general adult medical examination without abnormal findings: Secondary | ICD-10-CM

## 2011-11-20 ENCOUNTER — Ambulatory Visit (HOSPITAL_COMMUNITY)
Admission: RE | Admit: 2011-11-20 | Discharge: 2011-11-20 | Disposition: A | Discharge status: Home / Self Care | Payer: Medicare Other | Source: Ambulatory Visit | Attending: Internal Medicine | Admitting: Internal Medicine

## 2011-11-20 DIAGNOSIS — Z139 Encounter for screening, unspecified: Secondary | ICD-10-CM

## 2011-11-20 DIAGNOSIS — Z1231 Encounter for screening mammogram for malignant neoplasm of breast: Secondary | ICD-10-CM | POA: Insufficient documentation

## 2011-11-20 DIAGNOSIS — Z0000 Encounter for general adult medical examination without abnormal findings: Secondary | ICD-10-CM

## 2011-12-16 ENCOUNTER — Other Ambulatory Visit (HOSPITAL_COMMUNITY): Payer: Self-pay | Admitting: Family Medicine

## 2011-12-16 DIAGNOSIS — M549 Dorsalgia, unspecified: Secondary | ICD-10-CM

## 2011-12-19 ENCOUNTER — Ambulatory Visit (HOSPITAL_COMMUNITY): Payer: Medicare Other

## 2011-12-22 ENCOUNTER — Inpatient Hospital Stay (HOSPITAL_COMMUNITY): Admission: RE | Admit: 2011-12-22 | Payer: Medicare Other | Source: Ambulatory Visit

## 2012-03-21 ENCOUNTER — Encounter (HOSPITAL_COMMUNITY): Payer: Self-pay

## 2012-03-21 ENCOUNTER — Emergency Department (HOSPITAL_COMMUNITY)
Admission: EM | Admit: 2012-03-21 | Discharge: 2012-03-21 | Disposition: A | Discharge status: Home / Self Care | Payer: Medicare Other | Attending: Emergency Medicine | Admitting: Emergency Medicine

## 2012-03-21 DIAGNOSIS — I10 Essential (primary) hypertension: Secondary | ICD-10-CM | POA: Insufficient documentation

## 2012-03-21 DIAGNOSIS — E119 Type 2 diabetes mellitus without complications: Secondary | ICD-10-CM | POA: Insufficient documentation

## 2012-03-21 DIAGNOSIS — Z882 Allergy status to sulfonamides status: Secondary | ICD-10-CM | POA: Insufficient documentation

## 2012-03-21 DIAGNOSIS — Z880 Allergy status to penicillin: Secondary | ICD-10-CM | POA: Insufficient documentation

## 2012-03-21 DIAGNOSIS — Z981 Arthrodesis status: Secondary | ICD-10-CM | POA: Insufficient documentation

## 2012-03-21 DIAGNOSIS — M5431 Sciatica, right side: Secondary | ICD-10-CM

## 2012-03-21 DIAGNOSIS — Z886 Allergy status to analgesic agent status: Secondary | ICD-10-CM | POA: Insufficient documentation

## 2012-03-21 DIAGNOSIS — Z79899 Other long term (current) drug therapy: Secondary | ICD-10-CM | POA: Insufficient documentation

## 2012-03-21 DIAGNOSIS — M5430 Sciatica, unspecified side: Secondary | ICD-10-CM | POA: Insufficient documentation

## 2012-03-21 MED ORDER — KETOROLAC TROMETHAMINE 60 MG/2ML IM SOLN
60.0000 mg | Freq: Once | INTRAMUSCULAR | Status: AC
  Administered 2012-03-21: 60 mg via INTRAMUSCULAR
  Filled 2012-03-21: qty 2

## 2012-03-21 MED ORDER — DEXAMETHASONE SODIUM PHOSPHATE 10 MG/ML IJ SOLN
10.0000 mg | Freq: Once | INTRAMUSCULAR | Status: AC
  Administered 2012-03-21: 10 mg via INTRAMUSCULAR
  Filled 2012-03-21: qty 1

## 2012-03-21 MED ORDER — ONDANSETRON 4 MG PO TBDP
4.0000 mg | ORAL_TABLET | Freq: Once | ORAL | Status: AC
  Administered 2012-03-21: 4 mg via ORAL
  Filled 2012-03-21: qty 1

## 2012-03-21 MED ORDER — PREDNISONE 20 MG PO TABS: 40.0000 mg | ORAL_TABLET | Freq: Every day | ORAL | Status: DC

## 2012-03-21 MED ORDER — NAPROXEN 500 MG PO TABS: 500.0000 mg | ORAL_TABLET | Freq: Two times a day (BID) | ORAL | Status: DC

## 2012-03-21 MED ORDER — HYDROMORPHONE HCL PF 2 MG/ML IJ SOLN
2.0000 mg | Freq: Once | INTRAMUSCULAR | Status: AC
  Administered 2012-03-21: 2 mg via INTRAMUSCULAR
  Filled 2012-03-21: qty 1

## 2012-03-21 NOTE — ED Notes (Signed)
Right knee is hurting and going into my right foot per pt. When I got up on Wednesday I felt a bad pain in my back and it just got worse per pt.

## 2012-03-21 NOTE — ED Notes (Signed)
MD at bedside. 

## 2012-03-21 NOTE — ED Provider Notes (Signed)
History     CSN: [id number]  Arrival date & time 03/21/12  0439   None     Chief Complaint  Patient presents with  . Back Pain  . Knee Pain    (Consider location/radiation/quality/duration/timing/severity/associated sxs/prior treatment) HPI Comments: 56 year old female who has a history of lumbar fusion performed by Dr. Venetia Maxon in the past. She states that on Wednesday she twisted while trying to get out of bed and felt acute onset of pain in her lower back and right buttock and since that time has had persistent pain radiating from her buttock down into the right knee and down into the foot. This is constant, moderate to severe, worse with ambulation and moving the right leg, not associated with swelling, deformity, shortness of breath, fevers, rashes. She denies any injury to the lower extremity.  Patient is a 56 y.o. female presenting with back pain and knee pain. The history is provided by the patient and the spouse.  Back Pain  Pertinent negatives include no fever, no numbness and no weakness.  Knee Pain    Past Medical History  Diagnosis Date  . Diabetes mellitus   . Hypertension   . Complication of anesthesia     pt states she is hard to wake up after anesthetic    Past Surgical History  Procedure Date  . Back surgery 4 yrs ago   . Balloon dilation 10/02/2011    Procedure: BALLOON DILATION;  Surgeon: Malissa Hippo, MD;  Location: [facility];  Service: Endoscopy;  Laterality: N/A;  Elease Hashimoto dilation 10/02/2011    Procedure: Elease Hashimoto DILATION;  Surgeon: Malissa Hippo, MD;  Location: [facility];  Service: Endoscopy;  Laterality: N/A;  . Savory dilation 10/02/2011    Procedure: SAVORY DILATION;  Surgeon: Malissa Hippo, MD;  Location: [facility];  Service: Endoscopy;  Laterality: N/A;    History reviewed. No pertinent family history.  History  Substance Use Topics  . Smoking status: Never Smoker   . Smokeless tobacco: Never Used  . Alcohol Use: No     OB History    Grav Para Term Preterm Abortions TAB SAB Ect Mult Living                  Review of Systems  Constitutional: Negative for fever and chills.  HENT: Negative for neck pain.   Cardiovascular: Negative for leg swelling.  Gastrointestinal: Negative for nausea and vomiting.       No incontinence of bowel  Genitourinary: Negative for difficulty urinating.       No incontinence or retention  Musculoskeletal: Positive for back pain.  Skin: Negative for rash.  Neurological: Negative for weakness and numbness.       Paresthesias to the right foot    Allergies  Amoxicillin-pot clavulanate; Codeine; and Sulfonamide derivatives  Home Medications   Current Outpatient Rx  Name Route Sig Dispense Refill  . ALBUTEROL SULFATE 2 MG PO TABS Oral Take 2 mg by mouth 2 (two) times daily before a meal.    . ALPRAZOLAM 1 MG PO TABS Oral Take 1 mg by mouth at bedtime as needed.      . ATENOLOL 25 MG PO TABS Oral Take 25 mg by mouth daily.     Marland Kitchen ESTRADIOL 0.5 MG PO TABS Oral Take 0.5 mg by mouth daily.      Marland Kitchen GLIPIZIDE ER 10 MG PO TB24 Oral Take 10 mg by mouth daily.      Marland Kitchen  LIDOCAINE 5 % EX PTCH Transdermal Place 1 patch onto the skin every 12 (twelve) hours. Remove & Discard patch within 12 hours or as directed by MD     . OXYCODONE HCL 30 MG PO TABS Oral Take 30 mg by mouth every 6 (six) hours as needed.     Marland Kitchen PANTOPRAZOLE SODIUM 40 MG PO TBEC Oral Take 1 tablet (40 mg total) by mouth daily. 30 tablet 5  . PIOGLITAZONE HCL-METFORMIN HCL 15-500 MG PO TABS Oral Take 1 tablet by mouth daily.     Marland Kitchen POTASSIUM CHLORIDE ER 10 MEQ PO TBCR Oral Take 20 mEq by mouth 2 (two) times daily.    Marland Kitchen PREGABALIN 75 MG PO CAPS Oral Take 75 mg by mouth 3 (three) times daily.      . ASPIRIN 81 MG PO TABS Oral Take 81 mg by mouth daily.      . ATENOLOL 25 MG PO TABS Oral Take 25 mg by mouth daily.    Marland Kitchen CASANTHRANOL-DOCUSATE SODIUM 30-100 MG PO CAPS Oral Take by mouth.    . DOCUSATE SODIUM 100 MG PO CAPS  Oral Take 100 mg by mouth 2 (two) times daily.      . FUROSEMIDE 20 MG PO TABS Oral Take 20 mg by mouth 2 (two) times daily.     Marland Kitchen LOSARTAN POTASSIUM 50 MG PO TABS Oral Take 50 mg by mouth daily.     . MORPHINE SULFATE ER 15 MG PO TB12 Oral Take 15 mg by mouth 3 (three) times daily as needed.      Marland Kitchen NAPROXEN 500 MG PO TABS Oral Take 1 tablet (500 mg total) by mouth 2 (two) times daily with a meal. 30 tablet 0  . NAPROXEN 500 MG PO TABS Oral Take 1 tablet (500 mg total) by mouth 2 (two) times daily with a meal. 30 tablet 0  . PIOGLITAZONE HCL-METFORMIN HCL 15-500 MG PO TABS Oral Take 1 tablet by mouth 2 (two) times daily with a meal.    . PREDNISONE 20 MG PO TABS Oral Take 2 tablets (40 mg total) by mouth daily. 10 tablet 0  . PROMETHAZINE HCL 25 MG PO TABS Oral Take 25 mg by mouth every 6 (six) hours as needed.        BP 159/96  Pulse 84  Temp 98.1 F (36.7 C) (Oral)  Resp 22  Ht 5\' 3"  (1.6 m)  Wt 220 lb (99.791 kg)  BMI 38.97 kg/m2  SpO2 98%  Physical Exam  Nursing note and vitals reviewed. Constitutional: She appears well-developed and well-nourished.       Uncomfortable appearing  HENT:  Head: Normocephalic and atraumatic.  Eyes: Conjunctivae normal are normal. No scleral icterus.  Cardiovascular: Normal rate and regular rhythm.   Pulmonary/Chest: Effort normal and breath sounds normal.  Abdominal: Soft.  Musculoskeletal: She exhibits tenderness. She exhibits no edema.       Normal range of motion of all joints except for the right lower extremity. There is decreased range of motion at the right knee and hip secondary to pain that radiates from the right buttock into the leg. There is no effusions, erythema or warmth of the right joint of the knee. Normal range of motion of the right ankle and foot, positive straight leg raise test. No tenderness to the lumbar spine, tenderness is present in the right buttock  Neurological:       Normal strength to the right lower extremity, mild  paresthesias to the right  lower extremity at the foot, normal strength of all major muscle compartments of the right lower extremity.  Skin: Skin is warm and dry. No rash noted.    ED Course  Procedures (including critical care time)  Labs Reviewed - No data to display No results found.   1. Sciatica of right side       MDM  The patient is obese but has no obvious signs of asymmetry or you've a the right lower extremity. The pain that she is experiencing which occurs in a radiating pattern from the lower back through the buttock into the right leg is consistent with a sciatica. She does have a history of lumbar surgery and thus I believe it would be beneficial for her to followup with her spinal surgeon. We'll treat with hydromorphone, Toradol, Decadronand have patient followup with Dr. And spinal surgeon   Without redness, fever, tachycardia or effusion or warmth of the knee I doubt this is related to either gout or septic arthritis and the patient is not reduce dose to either of these diseases.   Discharge Prescriptions include:  Naprosyn Crutches  Vida Roller, MD 03/21/12 [phone]

## 2012-03-30 ENCOUNTER — Telehealth (INDEPENDENT_AMBULATORY_CARE_PROVIDER_SITE_OTHER): Payer: Self-pay | Admitting: *Deleted

## 2012-03-30 NOTE — Telephone Encounter (Signed)
[facility] has requested a refill on Pantoprazole Sodium 40 mg, take one tablet by mouth every day.

## 2012-03-31 ENCOUNTER — Telehealth (INDEPENDENT_AMBULATORY_CARE_PROVIDER_SITE_OTHER): Payer: Self-pay | Admitting: *Deleted

## 2012-03-31 NOTE — Telephone Encounter (Signed)
[facility] requesting a refill on Pantoprazole Sodium 40 mg, take one tablet by mouth every day.

## 2012-04-05 ENCOUNTER — Other Ambulatory Visit (HOSPITAL_COMMUNITY): Payer: Self-pay | Admitting: Internal Medicine

## 2012-04-05 DIAGNOSIS — M549 Dorsalgia, unspecified: Secondary | ICD-10-CM

## 2012-04-07 ENCOUNTER — Ambulatory Visit (HOSPITAL_COMMUNITY)
Admission: RE | Admit: 2012-04-07 | Discharge: 2012-04-07 | Disposition: A | Discharge status: Home / Self Care | Payer: Medicare Other | Source: Ambulatory Visit | Attending: Internal Medicine | Admitting: Internal Medicine

## 2012-04-07 DIAGNOSIS — M5126 Other intervertebral disc displacement, lumbar region: Secondary | ICD-10-CM | POA: Insufficient documentation

## 2012-04-07 DIAGNOSIS — M549 Dorsalgia, unspecified: Secondary | ICD-10-CM

## 2012-04-07 DIAGNOSIS — M545 Low back pain, unspecified: Secondary | ICD-10-CM | POA: Insufficient documentation

## 2012-04-07 DIAGNOSIS — Z981 Arthrodesis status: Secondary | ICD-10-CM | POA: Insufficient documentation

## 2012-04-07 DIAGNOSIS — M5137 Other intervertebral disc degeneration, lumbosacral region: Secondary | ICD-10-CM | POA: Insufficient documentation

## 2012-04-07 DIAGNOSIS — M51379 Other intervertebral disc degeneration, lumbosacral region without mention of lumbar back pain or lower extremity pain: Secondary | ICD-10-CM | POA: Insufficient documentation

## 2013-03-21 ENCOUNTER — Other Ambulatory Visit (HOSPITAL_COMMUNITY): Payer: Self-pay | Admitting: Internal Medicine

## 2013-03-21 ENCOUNTER — Ambulatory Visit (HOSPITAL_COMMUNITY)
Admission: RE | Admit: 2013-03-21 | Discharge: 2013-03-21 | Disposition: A | Discharge status: Home / Self Care | Payer: Medicare Other | Source: Ambulatory Visit | Attending: Internal Medicine | Admitting: Internal Medicine

## 2013-03-21 DIAGNOSIS — R609 Edema, unspecified: Secondary | ICD-10-CM

## 2013-07-18 ENCOUNTER — Other Ambulatory Visit (HOSPITAL_COMMUNITY): Payer: Self-pay | Admitting: Internal Medicine

## 2013-07-18 DIAGNOSIS — Z139 Encounter for screening, unspecified: Secondary | ICD-10-CM

## 2013-07-21 ENCOUNTER — Ambulatory Visit (HOSPITAL_COMMUNITY)
Admission: RE | Admit: 2013-07-21 | Discharge: 2013-07-21 | Disposition: A | Discharge status: Home / Self Care | Payer: Medicare Other | Source: Ambulatory Visit | Attending: Internal Medicine | Admitting: Internal Medicine

## 2013-07-21 DIAGNOSIS — Z139 Encounter for screening, unspecified: Secondary | ICD-10-CM

## 2013-07-21 DIAGNOSIS — Z1231 Encounter for screening mammogram for malignant neoplasm of breast: Secondary | ICD-10-CM | POA: Insufficient documentation

## 2014-03-17 ENCOUNTER — Ambulatory Visit: Payer: Medicare Other | Admitting: Nutrition

## 2014-03-24 ENCOUNTER — Encounter: Payer: Medicare Other | Attending: Internal Medicine | Admitting: Nutrition

## 2014-03-24 VITALS — Ht 64.0 in | Wt 236.0 lb

## 2014-03-24 DIAGNOSIS — E1165 Type 2 diabetes mellitus with hyperglycemia: Secondary | ICD-10-CM | POA: Diagnosis present

## 2014-03-24 DIAGNOSIS — IMO0002 Reserved for concepts with insufficient information to code with codable children: Secondary | ICD-10-CM

## 2014-03-24 DIAGNOSIS — Z713 Dietary counseling and surveillance: Secondary | ICD-10-CM | POA: Diagnosis not present

## 2014-03-24 NOTE — Patient Instructions (Signed)
Intervention:  Nutrition  Plan:  Aim for 2-3 Carb Choices per meal (30-45 grams) +/- 1 either way  Aim for 0-1 Carbs per snack if hungry  Keep Food journal Include protein in moderation with your meals and snacks Consider reading food labels for Total Carbohydrate and Fat Grams of foods Consider  increasing your activity level by 15-30 minutes daily as tolerated Check blood sugars in am and before and after dinner a few times per week. Consider taking medication  as directed by MD

## 2014-03-24 NOTE — Progress Notes (Signed)
  Medical Nutrition Therapy:  Appt start time: 6237 end time:  6283.  Assessment:  Primary concerns today:Diabets. Lives by herself. Cooks some meals at home but eats a lot out. Tests blood sugars twice - three a day. BS 126 last night and 158 mg/dl. Exercise: [facility] [employer] [facility] [employer] and walks around the block. Has lost 35 lbs the last 6 months. She notes she has a little red spot on each skin. Encouraged her to talk to PCP about possible DM ulcer. Keep an eye on it and notify MD if any changes.  Preferred Learning Style:     No preference indicated   Learning Readiness:   Not ready  Contemplating  Ready  Change in progress   MEDICATIONS: see list   DIETARY INTAKE:  24-hr recall:  B ( AM): sausage with 1/2 slice bread and 1 cup Gravy, water Diet soda  Snk ( AM):   L ( PM): roast beef sandwich with only 1/2 bun, nuts, water Snk ( PM): nuts D ( PM): Hamburger with bun, pickles/onions, water, 50/50 tea. OR 2 chicken tenders, sweet potatoe, water and 50/50 tea. Snk ( PM): 40 calorie popcycle or 8-10 pringles Beverages: water  Usual physical activity:   Estimated energy needs: 1200 calories  135 g carbohydrates 90 g protein 33 g fat  Progress Towards Goal(s):  In progress.   Nutritional Diagnosis:  NB-1.1 Food and nutrition-related knowledge deficit As related to diabetes.  As evidenced by A1C 8.2%.    Intervention:  Nutrition counseling and diabetes education  Plan:  Aim for 2-3 Carb Choices per meal (30-45 grams) +/- 1 either way  Avoid 50/50 tea. Drink water only. Aim for 0-1 Carbs per snack if hungry  Keep Food journal Include protein in moderation with your meals and snacks Consider reading food labels for Total Carbohydrate and Fat Grams of foods Consider  increasing your activity level by 15-30 minutes daily as tolerated Check blood sugars in am and before and after dinner a few times per week. Continue  taking medication as directed by MD Goal: A1C to  7% in three months. 2. Lose 1-2 lbs per week.  Teaching Method Utilized:  Visual Auditory Hands on  Handouts given during visit include:  The Plate Method  Carb Counting and Food Label handouts  Meal Plan Card  Barriers to learning/adherence to lifestyle change: none  Demonstrated degree of understanding via:  Teach Back   Monitoring/Evaluation:  Dietary intake, exercise, meal planning, SBG, and body weight 2 weeks

## 2014-04-13 ENCOUNTER — Encounter: Payer: Self-pay | Admitting: Nutrition

## 2014-04-13 ENCOUNTER — Encounter: Payer: Medicare Other | Admitting: Nutrition

## 2014-04-13 VITALS — Ht 64.0 in | Wt 230.0 lb

## 2014-04-13 DIAGNOSIS — IMO0002 Reserved for concepts with insufficient information to code with codable children: Secondary | ICD-10-CM

## 2014-04-13 DIAGNOSIS — E1165 Type 2 diabetes mellitus with hyperglycemia: Secondary | ICD-10-CM | POA: Diagnosis not present

## 2014-04-13 DIAGNOSIS — E118 Type 2 diabetes mellitus with unspecified complications: Principal | ICD-10-CM

## 2014-04-13 NOTE — Progress Notes (Signed)
  Medical Nutrition Therapy:  Appt start time: 4540 end time:  9811.  Assessment:  Primary concerns today: Follow up Diabetes.  She notes she has lost 6 lbs since last visit. She feels she has made tremendous progress with her diet and blood sugars. Taking Invokana, Metformin and Glipizide. Her feet has really reduced in swelling. Has been walking. Was sick with sinus infection these past 2 weeks.. FBS 63-142 mg/dl. Before lunch: 70-169 and before dinner: 92-154 mg/dl. Has had 2 low blood sugars of 63 and 70 mg/dl this past week.Has been only drinking water with lemon and cut out diet sodas and sweet tea.. Making good progress with better food choices. Drinking more water. Diet is still high in some sodium and higher fat foods. She doesn't cook due to not being able to stand for long periods of time. Feels much better, no more pain in big toe, less arthritis pain all over.   Preferred Learning Style:     No preference indicated   Learning Readiness:   Not ready  Contemplating  Ready  Change in progress   MEDICATIONS: see list   DIETARY INTAKE:  24-hr recall:  B ( AM): 1 eggs, 2 pieces of sausage, 1 slice toast and tomatoes, water  And 4 oz of diet soda to take meds. Snk ( AM):  none L ( PM): chicken tender, tangerine, water D ( PM): 1/2 c chili beans, 5 crackers and few chips, water  Snk ( PM):  Beverages: water  Usual physical activity: Wanting to do silver sneakers at the [facility].  Estimated energy needs: 1200 calories  135 g carbohydrates 90 g protein 33 g fat  Progress Towards Goal(s):  In progress.   Nutritional Diagnosis:  NB-1.1 Food and nutrition-related knowledge deficit As related to diabetes.  As evidenced by A1C 8.2%.    Intervention:  Nutrition counseling and diabetes education  Plan:  Aim for 2-3 Carb Choices per meal (30-45 grams) +/- 1 either way  Drink water only. Keep Food journal Include protein in moderation with your meals  Reading food labels  for Total Carbohydrate and Fat Grams of foods Consider  increasing your activity level by 15-30 minutes daily as tolerated Check blood sugars in am and before and after dinner a few times per week. Continue  taking medication as directed by MD Goal: A1C to 7% in three months. 2. Lose 1-2 lbs per week. 3. Get back to the gym three times per week. Read food labels. >>> Talk to MD about having 2 low blood sugars and may consider removing Glipizide due to hypoglycemia.  For TV Dinners Guidelines:  [employer], [employer] or [employer]  Fat: Less than 7 grams per meal             Sodium: Less than 800 mg per meal             Carbs: 45 grams per meal >Add salad and a piece of fruit to meals>>>  Teaching Method Utilized:  Visual Auditory Hands on  Handouts given during visit include:  The Plate Method  Carb Counting and Food Label handouts  Meal Plan Card  Barriers to learning/adherence to lifestyle change: none  Demonstrated degree of understanding via:  Teach Back   Monitoring/Evaluation:  Dietary intake, exercise, meal planning, SBG, and body weight 1 month.

## 2014-04-13 NOTE — Patient Instructions (Addendum)
Plan:  Aim for 2-3 Carb Choices per meal (30-45 grams) +/- 1 either way  Drink water only. Keep Food journal Include protein in moderation with your meals  Reading food labels for Total Carbohydrate and Fat Grams of foods Consider  increasing your activity level by 15-30 minutes daily as tolerated Check blood sugars in am and before and after dinner a few times per week. Continue  taking medication as directed by MD Goal: A1C to 7% in three months. 2. Lose 1-2 lbs per week. 3. Get back to the gym three times per week. Read food labels. >>> Talk to MD about having 2 low blood sugars and may consider removing Glipizide due to hypoglycemia.  For TV Dinners Guidelines:  [employer], [employer] or [employer]  Fat: Less than 7 grams per meal             Sodium: Less than 800 mg per meal             Carbs: 45 grams per meal >Add salad and a piece of fruit to meals>>>

## 2014-04-14 ENCOUNTER — Ambulatory Visit (HOSPITAL_COMMUNITY)
Admission: RE | Admit: 2014-04-14 | Discharge: 2014-04-14 | Disposition: A | Discharge status: Home / Self Care | Payer: Medicare Other | Source: Ambulatory Visit | Attending: Family Medicine | Admitting: Family Medicine

## 2014-04-14 ENCOUNTER — Other Ambulatory Visit (HOSPITAL_COMMUNITY): Payer: Self-pay | Admitting: Family Medicine

## 2014-04-14 DIAGNOSIS — R0602 Shortness of breath: Secondary | ICD-10-CM | POA: Diagnosis not present

## 2014-04-14 DIAGNOSIS — J069 Acute upper respiratory infection, unspecified: Secondary | ICD-10-CM

## 2014-04-14 DIAGNOSIS — R079 Chest pain, unspecified: Secondary | ICD-10-CM | POA: Diagnosis not present

## 2014-04-14 DIAGNOSIS — R05 Cough: Secondary | ICD-10-CM | POA: Insufficient documentation

## 2014-05-15 ENCOUNTER — Encounter: Payer: Medicare Other | Attending: Internal Medicine | Admitting: Nutrition

## 2014-05-15 VITALS — Ht 64.0 in | Wt 229.8 lb

## 2014-05-15 DIAGNOSIS — E1165 Type 2 diabetes mellitus with hyperglycemia: Secondary | ICD-10-CM

## 2014-05-15 DIAGNOSIS — Z713 Dietary counseling and surveillance: Secondary | ICD-10-CM | POA: Diagnosis not present

## 2014-05-15 DIAGNOSIS — E118 Type 2 diabetes mellitus with unspecified complications: Secondary | ICD-10-CM

## 2014-05-15 DIAGNOSIS — IMO0002 Reserved for concepts with insufficient information to code with codable children: Secondary | ICD-10-CM

## 2014-05-15 NOTE — Progress Notes (Signed)
  Medical Nutrition Therapy:  Appt start time: 3419 end time:  6222.  Assessment:  Primary concerns today: Follow up Diabetes. Feels like eating the Lean Cuisine was making her swell some.  Brought BS log with her. FBS 70-140. Post meal bs ranges 92-160's. BS are much better. Still eats out quite often, but more aware of food choices and eating less fried foods. Doesn't eat a lot of low carb vegetables.Exercise: walking some during shopping. Drinking more water now and trying to eat better balanced meals. Wants to get off Glipiizide due to having a few low blood sugars and almost nightly night sweats Needs to check with PCP regarding Glipizide. Lost 1 lb since last visit. Is more consistent intaking her fluid pills but still has periods of increased swelling.   Preferred Learning Style:     No preference indicated   Learning Readiness:   Not ready  Contemplating  Ready  Change in progress   MEDICATIONS: see list   DIETARY INTAKE:  24-hr recall:  B ( AM): 1 eggs, 2 pieces of bacon, 2 slice toast and tomatoes, water  And 4 oz milk, or water or coffee. Snk ( AM):  none L ( PM): Chick fila sandwich or salad  D ( PM):grilled chicken, baked potato and salad, water Beverages: water  Usual physical activity: Wanting to do silver sneakers at the [facility].  Estimated energy needs: 1200 calories  135 g carbohydrates 90 g protein 33 g fat  Progress Towards Goal(s):  In progress.   Nutritional Diagnosis:  NB-1.1 Food and nutrition-related knowledge deficit As related to diabetes.  As evidenced by A1C 8.2%.    Intervention:  Nutrition counseling and diabetes education  Plan:  Aim for 2-3 Carb Choices per meal (30-45 grams) +/- 1 either way  Drink water only. Keep Food journal Include protein in moderation with your meals  Reading food labels for Total Carbohydrate and Fat Grams of foods and sodium Consider  increasing your activity level by 15-30 minutes daily as tolerated Check  blood sugars in am and before and after dinner a few times per week. Continue  taking medication as directed by MD Talk to your PCP about stopping Glipiizide due to low fasting blood sugar and constant night sweats. . Goal: A1C to 7% in three months. 2. Lose 1-2 lbs per week--lose 5 lbs by next visit.Marland Kitchen 3.Read food labels. >>> Talk to MD about having 2 low blood sugars and may consider removing Glipizide due to hypoglycemia.  For TV Dinners Guidelines:  [employer], [employer] or [employer]  Fat: Less than 7 grams per meal             Sodium: Less than 800 mg per meal             Carbs: 45 grams per meal >Add salad and a piece of fruit to meals>>>  Teaching Method Utilized:  Visual Auditory Hands on  Handouts given during visit include:  The Plate Method  Carb Counting and Food Label handouts  Meal Plan Card  Barriers to learning/adherence to lifestyle change: none  Demonstrated degree of understanding via:  Teach Back   Monitoring/Evaluation:  Dietary intake, exercise, meal planning, SBG, and body weight 2 months.

## 2014-05-15 NOTE — Patient Instructions (Signed)
Aim for 2-3 Carb Choices per meal (30-45 grams) +/- 1 either way  Drink water only. Keep Food journal Include protein in moderation with your meals  Reading food labels for Total Carbohydrate and Fat Grams of foods and sodium Consider  increasing your activity level by 15-30 minutes daily as tolerated Check blood sugars in am and before and after dinner a few times per week. Continue  taking medication as directed by MD Talk to your PCP about stopping Glipiizide due to low fasting blood sugar and constant night sweats. . Goal: A1C to 7% in three months. 2. Lose 1-2 lbs per week--lose 5 lbs by next visit.Marland Kitchen 3.Read food labels. >>> Talk to MD about having 2 low blood sugars and may consider removing Glipizide due to hypoglycemia.

## 2014-05-24 ENCOUNTER — Other Ambulatory Visit (HOSPITAL_COMMUNITY): Payer: Self-pay | Admitting: Internal Medicine

## 2014-05-24 DIAGNOSIS — R52 Pain, unspecified: Secondary | ICD-10-CM

## 2014-05-24 DIAGNOSIS — S83200A Bucket-handle tear of unspecified meniscus, current injury, right knee, initial encounter: Secondary | ICD-10-CM

## 2014-05-24 DIAGNOSIS — R609 Edema, unspecified: Secondary | ICD-10-CM

## 2014-05-29 ENCOUNTER — Ambulatory Visit (HOSPITAL_COMMUNITY)
Admission: RE | Admit: 2014-05-29 | Discharge: 2014-05-29 | Disposition: A | Discharge status: Home / Self Care | Payer: Medicare Other | Source: Ambulatory Visit | Attending: Internal Medicine | Admitting: Internal Medicine

## 2014-05-29 DIAGNOSIS — M25761 Osteophyte, right knee: Secondary | ICD-10-CM | POA: Diagnosis not present

## 2014-05-29 DIAGNOSIS — M94261 Chondromalacia, right knee: Secondary | ICD-10-CM | POA: Diagnosis not present

## 2014-05-29 DIAGNOSIS — R609 Edema, unspecified: Secondary | ICD-10-CM

## 2014-05-29 DIAGNOSIS — S83200A Bucket-handle tear of unspecified meniscus, current injury, right knee, initial encounter: Secondary | ICD-10-CM

## 2014-05-29 DIAGNOSIS — X58XXXA Exposure to other specified factors, initial encounter: Secondary | ICD-10-CM | POA: Diagnosis not present

## 2014-05-29 DIAGNOSIS — M7989 Other specified soft tissue disorders: Secondary | ICD-10-CM | POA: Diagnosis present

## 2014-05-29 DIAGNOSIS — M25561 Pain in right knee: Secondary | ICD-10-CM | POA: Insufficient documentation

## 2014-05-29 DIAGNOSIS — S83191A Other subluxation of right knee, initial encounter: Secondary | ICD-10-CM | POA: Insufficient documentation

## 2014-05-29 DIAGNOSIS — M25461 Effusion, right knee: Secondary | ICD-10-CM | POA: Insufficient documentation

## 2014-05-29 DIAGNOSIS — R52 Pain, unspecified: Secondary | ICD-10-CM

## 2014-06-23 ENCOUNTER — Other Ambulatory Visit (HOSPITAL_COMMUNITY): Payer: Self-pay | Admitting: Internal Medicine

## 2014-06-23 DIAGNOSIS — Z1231 Encounter for screening mammogram for malignant neoplasm of breast: Secondary | ICD-10-CM

## 2014-06-30 DIAGNOSIS — M545 Low back pain: Secondary | ICD-10-CM | POA: Diagnosis not present

## 2014-06-30 DIAGNOSIS — Z6839 Body mass index (BMI) 39.0-39.9, adult: Secondary | ICD-10-CM | POA: Diagnosis not present

## 2014-07-18 DIAGNOSIS — Z6839 Body mass index (BMI) 39.0-39.9, adult: Secondary | ICD-10-CM | POA: Diagnosis not present

## 2014-07-18 DIAGNOSIS — G894 Chronic pain syndrome: Secondary | ICD-10-CM | POA: Diagnosis not present

## 2014-07-26 ENCOUNTER — Encounter: Payer: Medicare Other | Admitting: Nutrition

## 2014-07-28 ENCOUNTER — Ambulatory Visit (HOSPITAL_COMMUNITY)
Admission: RE | Admit: 2014-07-28 | Discharge: 2014-07-28 | Disposition: A | Discharge status: Home / Self Care | Payer: Medicare Other | Source: Ambulatory Visit | Attending: Internal Medicine | Admitting: Internal Medicine

## 2014-07-28 DIAGNOSIS — Z1231 Encounter for screening mammogram for malignant neoplasm of breast: Secondary | ICD-10-CM | POA: Insufficient documentation

## 2014-07-30 ENCOUNTER — Emergency Department (HOSPITAL_COMMUNITY)
Admission: EM | Admit: 2014-07-30 | Discharge: 2014-07-30 | Disposition: A | Discharge status: Home / Self Care | Payer: Medicare Other | Attending: Emergency Medicine | Admitting: Emergency Medicine

## 2014-07-30 ENCOUNTER — Encounter (HOSPITAL_COMMUNITY): Payer: Self-pay | Admitting: Emergency Medicine

## 2014-07-30 DIAGNOSIS — Z791 Long term (current) use of non-steroidal anti-inflammatories (NSAID): Secondary | ICD-10-CM | POA: Insufficient documentation

## 2014-07-30 DIAGNOSIS — Z7952 Long term (current) use of systemic steroids: Secondary | ICD-10-CM | POA: Insufficient documentation

## 2014-07-30 DIAGNOSIS — Z79899 Other long term (current) drug therapy: Secondary | ICD-10-CM | POA: Insufficient documentation

## 2014-07-30 DIAGNOSIS — Z880 Allergy status to penicillin: Secondary | ICD-10-CM | POA: Diagnosis not present

## 2014-07-30 DIAGNOSIS — M5440 Lumbago with sciatica, unspecified side: Secondary | ICD-10-CM | POA: Diagnosis not present

## 2014-07-30 DIAGNOSIS — R1031 Right lower quadrant pain: Secondary | ICD-10-CM | POA: Insufficient documentation

## 2014-07-30 DIAGNOSIS — M545 Low back pain: Secondary | ICD-10-CM | POA: Diagnosis not present

## 2014-07-30 DIAGNOSIS — I10 Essential (primary) hypertension: Secondary | ICD-10-CM | POA: Diagnosis not present

## 2014-07-30 DIAGNOSIS — Z7982 Long term (current) use of aspirin: Secondary | ICD-10-CM | POA: Insufficient documentation

## 2014-07-30 DIAGNOSIS — E119 Type 2 diabetes mellitus without complications: Secondary | ICD-10-CM | POA: Diagnosis not present

## 2014-07-30 LAB — URINE MICROSCOPIC-ADD ON

## 2014-07-30 LAB — URINALYSIS, ROUTINE W REFLEX MICROSCOPIC
Bilirubin Urine: NEGATIVE
Hgb urine dipstick: NEGATIVE
KETONES UR: NEGATIVE mg/dL
LEUKOCYTES UA: NEGATIVE
NITRITE: NEGATIVE
PH: 5.5 (ref 5.0–8.0)
PROTEIN: NEGATIVE mg/dL
Specific Gravity, Urine: 1.02 (ref 1.005–1.030)
Urobilinogen, UA: 0.2 mg/dL (ref 0.0–1.0)

## 2014-07-30 LAB — CBG MONITORING, ED: Glucose-Capillary: 169 mg/dL — ABNORMAL HIGH (ref 70–99)

## 2014-07-30 MED ORDER — ONDANSETRON 4 MG PO TBDP
ORAL_TABLET | ORAL | Status: AC
  Filled 2014-07-30: qty 1

## 2014-07-30 MED ORDER — HYDROMORPHONE HCL 1 MG/ML IJ SOLN
1.0000 mg | Freq: Once | INTRAMUSCULAR | Status: AC
  Administered 2014-07-30: 1 mg via INTRAMUSCULAR
  Filled 2014-07-30: qty 1

## 2014-07-30 MED ORDER — DIAZEPAM 5 MG PO TABS
5.0000 mg | ORAL_TABLET | Freq: Once | ORAL | Status: AC
  Administered 2014-07-30: 5 mg via ORAL
  Filled 2014-07-30: qty 1

## 2014-07-30 MED ORDER — KETOROLAC TROMETHAMINE 30 MG/ML IJ SOLN
30.0000 mg | Freq: Once | INTRAMUSCULAR | Status: AC
Start: 2014-07-30 — End: 2014-07-30
  Administered 2014-07-30: 30 mg via INTRAMUSCULAR
  Filled 2014-07-30: qty 1

## 2014-07-30 MED ORDER — ONDANSETRON 4 MG PO TBDP
4.0000 mg | ORAL_TABLET | Freq: Once | ORAL | Status: AC
  Administered 2014-07-30: 4 mg via ORAL

## 2014-07-30 MED ORDER — DIAZEPAM 5 MG PO TABS: 5.0000 mg | ORAL_TABLET | Freq: Two times a day (BID) | ORAL | Status: AC

## 2014-07-30 NOTE — ED Notes (Signed)
Pt requesting ice chips and nausea meds. MD notified.

## 2014-07-30 NOTE — ED Provider Notes (Signed)
CSN: [id number]     Arrival date & time 07/30/14  1340 History  This chart was scribed for Laurie Muskrat, MD by Zola Button, ED Scribe. This patient was seen in room [facility]10[facility] and the patient's care was started at 3:28 PM.      Chief Complaint  Patient presents with  . Back Pain   The history is provided by the patient. No language interpreter was used.   HPI Comments: [name] is a 59 y.o. female with a hx of DDD who presents to the Emergency Department complaining of gradual onset, severe lower back pain radiating to her RLQ that started 1 month ago but worsened today. She also reports having nausea, weakness due to the pain and she also thought she noticed a black spot in her urine. Patient describes the pain starting as a "catch." She states she was crying this morning due to the pain. She had been seen at PCP office 2 weeks ago and was given 2 shots, which provided only temporary relief. Patient has been taking oxycodone with relief, but she only takes it sparingly because she is also on methocarbamol. She denies fever, incontinence and vomiting. She also denies any falls and injuries. Patient sees Dr. Vertell Limber for her back, and she has hx of back surgeries.   Past Medical History  Diagnosis Date  . Diabetes mellitus   . Hypertension   . Complication of anesthesia     pt states she is hard to wake up after anesthetic   Past Surgical History  Procedure Laterality Date  . Back surgery 4 yrs ago    . Balloon dilation  10/02/2011    Procedure: BALLOON DILATION;  Surgeon: [name], MD;  Location: [facility];  Service: Endoscopy;  Laterality: N/A;  Venia Minks dilation  10/02/2011    Procedure: Venia Minks DILATION;  Surgeon: [name], MD;  Location: [facility];  Service: Endoscopy;  Laterality: N/A;  . Savory dilation  10/02/2011    Procedure: SAVORY DILATION;  Surgeon: [name], MD;  Location: [facility];  Service: Endoscopy;  Laterality: N/A;   History  reviewed. No pertinent family history. History  Substance Use Topics  . Smoking status: Never Smoker   . Smokeless tobacco: Never Used  . Alcohol Use: No   OB History    No data available     Review of Systems  Constitutional:       Per HPI, otherwise negative  HENT:       Per HPI, otherwise negative  Respiratory:       Per HPI, otherwise negative  Cardiovascular:       Per HPI, otherwise negative  Gastrointestinal: Negative for vomiting.  Endocrine:       Negative aside from HPI  Genitourinary:       Neg aside from HPI   Musculoskeletal: Positive for back pain.       Per HPI, otherwise negative  Skin: Negative.   Neurological: Negative for syncope.      Allergies  Amoxicillin-pot clavulanate; Codeine; and Sulfonamide derivatives  Home Medications   Prior to Admission medications   Medication Sig Start Date End Date Taking? Authorizing Provider  albuterol (PROVENTIL) 2 MG tablet Take 2 mg by mouth 2 (two) times daily before a meal.    Historical Provider, MD  ALPRAZolam Duanne Moron) 1 MG tablet Take 1 mg by mouth at bedtime as needed.      Historical Provider, MD  aspirin 81 MG  tablet Take 81 mg by mouth daily.      Historical Provider, MD  atenolol (TENORMIN) 25 MG tablet Take 25 mg by mouth daily.  12/17/10   Historical Provider, MD  atenolol (TENORMIN) 25 MG tablet Take 25 mg by mouth daily.    Historical Provider, MD  Canagliflozin (INVOKANA) 300 MG TABS Take by mouth.    Historical Provider, MD  Casanthranol-Docusate Sodium 30-100 MG CAPS Take by mouth.    Historical Provider, MD  docusate sodium (COLACE) 100 MG capsule Take 100 mg by mouth 2 (two) times daily.      Historical Provider, MD  estradiol (ESTRACE) 0.5 MG tablet Take 0.5 mg by mouth daily.      Historical Provider, MD  furosemide (LASIX) 20 MG tablet Take 20 mg by mouth 2 (two) times daily.     Historical Provider, MD  glipiZIDE (GLUCOTROL XL) 10 MG 24 hr tablet Take 10 mg by mouth daily.      Historical  Provider, MD  lidocaine (LIDODERM) 5 % Place 1 patch onto the skin every 12 (twelve) hours. Remove & Discard patch within 12 hours or as directed by MD     Historical Provider, MD  losartan (COZAAR) 50 MG tablet Take 50 mg by mouth daily.  12/31/10   Historical Provider, MD  morphine (MS CONTIN) 15 MG 12 hr tablet Take 15 mg by mouth 3 (three) times daily as needed.      Historical Provider, MD  naproxen (NAPROSYN) 500 MG tablet Take 1 tablet (500 mg total) by mouth 2 (two) times daily with a meal. 03/21/12   Johnna Acosta, MD  oxycodone (ROXICODONE) 30 MG immediate release tablet Take 30 mg by mouth every 6 (six) hours as needed.  12/17/10   Historical Provider, MD  pantoprazole (PROTONIX) 40 MG tablet Take 1 tablet (40 mg total) by mouth daily. 10/02/11 10/01/12  [name], MD  pioglitazone-metformin (ACTOPLUS MET) 15-500 MG per tablet Take 1 tablet by mouth daily.     Historical Provider, MD  pioglitazone-metformin (ACTOPLUS MET) 15-500 MG per tablet Take 1 tablet by mouth 2 (two) times daily with a meal.    Historical Provider, MD  potassium chloride (K-DUR) 10 MEQ tablet Take 20 mEq by mouth 2 (two) times daily.    Historical Provider, MD  predniSONE (DELTASONE) 20 MG tablet Take 2 tablets (40 mg total) by mouth daily. 03/21/12   Johnna Acosta, MD  pregabalin (LYRICA) 75 MG capsule Take 75 mg by mouth 3 (three) times daily.      Historical Provider, MD  promethazine (PHENERGAN) 25 MG tablet Take 25 mg by mouth every 6 (six) hours as needed.      Historical Provider, MD   BP 127/63 mmHg  Pulse 71  Temp(Src) 97.7 F (36.5 C) (Oral)  Resp 20  Ht _0  (1.651 m)  Wt 227 lb (102.967 kg)  BMI 37.77 kg/m2  SpO2 100% Physical Exam  Constitutional: She is oriented to person, place, and time. She appears well-developed and well-nourished. No distress.  HENT:  Head: Normocephalic and atraumatic.  Eyes: Conjunctivae and EOM are normal.  Cardiovascular: Normal rate, regular rhythm and normal  heart sounds.   No murmur heard. Pulmonary/Chest: Effort normal and breath sounds normal. No stridor. No respiratory distress. She has no wheezes. She has no rales.  Abdominal: She exhibits no distension. There is tenderness in the right lower quadrant.  Musculoskeletal: She exhibits no edema.  Arms: Patient flexes and extends the tip independently, has negative straight leg test bilaterally.  Neurological: She is alert and oriented to person, place, and time. No cranial nerve deficit. She exhibits normal muscle tone. Coordination normal.  Gait is antalgic, but intact, typical for the patient.  Skin: Skin is warm and dry.  Psychiatric: She has a normal mood and affect.  Nursing note and vitals reviewed.   ED Course  Procedures  DIAGNOSTIC STUDIES: Oxygen Saturation is 100% on room air, normal by my interpretation.    COORDINATION OF CARE: 3:28 PM-Discussed treatment plan which includes pain medication and UA with pt at bedside and pt agreed to plan.    Labs Review Labs Reviewed  URINALYSIS, ROUTINE W REFLEX MICROSCOPIC - Abnormal; Notable for the following:    Glucose, UA >1000 (*)    All other components within normal limits  CBG MONITORING, ED - Abnormal; Notable for the following:    Glucose-Capillary 169 (*)    All other components within normal limits  URINE MICROSCOPIC-ADD ON    6:35 PM Patient has some improvement in her pain.  She is ambulatory, walking to the hallway and back, states that she is walking a typical manner. We discussed the need for additional evaluation as an outpatient.  Patient has follow-up in 3 days with orthopedics.  MDM   I personally performed the services described in this documentation, which was scribed in my presence. The recorded information has been reviewed and is accurate.   Patient presents with ongoing, worsening pain focally in the right lower back or Patient has chronic pain improved here with analgesics, muscle relaxants.   No evidence for new neurologic dysfunction, nor vascular compromise. With her improvement, and her return to ambulatory capacity, she was discharged to follow-up with her orthopedist in 3 days for consideration of additional medication, imaging, physical therapy.   Laurie Muskrat, MD 07/30/14 [phone]

## 2014-07-30 NOTE — Discharge Instructions (Signed)
As discussed, it is very important that you keep your appointment with your orthopedist this Wednesday.  Please be sure to discuss appropriate ongoing medical management of your knee and back pain, as well as physical therapy.  Return here for concerning changes in your condition.

## 2014-07-30 NOTE — ED Notes (Addendum)
Pt reports lower back pain radiating to RLQ. Pt reports seen for same at pcp office and prescribed "prednisone and methocarbamol" with no relief.pt reports nausea. Pt reports Ive been treated for a "pinched nerve but this pain is different." pt tearful in triage.

## 2014-07-30 NOTE — ED Notes (Signed)
Pt ambulated in hallways, states gait is very close to baseline,

## 2014-08-04 DIAGNOSIS — M5416 Radiculopathy, lumbar region: Secondary | ICD-10-CM | POA: Diagnosis not present

## 2014-08-09 ENCOUNTER — Ambulatory Visit (HOSPITAL_COMMUNITY): Payer: Self-pay

## 2014-08-16 DIAGNOSIS — B372 Candidiasis of skin and nail: Secondary | ICD-10-CM | POA: Diagnosis not present

## 2014-08-16 DIAGNOSIS — Z6839 Body mass index (BMI) 39.0-39.9, adult: Secondary | ICD-10-CM | POA: Diagnosis not present

## 2014-08-16 DIAGNOSIS — G894 Chronic pain syndrome: Secondary | ICD-10-CM | POA: Diagnosis not present

## 2014-08-23 DIAGNOSIS — M5136 Other intervertebral disc degeneration, lumbar region: Secondary | ICD-10-CM | POA: Diagnosis not present

## 2014-09-04 ENCOUNTER — Ambulatory Visit: Payer: Self-pay | Admitting: Nutrition

## 2014-10-17 ENCOUNTER — Encounter (INDEPENDENT_AMBULATORY_CARE_PROVIDER_SITE_OTHER): Payer: Self-pay | Admitting: *Deleted

## 2014-10-24 ENCOUNTER — Telehealth (INDEPENDENT_AMBULATORY_CARE_PROVIDER_SITE_OTHER): Payer: Self-pay | Admitting: *Deleted

## 2014-10-24 NOTE — Telephone Encounter (Signed)
Last TCS was 09/2011, your recommendation was to repeat in 3 years, patient states you told her 8-10, please verify when patient is due

## 2014-10-24 NOTE — Telephone Encounter (Signed)
Colonoscopy should be 3 years from her last exam under fluoroscopy

## 2014-10-30 NOTE — Telephone Encounter (Signed)
I spoke to patient and gave her your recommendation for 3 years from last one -- she is adamant you told her 8-10 years and that you told her the last was normal, nothing was found. I tried to explain to her that you removed some polyps. She wants you to call her and let her know why she needs one in 3 years after you told her 8-10 yrs -- [phone]

## 2014-11-07 NOTE — Telephone Encounter (Signed)
This patient wants to speak to Dr. Laural Golden

## 2014-11-07 NOTE — Telephone Encounter (Signed)
Patient has called back today, asking talking to you about recommendation for repeat TCS in 3 years, she is still adamant she was told 8-10 years, please call @ [phone]

## 2014-11-08 NOTE — Telephone Encounter (Signed)
I called her last week but there was no answer.

## 2014-11-16 DIAGNOSIS — G894 Chronic pain syndrome: Secondary | ICD-10-CM | POA: Diagnosis not present

## 2014-11-16 DIAGNOSIS — I10 Essential (primary) hypertension: Secondary | ICD-10-CM | POA: Diagnosis not present

## 2014-11-16 DIAGNOSIS — Z6839 Body mass index (BMI) 39.0-39.9, adult: Secondary | ICD-10-CM | POA: Diagnosis not present

## 2014-11-16 DIAGNOSIS — E1129 Type 2 diabetes mellitus with other diabetic kidney complication: Secondary | ICD-10-CM | POA: Diagnosis not present

## 2014-11-16 DIAGNOSIS — Z0001 Encounter for general adult medical examination with abnormal findings: Secondary | ICD-10-CM | POA: Diagnosis not present

## 2014-11-16 DIAGNOSIS — E782 Mixed hyperlipidemia: Secondary | ICD-10-CM | POA: Diagnosis not present

## 2014-11-20 DIAGNOSIS — Z0001 Encounter for general adult medical examination with abnormal findings: Secondary | ICD-10-CM | POA: Diagnosis not present

## 2014-11-20 DIAGNOSIS — I10 Essential (primary) hypertension: Secondary | ICD-10-CM | POA: Diagnosis not present

## 2014-12-25 DIAGNOSIS — E782 Mixed hyperlipidemia: Secondary | ICD-10-CM | POA: Diagnosis not present

## 2014-12-25 DIAGNOSIS — Z6839 Body mass index (BMI) 39.0-39.9, adult: Secondary | ICD-10-CM | POA: Diagnosis not present

## 2014-12-25 DIAGNOSIS — E1129 Type 2 diabetes mellitus with other diabetic kidney complication: Secondary | ICD-10-CM | POA: Diagnosis not present

## 2014-12-27 NOTE — Telephone Encounter (Signed)
Unable to reach patient despite multiple attempts. Message left on her answering service to let us know convenient time that she could be reached.

## 2014-12-28 NOTE — Telephone Encounter (Signed)
LM - Patient returned your call and said she will be home all day. The phone number is [phone].

## 2014-12-28 NOTE — Telephone Encounter (Signed)
Patient has been scheduled an apt for 01/22/15 with Dr. Laural Golden.

## 2015-01-02 NOTE — Telephone Encounter (Signed)
She has office visit next month

## 2015-01-22 ENCOUNTER — Encounter (INDEPENDENT_AMBULATORY_CARE_PROVIDER_SITE_OTHER): Payer: Self-pay | Admitting: Internal Medicine

## 2015-01-22 ENCOUNTER — Ambulatory Visit (INDEPENDENT_AMBULATORY_CARE_PROVIDER_SITE_OTHER): Payer: Medicare Other | Admitting: Internal Medicine

## 2015-01-22 VITALS — BP 122/80 | HR 76 | Temp 97.3°F | Resp 18 | Ht 65.0 in | Wt 234.5 lb

## 2015-01-22 DIAGNOSIS — Z86010 Personal history of colonic polyps: Secondary | ICD-10-CM

## 2015-01-22 DIAGNOSIS — K219 Gastro-esophageal reflux disease without esophagitis: Secondary | ICD-10-CM

## 2015-01-22 MED ORDER — PANTOPRAZOLE SODIUM 40 MG PO TBEC: 40.0000 mg | DELAYED_RELEASE_TABLET | Freq: Every day | ORAL | Status: DC

## 2015-01-22 NOTE — Patient Instructions (Addendum)
Pantoprazole 40 mg by mouth 30 minutes before breakfast daily. Please call call office with progress report in 6-8 weeks. Colonoscopy to be performed one year from now.  FYI  12 mm polyp removed from cecum and it was tubular adenoma

## 2015-01-22 NOTE — Progress Notes (Signed)
Presenting complaint;  History of colonic adenoma. Patient complains of throat symptoms.  Database:  Patient is 59 year old Caucasian female underwent screening colonoscopy in April 2013 with removal of 12 mm broad-based polyp across some ileocecal valve. Piecemeal polypectomy was performed. Polypectomy was felt to be complete. Polyp was tubular adenoma without dysplasia. Patient was advised to undergo follow-up colonoscopy in 3 years.. Patient is under impression that she could wait 7-10 years. She is present not having any symptoms in the form of abdominal pain rectal bleeding diarrhea or constipation. She also had EGD with ED at the time of colonoscopy. She was noted have mild changes of reflux esophagitis limited to GE junction along which as keys ring and small sliding hiatal hernia. She's had a few episodes of dysphagia but she is having feeling of lump in her throat every morning. She also feels like she has fluid in her throat in the superior often. She has heartburn no more than twice a week. She denies nausea vomiting or melena. She has chronic low back pain. She tries to take pain medication sparingly. Family history is negative for CRC.   Current Medications: Outpatient Encounter Prescriptions as of 01/22/2015  Medication Sig  . albuterol (PROVENTIL HFA;VENTOLIN HFA) 108 (90 BASE) MCG/ACT inhaler Inhale 2 puffs into the lungs every 6 (six) hours as needed for wheezing or shortness of breath.  . ALPRAZolam (XANAX) 1 MG tablet 1 mg at bedtime.   Marland Kitchen atenolol (TENORMIN) 25 MG tablet Take 25 mg by mouth daily.   Marland Kitchen docusate sodium (COLACE) 100 MG capsule Take 100 mg by mouth daily.  Marland Kitchen estradiol (ESTRACE) 0.5 MG tablet Take 0.5 mg by mouth daily.    . furosemide (LASIX) 20 MG tablet Take 20 mg by mouth daily.   Marland Kitchen glipiZIDE (GLUCOTROL XL) 10 MG 24 hr tablet Take 10 mg by mouth 2 (two) times daily.   Marland Kitchen lidocaine (LIDODERM) 5 % Place 1 patch onto the skin every 12 (twelve) hours. Remove &  Discard patch within 12 hours or as directed by MD   . losartan (COZAAR) 50 MG tablet Take 50 mg by mouth daily.   . meloxicam (MOBIC) 7.5 MG tablet Take 7.5 mg by mouth as needed.   . metFORMIN (GLUCOPHAGE) 500 MG tablet Take 500 mg by mouth 2 (two) times daily with a meal.  . Oxycodone HCl 10 MG TABS Take 10 mg by mouth every 6 (six) hours.  . pravastatin (PRAVACHOL) 20 MG tablet Take 20 mg by mouth daily.  . pregabalin (LYRICA) 75 MG capsule Take 75 mg by mouth 2 (two) times daily.   . [DISCONTINUED] Canagliflozin (INVOKANA) 300 MG TABS Take 300 mg by mouth daily.   . [DISCONTINUED] pantoprazole (PROTONIX) 40 MG tablet Take 1 tablet (40 mg total) by mouth daily. (Patient not taking: Reported on 07/30/2014)   No facility-administered encounter medications on file as of 01/22/2015.     Objective: Blood pressure 122/80, pulse 76, temperature 97.3 F (36.3 C), temperature source Oral, resp. rate 18, height 5\' 5"  (1.651 m), weight 234 lb 8 oz (106.369 kg). Patient is alert and in no acute distress. Conjunctiva is pink. Sclera is nonicteric Oropharyngeal mucosa is normal. No neck masses or thyromegaly noted. Cardiac exam with regular rhythm normal S1 and S2. No murmur or gallop noted. Lungs are clear to auscultation. Abdomen is full but soft and nontender without organomegaly or masses.  No LE edema or clubbing noted.    Assessment:  #1. History of colonic adenoma.  She had 12 mm broad-based polyp removed piecemeal from cecum in April 2013. She is presently asymptomatic. It would be reasonable to wait another year for next colonoscopy. She has long redundant colon and would do better with monitored anesthesia care. #2. GERD. Throat symptoms are suggestive of gastroesophageal reflux disease. She had EGD in April 2013 revealing changes of reflux esophagitis at GE junction along with small sliding hiatal hernia.    Plan:  Anti-reflux measures. Pantoprazole 40 mg by mouth every  morning. Patient will call with progress report in 6-8 weeks. Will schedule colonoscopy with propofol in one year.

## 2015-02-09 DIAGNOSIS — I10 Essential (primary) hypertension: Secondary | ICD-10-CM | POA: Diagnosis not present

## 2015-02-09 DIAGNOSIS — E782 Mixed hyperlipidemia: Secondary | ICD-10-CM | POA: Diagnosis not present

## 2015-02-09 DIAGNOSIS — E1129 Type 2 diabetes mellitus with other diabetic kidney complication: Secondary | ICD-10-CM | POA: Diagnosis not present

## 2015-02-09 DIAGNOSIS — Z1389 Encounter for screening for other disorder: Secondary | ICD-10-CM | POA: Diagnosis not present

## 2015-02-09 DIAGNOSIS — F419 Anxiety disorder, unspecified: Secondary | ICD-10-CM | POA: Diagnosis not present

## 2015-02-09 DIAGNOSIS — G894 Chronic pain syndrome: Secondary | ICD-10-CM | POA: Diagnosis not present

## 2015-03-12 DIAGNOSIS — F1120 Opioid dependence, uncomplicated: Secondary | ICD-10-CM | POA: Diagnosis not present

## 2015-03-12 DIAGNOSIS — J019 Acute sinusitis, unspecified: Secondary | ICD-10-CM | POA: Diagnosis not present

## 2015-03-12 DIAGNOSIS — E1140 Type 2 diabetes mellitus with diabetic neuropathy, unspecified: Secondary | ICD-10-CM | POA: Diagnosis not present

## 2015-03-12 DIAGNOSIS — Z1389 Encounter for screening for other disorder: Secondary | ICD-10-CM | POA: Diagnosis not present

## 2015-03-12 DIAGNOSIS — I10 Essential (primary) hypertension: Secondary | ICD-10-CM | POA: Diagnosis not present

## 2015-03-12 DIAGNOSIS — G894 Chronic pain syndrome: Secondary | ICD-10-CM | POA: Diagnosis not present

## 2015-04-03 DIAGNOSIS — L84 Corns and callosities: Secondary | ICD-10-CM | POA: Diagnosis not present

## 2015-04-03 DIAGNOSIS — E1140 Type 2 diabetes mellitus with diabetic neuropathy, unspecified: Secondary | ICD-10-CM | POA: Diagnosis not present

## 2015-04-24 DIAGNOSIS — Z23 Encounter for immunization: Secondary | ICD-10-CM | POA: Diagnosis not present

## 2015-06-15 DIAGNOSIS — E669 Obesity, unspecified: Secondary | ICD-10-CM | POA: Diagnosis not present

## 2015-06-15 DIAGNOSIS — F1120 Opioid dependence, uncomplicated: Secondary | ICD-10-CM | POA: Diagnosis not present

## 2015-06-15 DIAGNOSIS — E1165 Type 2 diabetes mellitus with hyperglycemia: Secondary | ICD-10-CM | POA: Diagnosis not present

## 2015-06-15 DIAGNOSIS — E782 Mixed hyperlipidemia: Secondary | ICD-10-CM | POA: Diagnosis not present

## 2015-06-15 DIAGNOSIS — M722 Plantar fascial fibromatosis: Secondary | ICD-10-CM | POA: Diagnosis not present

## 2015-06-15 DIAGNOSIS — Z1389 Encounter for screening for other disorder: Secondary | ICD-10-CM | POA: Diagnosis not present

## 2015-06-15 DIAGNOSIS — Z6839 Body mass index (BMI) 39.0-39.9, adult: Secondary | ICD-10-CM | POA: Diagnosis not present

## 2015-06-15 DIAGNOSIS — I10 Essential (primary) hypertension: Secondary | ICD-10-CM | POA: Diagnosis not present

## 2015-07-05 DIAGNOSIS — M779 Enthesopathy, unspecified: Secondary | ICD-10-CM | POA: Diagnosis not present

## 2015-07-05 DIAGNOSIS — M79671 Pain in right foot: Secondary | ICD-10-CM | POA: Diagnosis not present

## 2015-07-05 DIAGNOSIS — G5761 Lesion of plantar nerve, right lower limb: Secondary | ICD-10-CM | POA: Diagnosis not present

## 2015-08-09 ENCOUNTER — Other Ambulatory Visit (HOSPITAL_COMMUNITY): Payer: Self-pay | Admitting: Internal Medicine

## 2015-08-09 DIAGNOSIS — Z1231 Encounter for screening mammogram for malignant neoplasm of breast: Secondary | ICD-10-CM

## 2015-08-15 ENCOUNTER — Ambulatory Visit (HOSPITAL_COMMUNITY)
Admission: RE | Admit: 2015-08-15 | Discharge: 2015-08-15 | Disposition: A | Discharge status: Home / Self Care | Payer: Medicare Other | Source: Ambulatory Visit | Attending: Internal Medicine | Admitting: Internal Medicine

## 2015-08-15 DIAGNOSIS — Z1231 Encounter for screening mammogram for malignant neoplasm of breast: Secondary | ICD-10-CM | POA: Diagnosis not present

## 2015-08-15 DIAGNOSIS — R928 Other abnormal and inconclusive findings on diagnostic imaging of breast: Secondary | ICD-10-CM | POA: Insufficient documentation

## 2015-08-17 ENCOUNTER — Other Ambulatory Visit: Payer: Self-pay | Admitting: Internal Medicine

## 2015-08-17 ENCOUNTER — Other Ambulatory Visit (HOSPITAL_COMMUNITY): Payer: Self-pay | Admitting: Internal Medicine

## 2015-08-17 DIAGNOSIS — H5203 Hypermetropia, bilateral: Secondary | ICD-10-CM | POA: Diagnosis not present

## 2015-08-17 DIAGNOSIS — H52223 Regular astigmatism, bilateral: Secondary | ICD-10-CM | POA: Diagnosis not present

## 2015-08-17 DIAGNOSIS — R928 Other abnormal and inconclusive findings on diagnostic imaging of breast: Secondary | ICD-10-CM

## 2015-08-17 DIAGNOSIS — E119 Type 2 diabetes mellitus without complications: Secondary | ICD-10-CM | POA: Diagnosis not present

## 2015-08-17 DIAGNOSIS — H524 Presbyopia: Secondary | ICD-10-CM | POA: Diagnosis not present

## 2015-08-21 ENCOUNTER — Ambulatory Visit (HOSPITAL_COMMUNITY)
Admission: RE | Admit: 2015-08-21 | Discharge: 2015-08-21 | Disposition: A | Discharge status: Home / Self Care | Payer: Medicare Other | Source: Ambulatory Visit | Attending: Internal Medicine | Admitting: Internal Medicine

## 2015-08-21 DIAGNOSIS — R928 Other abnormal and inconclusive findings on diagnostic imaging of breast: Secondary | ICD-10-CM | POA: Diagnosis not present

## 2015-08-23 ENCOUNTER — Other Ambulatory Visit (INDEPENDENT_AMBULATORY_CARE_PROVIDER_SITE_OTHER): Payer: Self-pay | Admitting: Internal Medicine

## 2015-09-05 DIAGNOSIS — M779 Enthesopathy, unspecified: Secondary | ICD-10-CM | POA: Diagnosis not present

## 2015-09-05 DIAGNOSIS — M79671 Pain in right foot: Secondary | ICD-10-CM | POA: Diagnosis not present

## 2015-09-07 DIAGNOSIS — G894 Chronic pain syndrome: Secondary | ICD-10-CM | POA: Diagnosis not present

## 2015-09-07 DIAGNOSIS — Z1389 Encounter for screening for other disorder: Secondary | ICD-10-CM | POA: Diagnosis not present

## 2015-09-07 DIAGNOSIS — M2550 Pain in unspecified joint: Secondary | ICD-10-CM | POA: Diagnosis not present

## 2015-09-07 DIAGNOSIS — E1140 Type 2 diabetes mellitus with diabetic neuropathy, unspecified: Secondary | ICD-10-CM | POA: Diagnosis not present

## 2015-09-07 DIAGNOSIS — I10 Essential (primary) hypertension: Secondary | ICD-10-CM | POA: Diagnosis not present

## 2015-09-26 ENCOUNTER — Encounter (INDEPENDENT_AMBULATORY_CARE_PROVIDER_SITE_OTHER): Payer: Self-pay | Admitting: *Deleted

## 2015-10-01 DIAGNOSIS — G5762 Lesion of plantar nerve, left lower limb: Secondary | ICD-10-CM | POA: Diagnosis not present

## 2015-10-01 DIAGNOSIS — M25579 Pain in unspecified ankle and joints of unspecified foot: Secondary | ICD-10-CM | POA: Diagnosis not present

## 2015-10-01 DIAGNOSIS — M79672 Pain in left foot: Secondary | ICD-10-CM | POA: Diagnosis not present

## 2015-10-22 DIAGNOSIS — M25579 Pain in unspecified ankle and joints of unspecified foot: Secondary | ICD-10-CM | POA: Diagnosis not present

## 2015-10-22 DIAGNOSIS — M79671 Pain in right foot: Secondary | ICD-10-CM | POA: Diagnosis not present

## 2015-10-24 DIAGNOSIS — H9209 Otalgia, unspecified ear: Secondary | ICD-10-CM | POA: Diagnosis not present

## 2015-10-24 DIAGNOSIS — Z1389 Encounter for screening for other disorder: Secondary | ICD-10-CM | POA: Diagnosis not present

## 2015-10-29 DIAGNOSIS — M79671 Pain in right foot: Secondary | ICD-10-CM | POA: Diagnosis not present

## 2015-10-29 DIAGNOSIS — M25579 Pain in unspecified ankle and joints of unspecified foot: Secondary | ICD-10-CM | POA: Diagnosis not present

## 2015-11-09 DIAGNOSIS — H9209 Otalgia, unspecified ear: Secondary | ICD-10-CM | POA: Diagnosis not present

## 2015-12-10 DIAGNOSIS — G894 Chronic pain syndrome: Secondary | ICD-10-CM | POA: Diagnosis not present

## 2015-12-10 DIAGNOSIS — Z1389 Encounter for screening for other disorder: Secondary | ICD-10-CM | POA: Diagnosis not present

## 2015-12-10 DIAGNOSIS — E1165 Type 2 diabetes mellitus with hyperglycemia: Secondary | ICD-10-CM | POA: Diagnosis not present

## 2015-12-10 DIAGNOSIS — R201 Hypoesthesia of skin: Secondary | ICD-10-CM | POA: Diagnosis not present

## 2015-12-10 DIAGNOSIS — I10 Essential (primary) hypertension: Secondary | ICD-10-CM | POA: Diagnosis not present

## 2016-03-11 DIAGNOSIS — E1142 Type 2 diabetes mellitus with diabetic polyneuropathy: Secondary | ICD-10-CM | POA: Diagnosis not present

## 2016-03-11 DIAGNOSIS — Z1389 Encounter for screening for other disorder: Secondary | ICD-10-CM | POA: Diagnosis not present

## 2016-03-11 DIAGNOSIS — R201 Hypoesthesia of skin: Secondary | ICD-10-CM | POA: Diagnosis not present

## 2016-03-11 DIAGNOSIS — Z23 Encounter for immunization: Secondary | ICD-10-CM | POA: Diagnosis not present

## 2016-03-11 DIAGNOSIS — I10 Essential (primary) hypertension: Secondary | ICD-10-CM | POA: Diagnosis not present

## 2016-03-27 ENCOUNTER — Telehealth (INDEPENDENT_AMBULATORY_CARE_PROVIDER_SITE_OTHER): Payer: Self-pay | Admitting: *Deleted

## 2016-03-27 ENCOUNTER — Other Ambulatory Visit (INDEPENDENT_AMBULATORY_CARE_PROVIDER_SITE_OTHER): Payer: Self-pay | Admitting: *Deleted

## 2016-03-27 ENCOUNTER — Encounter (INDEPENDENT_AMBULATORY_CARE_PROVIDER_SITE_OTHER): Payer: Self-pay | Admitting: *Deleted

## 2016-03-27 DIAGNOSIS — Z86010 Personal history of colonic polyps: Secondary | ICD-10-CM

## 2016-03-27 MED ORDER — PEG 3350-KCL-NA BICARB-NACL 420 G PO SOLR: 4000.0000 mL | Freq: Once | ORAL | 0 refills | Status: AC

## 2016-03-27 NOTE — Telephone Encounter (Signed)
Patient needs trilyte 

## 2016-03-27 NOTE — Telephone Encounter (Signed)
agree

## 2016-03-27 NOTE — Telephone Encounter (Signed)
Referring MD/PCP: fusco   Procedure: tcs w/ propofol  Reason/Indication:  Hx polyps  Has patient had this procedure before?  Yes, 2013  If so, when, by whom and where?    Is there a family history of colon cancer?  no  Who?  What age when diagnosed?    Is patient diabetic?   yes      Does patient have prosthetic heart valve or mechanical valve?  no  Do you have a pacemaker?  no  Has patient ever had endocarditis? no  Has patient had joint replacement within last 12 months?  no  Does patient tend to be constipated or take laxatives? constipation  Does patient have a history of alcohol/drug use?  no  Is patient on Coumadin, Plavix and/or Aspirin? no  Medications: see epic  Allergies: see epic  Medication Adjustment: hold DM med evening before & morning of  Procedure date & time: 04/18/16 at 955 (prep 10/27)

## 2016-03-31 DIAGNOSIS — Z23 Encounter for immunization: Secondary | ICD-10-CM | POA: Diagnosis not present

## 2016-04-10 NOTE — Patient Instructions (Signed)
Your procedure is scheduled on:04/18/2016   Report to [facility] at  8:35   AM.  Call this number if you have problems the morning of surgery: [phone]   Remember:   Do not drink or eat food:After Midnight.  :  Take these medicines the morning of surgery with A SIP OF WATER: Xanax, losartan, protonix and atenolol and use albuterol inhaler   Do not wear jewelry, make-up or nail polish.  Do not wear lotions, powders, or perfumes. You may wear deodorant.    Do not bring valuables to the hospital.  Contacts, dentures or bridgework may not be worn into surgery.  Leave suitcase in the car. After surgery it may be brought to your room.  For patients admitted to the hospital, checkout time is 11:00 AM the day of discharge.   Patients discharged the day of surgery will not be allowed to drive home.    Colonoscopy, Care After Refer to this sheet in the next few weeks. These instructions provide you with information on caring for yourself after your procedure. Your health care provider may also give you more specific instructions. Your treatment has been planned according to current medical practices, but problems sometimes occur. Call your health care provider if you have any problems or questions after your procedure. WHAT TO EXPECT AFTER THE PROCEDURE  After your procedure, it is typical to have the following:  A small amount of blood in your stool.  Moderate amounts of gas and mild abdominal cramping or bloating. HOME CARE INSTRUCTIONS  Do not drive, operate machinery, or sign important documents for 24 hours.  You may shower and resume your regular physical activities, but move at a slower pace for the first 24 hours.  Take frequent rest periods for the first 24 hours.  Walk around or put a warm pack on your abdomen to help reduce abdominal cramping and bloating.  Drink enough fluids to keep your urine clear or pale yellow.  You may resume your normal diet as instructed by  your health care provider. Avoid heavy or fried foods that are hard to digest.  Avoid drinking alcohol for 24 hours or as instructed by your health care provider.  Only take over-the-counter or prescription medicines as directed by your health care provider.  If a tissue sample (biopsy) was taken during your procedure:  Do not take aspirin or blood thinners for 7 days, or as instructed by your health care provider.  Do not drink alcohol for 7 days, or as instructed by your health care provider.  Eat soft foods for the first 24 hours. SEEK MEDICAL CARE IF: You have persistent spotting of blood in your stool 2-3 days after the procedure. SEEK IMMEDIATE MEDICAL CARE IF:  You have more than a small spotting of blood in your stool.  You pass large blood clots in your stool.  Your abdomen is swollen (distended).  You have nausea or vomiting.  You have a fever.  You have increasing abdominal pain that is not relieved with medicine.   This information is not intended to replace advice given to you by your health care provider. Make sure you discuss any questions you have with your health care provider.   Document Released: 01/15/2004 Document Revised: 03/23/2013 Document Reviewed: 02/07/2013 Elsevier Interactive Patient Education 2016 [place].  Anesthesia, Adult, Care After Refer to this sheet in the next few weeks. These instructions provide you with information on caring for yourself after your procedure. Your health care  provider may also give you more specific instructions. Your treatment has been planned according to current medical practices, but problems sometimes occur. Call your health care provider if you have any problems or questions after your procedure. WHAT TO EXPECT AFTER THE PROCEDURE After the procedure, it is typical to experience:  Sleepiness.  Nausea and vomiting. HOME CARE INSTRUCTIONS  For the first 24 hours after general anesthesia:  Have a  responsible person with you.  Do not drive a car. If you are alone, do not take public transportation.  Do not drink alcohol.  Do not take medicine that has not been prescribed by your health care provider.  Do not sign important papers or make important decisions.  You may resume a normal diet and activities as directed by your health care provider.  Change bandages (dressings) as directed.  If you have questions or problems that seem related to general anesthesia, call the hospital and ask for the anesthetist or anesthesiologist on call. SEEK MEDICAL CARE IF:  You have nausea and vomiting that continue the day after anesthesia.  You develop a rash. SEEK IMMEDIATE MEDICAL CARE IF:   You have difficulty breathing.  You have chest pain.  You have any allergic problems.   This information is not intended to replace advice given to you by your health care provider. Make sure you discuss any questions you have with your health care provider.   Document Released: 09/08/2000 Document Revised: 06/23/2014 Document Reviewed: 10/01/2011 Elsevier Interactive Patient Education [employer].

## 2016-04-11 ENCOUNTER — Other Ambulatory Visit: Payer: Self-pay

## 2016-04-11 ENCOUNTER — Encounter (HOSPITAL_COMMUNITY)
Admission: RE | Admit: 2016-04-11 | Discharge: 2016-04-11 | Disposition: A | Discharge status: Home / Self Care | Payer: Medicare Other | Source: Ambulatory Visit | Attending: Internal Medicine | Admitting: Internal Medicine

## 2016-04-11 ENCOUNTER — Encounter (HOSPITAL_COMMUNITY): Payer: Self-pay

## 2016-04-11 DIAGNOSIS — Z01812 Encounter for preprocedural laboratory examination: Secondary | ICD-10-CM | POA: Diagnosis not present

## 2016-04-11 DIAGNOSIS — Z01810 Encounter for preprocedural cardiovascular examination: Secondary | ICD-10-CM | POA: Insufficient documentation

## 2016-04-11 DIAGNOSIS — I10 Essential (primary) hypertension: Secondary | ICD-10-CM | POA: Diagnosis not present

## 2016-04-11 HISTORY — DX: Unspecified asthma, uncomplicated: J45.909

## 2016-04-11 HISTORY — DX: Cerebral infarction, unspecified: I63.9

## 2016-04-11 HISTORY — DX: Gastro-esophageal reflux disease without esophagitis: K21.9

## 2016-04-11 HISTORY — DX: Dyspnea, unspecified: R06.00

## 2016-04-11 LAB — CBC
HCT: 42.9 % (ref 36.0–46.0)
Hemoglobin: 14.5 g/dL (ref 12.0–15.0)
MCH: 31.1 pg (ref 26.0–34.0)
MCHC: 33.8 g/dL (ref 30.0–36.0)
MCV: 92.1 fL (ref 78.0–100.0)
PLATELETS: 154 10*3/uL (ref 150–400)
RBC: 4.66 MIL/uL (ref 3.87–5.11)
RDW: 12.9 % (ref 11.5–15.5)
WBC: 7.7 10*3/uL (ref 4.0–10.5)

## 2016-04-11 LAB — BASIC METABOLIC PANEL
Anion gap: 7 (ref 5–15)
BUN: 18 mg/dL (ref 6–20)
CO2: 27 mmol/L (ref 22–32)
CREATININE: 0.93 mg/dL (ref 0.44–1.00)
Calcium: 9 mg/dL (ref 8.9–10.3)
Chloride: 104 mmol/L (ref 101–111)
GFR calc Af Amer: >60 mL/min (ref >60)
GLUCOSE: 150 mg/dL — AB (ref 65–99)
POTASSIUM: 3.9 mmol/L (ref 3.5–5.1)
Sodium: 138 mmol/L (ref 135–145)

## 2016-04-18 ENCOUNTER — Encounter (HOSPITAL_COMMUNITY): Payer: Self-pay | Admitting: *Deleted

## 2016-04-18 ENCOUNTER — Ambulatory Visit (HOSPITAL_COMMUNITY): Payer: Medicare Other | Admitting: Anesthesiology

## 2016-04-18 ENCOUNTER — Ambulatory Visit (HOSPITAL_COMMUNITY)
Admission: RE | Admit: 2016-04-18 | Discharge: 2016-04-18 | Disposition: A | Discharge status: Home / Self Care | Payer: Medicare Other | Source: Ambulatory Visit | Attending: Internal Medicine | Admitting: Internal Medicine

## 2016-04-18 ENCOUNTER — Encounter (HOSPITAL_COMMUNITY)
Admission: RE | Disposition: A | Discharge status: Home / Self Care | Payer: Self-pay | Source: Ambulatory Visit | Attending: Internal Medicine

## 2016-04-18 DIAGNOSIS — D175 Benign lipomatous neoplasm of intra-abdominal organs: Secondary | ICD-10-CM | POA: Insufficient documentation

## 2016-04-18 DIAGNOSIS — K219 Gastro-esophageal reflux disease without esophagitis: Secondary | ICD-10-CM | POA: Insufficient documentation

## 2016-04-18 DIAGNOSIS — Z79890 Hormone replacement therapy (postmenopausal): Secondary | ICD-10-CM | POA: Insufficient documentation

## 2016-04-18 DIAGNOSIS — Z86010 Personal history of colonic polyps: Secondary | ICD-10-CM | POA: Diagnosis not present

## 2016-04-18 DIAGNOSIS — E119 Type 2 diabetes mellitus without complications: Secondary | ICD-10-CM | POA: Insufficient documentation

## 2016-04-18 DIAGNOSIS — K621 Rectal polyp: Secondary | ICD-10-CM | POA: Diagnosis not present

## 2016-04-18 DIAGNOSIS — Z79899 Other long term (current) drug therapy: Secondary | ICD-10-CM | POA: Insufficient documentation

## 2016-04-18 DIAGNOSIS — J45909 Unspecified asthma, uncomplicated: Secondary | ICD-10-CM | POA: Diagnosis not present

## 2016-04-18 DIAGNOSIS — Z8673 Personal history of transient ischemic attack (TIA), and cerebral infarction without residual deficits: Secondary | ICD-10-CM | POA: Insufficient documentation

## 2016-04-18 DIAGNOSIS — I10 Essential (primary) hypertension: Secondary | ICD-10-CM | POA: Diagnosis not present

## 2016-04-18 DIAGNOSIS — D121 Benign neoplasm of appendix: Secondary | ICD-10-CM | POA: Insufficient documentation

## 2016-04-18 DIAGNOSIS — D120 Benign neoplasm of cecum: Secondary | ICD-10-CM | POA: Diagnosis not present

## 2016-04-18 DIAGNOSIS — Z1211 Encounter for screening for malignant neoplasm of colon: Secondary | ICD-10-CM | POA: Diagnosis not present

## 2016-04-18 DIAGNOSIS — Z7984 Long term (current) use of oral hypoglycemic drugs: Secondary | ICD-10-CM | POA: Insufficient documentation

## 2016-04-18 DIAGNOSIS — Q439 Congenital malformation of intestine, unspecified: Secondary | ICD-10-CM | POA: Insufficient documentation

## 2016-04-18 DIAGNOSIS — Z09 Encounter for follow-up examination after completed treatment for conditions other than malignant neoplasm: Secondary | ICD-10-CM | POA: Diagnosis not present

## 2016-04-18 DIAGNOSIS — K635 Polyp of colon: Secondary | ICD-10-CM | POA: Diagnosis not present

## 2016-04-18 HISTORY — PX: COLONOSCOPY WITH PROPOFOL: SHX5780

## 2016-04-18 LAB — GLUCOSE, CAPILLARY
GLUCOSE-CAPILLARY: 125 mg/dL — AB (ref 65–99)
GLUCOSE-CAPILLARY: 98 mg/dL (ref 65–99)

## 2016-04-18 SURGERY — COLONOSCOPY WITH PROPOFOL
Anesthesia: Monitor Anesthesia Care

## 2016-04-18 MED ORDER — FENTANYL CITRATE (PF) 100 MCG/2ML IJ SOLN: 25.0000 ug | INTRAMUSCULAR | Status: DC | PRN

## 2016-04-18 MED ORDER — FENTANYL CITRATE (PF) 100 MCG/2ML IJ SOLN
INTRAMUSCULAR | Status: AC
  Filled 2016-04-18: qty 2

## 2016-04-18 MED ORDER — MIDAZOLAM HCL 5 MG/5ML IJ SOLN
INTRAMUSCULAR | Status: DC | PRN
  Administered 2016-04-18: 2 mg via INTRAVENOUS

## 2016-04-18 MED ORDER — LACTATED RINGERS IV SOLN
INTRAVENOUS | Status: DC | PRN
  Administered 2016-04-18: 09:00:00 via INTRAVENOUS

## 2016-04-18 MED ORDER — PROPOFOL 10 MG/ML IV BOLUS
INTRAVENOUS | Status: DC | PRN
Start: 2016-04-18 — End: 2016-04-18
  Administered 2016-04-18 (×3): 10 mg via INTRAVENOUS

## 2016-04-18 MED ORDER — PROPOFOL 10 MG/ML IV BOLUS
INTRAVENOUS | Status: AC
  Filled 2016-04-18: qty 40

## 2016-04-18 MED ORDER — LACTATED RINGERS IV SOLN: INTRAVENOUS | Status: DC

## 2016-04-18 MED ORDER — MIDAZOLAM HCL 2 MG/2ML IJ SOLN: 1.0000 mg | INTRAMUSCULAR | Status: DC | PRN

## 2016-04-18 MED ORDER — MIDAZOLAM HCL 2 MG/2ML IJ SOLN
INTRAMUSCULAR | Status: AC
  Filled 2016-04-18: qty 2

## 2016-04-18 MED ORDER — FENTANYL CITRATE (PF) 100 MCG/2ML IJ SOLN
INTRAMUSCULAR | Status: DC | PRN
  Administered 2016-04-18: 25 ug via INTRAVENOUS

## 2016-04-18 MED ORDER — LIDOCAINE HCL (PF) 1 % IJ SOLN
INTRAMUSCULAR | Status: AC
  Filled 2016-04-18: qty 5

## 2016-04-18 MED ORDER — CHLORHEXIDINE GLUCONATE CLOTH 2 % EX PADS
6.0000 | MEDICATED_PAD | Freq: Once | CUTANEOUS | Status: DC
Start: 2016-04-18 — End: 2016-04-18

## 2016-04-18 MED ORDER — CHLORHEXIDINE GLUCONATE CLOTH 2 % EX PADS: 6.0000 | MEDICATED_PAD | Freq: Once | CUTANEOUS | Status: DC

## 2016-04-18 MED ORDER — PROPOFOL 500 MG/50ML IV EMUL
INTRAVENOUS | Status: DC | PRN
  Administered 2016-04-18: 100 ug/kg/min via INTRAVENOUS
  Administered 2016-04-18: 125 ug/kg/min via INTRAVENOUS

## 2016-04-18 NOTE — Transfer of Care (Signed)
Immediate Anesthesia Transfer of Care Note  Patient: [name]  Procedure(s) Performed: Procedure(s) with comments: COLONOSCOPY WITH PROPOFOL (N/A) - 955  Patient Location: PACU  Anesthesia Type:MAC  Level of Consciousness: awake  Airway & Oxygen Therapy: Patient Spontanous Breathing and Patient connected to nasal cannula oxygen  Post-op Assessment: Report given to RN  Post vital signs: Reviewed and stable  Last Vitals:  Vitals:   04/18/16 0911  BP: 120/68  Pulse: 64  Resp: (!) 29  Temp: 36.6 C    Last Pain:  Vitals:   04/18/16 1022  TempSrc:   PainSc: 6       Patients Stated Pain Goal: 8 ([id number])  Complications: No apparent anesthesia complications

## 2016-04-18 NOTE — Anesthesia Preprocedure Evaluation (Addendum)
Anesthesia Evaluation  Patient identified by MRN, date of birth, ID band Patient awake    Reviewed: Allergy & Precautions, NPO status , Patient's Chart, lab work & pertinent test results, reviewed documented beta blocker date and time   History of Anesthesia Complications (+) history of anesthetic complications ("slow wake up")  Airway Mallampati: II  TM Distance: >3 FB Neck ROM: Full    Dental  (+) Teeth Intact   Pulmonary shortness of breath and with exertion, asthma ,    Pulmonary exam normal        Cardiovascular hypertension, Pt. on medications and Pt. on home beta blockers (-) angina(-) Past MI  Rhythm:Regular Rate:Normal     Neuro/Psych CVA    GI/Hepatic Neg liver ROS, GERD  Controlled and Medicated,  Endo/Other  diabetes, Well Controlled, Type 2, Oral Hypoglycemic Agents  Renal/GU negative Renal ROS     Musculoskeletal  (+) Arthritis ,   Abdominal   Peds  Hematology   Anesthesia Other Findings   Reproductive/Obstetrics                          Anesthesia Physical Anesthesia Plan  ASA: III  Anesthesia Plan: MAC   Post-op Pain Management:    Induction: Intravenous  Airway Management Planned: Simple Face Mask  Additional Equipment:   Intra-op Plan:   Post-operative Plan:   Informed Consent: I have reviewed the patients History and Physical, chart, labs and discussed the procedure including the risks, benefits and alternatives for the proposed anesthesia with the patient or authorized representative who has indicated his/her understanding and acceptance.     Plan Discussed with: Anesthesiologist  Anesthesia Plan Comments:        Anesthesia Quick Evaluation

## 2016-04-18 NOTE — Op Note (Signed)
[facility] Patient Name: [name] Procedure Date: 04/18/2016 9:33 AM MRN: [id number] Date of Birth: 05-30-1956 Attending MD: Hildred Laser , MD CSN: [id number] Age: 60 Admit Type: Outpatient Procedure:                Colonoscopy Indications:              High risk colon cancer surveillance: Personal                            history of colonic polyps Providers:                Hildred Laser, MD, Otis Peak B. Sharon Seller, RN, Randa Spike, Technician Referring MD:             Redmond School, MD Medicines:                Propofol per Anesthesia Complications:            No immediate complications. Estimated Blood Loss:     Estimated blood loss was minimal. Procedure:                Pre-Anesthesia Assessment:                           - Prior to the procedure, a History and Physical                            was performed, and patient medications and                            allergies were reviewed. The patient's tolerance of                            previous anesthesia was also reviewed. The risks                            and benefits of the procedure and the sedation                            options and risks were discussed with the patient.                            All questions were answered, and informed consent                            was obtained. Prior Anticoagulants: The patient has                            taken no previous anticoagulant or antiplatelet                            agents. ASA Grade Assessment: III - A patient with  severe systemic disease. After reviewing the risks                            and benefits, the patient was deemed in                            satisfactory condition to undergo the procedure.                           After obtaining informed consent, the colonoscope                            was passed under direct vision. Throughout the   procedure, the patient's blood pressure, pulse, and                            oxygen saturations were monitored continuously. The                            [id number]) scope was introduced through                            the and advanced to the the cecum, identified by                            appendiceal orifice and ileocecal valve. The                            colonoscopy was technically difficult and complex                            due to a tortuous colon. Successful completion of                            the procedure was aided by changing the patient's                            position and applying abdominal pressure. The                            patient tolerated the procedure well. The quality                            of the bowel preparation was good. The ileocecal                            valve, appendiceal orifice, and rectum were                            photographed. Scope In: 10:31:55 AM Scope Out: 11:04:27 AM Scope Withdrawal Time: 0 hours 13 minutes 24 seconds  Total Procedure Duration: 0 hours 32 minutes 32 seconds  Findings:      Small submucosal life, at ascending colon.      A 4 mm polyp was  found in the appendiceal orifice. The polyp was       sessile. The polyp was removed with a cold snare. Resection and       retrieval were complete. The pathology specimen was placed into Bottle       Number 1.      A 5 mm polyp was found in the rectum. The polyp was sessile. The polyp       was removed with a cold snare. Resection and retrieval were complete.       The pathology specimen was placed into Bottle Number 1.      The retroflexed view of the distal rectum and anal verge was normal and       showed no anal or rectal abnormalities. Impression:               - One 4 mm polyp at the appendiceal orifice,                            removed with a cold snare. Resected and retrieved.                           - One 5 mm polyp in the rectum,  removed with a cold                            snare. Resected and retrieved.                           - Small submucosal lipoma at ascending colon. Moderate Sedation:      Per Anesthesia Care Recommendation:           - Patient has a contact number available for                            emergencies. The signs and symptoms of potential                            delayed complications were discussed with the                            patient. Return to normal activities tomorrow.                            Written discharge instructions were provided to the                            patient.                           - Resume previous diet today.                           - Continue present medications.                           - Await pathology results.                           -  Repeat colonoscopy in 5 years for surveillance. Procedure Code(s):        --- Professional ---                           [phone], Colonoscopy, flexible; with removal of                            tumor(s), polyp(s), or other lesion(s) by snare                            technique Diagnosis Code(s):        --- Professional ---                           Z86.010, Personal history of colonic polyps                           D12.1, Benign neoplasm of appendix                           K62.1, Rectal polyp CPT copyright 2016 American Medical Association. All rights reserved. The codes documented in this report are preliminary and upon coder review may  be revised to meet current compliance requirements. Hildred Laser, MD Hildred Laser, MD 04/18/2016 11:16:31 AM This report has been signed electronically. Number of Addenda: 0

## 2016-04-18 NOTE — Discharge Instructions (Signed)
Resume usual medications and diet. °No driving for 24 hours. °Physician will call with biopsy results.Colonoscopy, Care After °Refer to this sheet in the next few weeks. These instructions provide you with information on caring for yourself after your procedure. Your health care provider may also give you more specific instructions. Your treatment has been planned according to current medical practices, but problems sometimes occur. Call your health care provider if you have any problems or questions after your procedure. °WHAT TO EXPECT AFTER THE PROCEDURE  °After your procedure, it is typical to have the following: °· A small amount of blood in your stool. °· Moderate amounts of gas and mild abdominal cramping or bloating. °HOME CARE INSTRUCTIONS °· Do not drive, operate machinery, or sign important documents for 24 hours. °· You may shower and resume your regular physical activities, but move at a slower pace for the first 24 hours. °· Take frequent rest periods for the first 24 hours. °· Walk around or put a warm pack on your abdomen to help reduce abdominal cramping and bloating. °· Drink enough fluids to keep your urine clear or pale yellow. °· You may resume your normal diet as instructed by your health care provider. Avoid heavy or fried foods that are hard to digest. °· Avoid drinking alcohol for 24 hours or as instructed by your health care provider. °· Only take over-the-counter or prescription medicines as directed by your health care provider. °· If a tissue sample (biopsy) was taken during your procedure: °¨ Do not take aspirin or blood thinners for 7 days, or as instructed by your health care provider. °¨ Do not drink alcohol for 7 days, or as instructed by your health care provider. °¨ Eat soft foods for the first 24 hours. °SEEK MEDICAL CARE IF: °You have persistent spotting of blood in your stool 2-3 days after the procedure. °SEEK IMMEDIATE MEDICAL CARE IF: °· You have more than a small spotting  of blood in your stool. °· You pass large blood clots in your stool. °· Your abdomen is swollen (distended). °· You have nausea or vomiting. °· You have a fever. °· You have increasing abdominal pain that is not relieved with medicine. °  °This information is not intended to replace advice given to you by your health care provider. Make sure you discuss any questions you have with your health care provider. °  °Document Released: 01/15/2004 Document Revised: 03/23/2013 Document Reviewed: 02/07/2013 °Elsevier Interactive Patient Education ©2016 Elsevier Inc. ° °

## 2016-04-18 NOTE — H&P (Signed)
[name] is an 60 y.o. female.   Chief Complaint: Patient is here for colonoscopy. HPI: Patient is 60 year old Caucasian female with history of colonic adenoma and is here for surveillance colonoscopy. She denies change in bowel habits abdominal pain or rectal bleeding. Last colonoscopy was in April 2013 with removal of 12 mm cecal tubular adenoma. She has redundant colon. Family history is negative for CRC.  Past Medical History:  Diagnosis Date  . Asthma   . Complication of anesthesia    pt states she is hard to wake up after anesthetic  . Diabetes mellitus   . Dyspnea   . GERD (gastroesophageal reflux disease)   . Hypertension   . Stroke [facility])    mini stroke 2008    Past Surgical History:  Procedure Laterality Date  . ABDOMINAL HYSTERECTOMY    . Back surgery 4 yrs ago  2008  . BALLOON DILATION  10/02/2011   Procedure: BALLOON DILATION;  Surgeon: [name], MD;  Location: [facility];  Service: Endoscopy;  Laterality: N/A;  . CHOLECYSTECTOMY    . MALONEY DILATION  10/02/2011   Procedure: MALONEY DILATION;  Surgeon: [name], MD;  Location: [facility];  Service: Endoscopy;  Laterality: N/A;  . SAVORY DILATION  10/02/2011   Procedure: SAVORY DILATION;  Surgeon: [name], MD;  Location: [facility];  Service: Endoscopy;  Laterality: N/A;    History reviewed. No pertinent family history. Social History:  reports that she has never smoked. She has never used smokeless tobacco. She reports that she does not drink alcohol or use drugs.  Allergies:  Allergies  Allergen Reactions  . Adhesive [Tape] Other (See Comments)    Tears skin  . Amoxicillin-Pot Clavulanate Nausea And Vomiting  . Codeine Itching  . Sulfonamide Derivatives Other (See Comments)    Burning sensation    Medications Prior to Admission  Medication Sig Dispense Refill  . albuterol (PROVENTIL HFA;VENTOLIN HFA) 108 (90 BASE) MCG/ACT inhaler Inhale 2 puffs into the lungs every 6  (six) hours as needed for wheezing or shortness of breath.    . ALPRAZolam (XANAX) 1 MG tablet Take 0.5 mg by mouth at bedtime.     Marland Kitchen atenolol (TENORMIN) 25 MG tablet Take 25 mg by mouth daily.     . bisacodyl (DULCOLAX) 5 MG EC tablet Take 10 mg by mouth daily as needed for moderate constipation.    . docusate sodium (COLACE) 100 MG capsule Take 100 mg by mouth daily.    Marland Kitchen estradiol (ESTRACE) 0.5 MG tablet Take 0.5 mg by mouth daily.      Marland Kitchen glipiZIDE (GLUCOTROL XL) 10 MG 24 hr tablet Take 10 mg by mouth 2 (two) times daily.     Marland Kitchen lidocaine (LIDODERM) 5 % Place 1 patch onto the skin daily as needed (pain). Remove & Discard patch within 12 hours or as directed by MD     . losartan (COZAAR) 25 MG tablet Take 25 mg by mouth daily.    . metFORMIN (GLUCOPHAGE) 1000 MG tablet Take 1,000 mg by mouth daily.    . Oxycodone HCl 10 MG TABS Take 10 mg by mouth every 6 (six) hours as needed (pain).     . pregabalin (LYRICA) 75 MG capsule Take 75 mg by mouth 2 (two) times daily as needed (pain).     . RaNITidine HCl (ZANTAC PO) Take 1 tablet by mouth daily as needed (heartburn).    . losartan (COZAAR) 50 MG  tablet Take 50 mg by mouth daily.     . pantoprazole (PROTONIX) 40 MG tablet TAKE ONE (1) TABLET BY MOUTH EVERY DAY (Patient not taking: Reported on 04/09/2016) 30 tablet 6    Results for orders placed or performed during the hospital encounter of 04/18/16 (from the past 48 hour(s))  Glucose, capillary     Status: Abnormal   Collection Time: 04/18/16  9:04 AM  Result Value Ref Range   Glucose-Capillary 125 (H) 65 - 99 mg/dL   No results found.  ROS  Blood pressure 120/68, pulse 64, temperature 97.9 F (36.6 C), temperature source Oral, resp. rate (!) 29, SpO2 98 %. Physical Exam  Constitutional: She appears well-developed and well-nourished.  HENT:  Mouth/Throat: Oropharynx is clear and moist.  Eyes: Conjunctivae are normal.  Neck: No thyromegaly present.  Cardiovascular: Normal rate, regular  rhythm and normal heart sounds.   No murmur heard. Respiratory: Effort normal and breath sounds normal.  GI:  Abdomen is full. There is lower midline scar. Abdomen is soft and nontender without organomegaly or masses.  Musculoskeletal: She exhibits no edema.  Lymphadenopathy:    She has no cervical adenopathy.  Neurological: She is alert.  Skin: Skin is warm and dry.     Assessment/Plan History of colonic adenoma. Surveillance colonoscopy under monitored anesthesia care.  Hildred Laser, MD 04/18/2016, 9:44 AM

## 2016-04-21 NOTE — Anesthesia Postprocedure Evaluation (Signed)
Anesthesia Post Note  Patient: [name]  Procedure(s) Performed: Procedure(s) (LRB): COLONOSCOPY WITH PROPOFOL (N/A)  Patient location during evaluation: PACU Anesthesia Type: MAC Level of consciousness: awake and alert and oriented Pain management: pain level controlled Vital Signs Assessment: post-procedure vital signs reviewed and stable Respiratory status: spontaneous breathing Cardiovascular status: blood pressure returned to baseline Postop Assessment: no signs of nausea or vomiting Anesthetic complications: no Comments: Late entry.    Last Vitals:  Vitals:   04/18/16 1150 04/18/16 1152  BP: 114/63   Pulse: (!) 58   Resp: 14   Temp:  36.4 C    Last Pain:  Vitals:   04/18/16 1152  TempSrc: Oral  PainSc: 6                  Zarina Pe

## 2016-04-23 ENCOUNTER — Encounter (HOSPITAL_COMMUNITY): Payer: Self-pay | Admitting: Internal Medicine

## 2016-05-28 DIAGNOSIS — K529 Noninfective gastroenteritis and colitis, unspecified: Secondary | ICD-10-CM | POA: Diagnosis not present

## 2016-05-28 DIAGNOSIS — R109 Unspecified abdominal pain: Secondary | ICD-10-CM | POA: Diagnosis not present

## 2016-05-28 DIAGNOSIS — Z1389 Encounter for screening for other disorder: Secondary | ICD-10-CM | POA: Diagnosis not present

## 2016-05-28 DIAGNOSIS — E860 Dehydration: Secondary | ICD-10-CM | POA: Diagnosis not present

## 2016-06-16 DIAGNOSIS — C50919 Malignant neoplasm of unspecified site of unspecified female breast: Secondary | ICD-10-CM

## 2016-06-16 HISTORY — DX: Malignant neoplasm of unspecified site of unspecified female breast: C50.919

## 2016-06-17 DIAGNOSIS — M1991 Primary osteoarthritis, unspecified site: Secondary | ICD-10-CM | POA: Diagnosis not present

## 2016-06-17 DIAGNOSIS — E119 Type 2 diabetes mellitus without complications: Secondary | ICD-10-CM | POA: Diagnosis not present

## 2016-06-17 DIAGNOSIS — G894 Chronic pain syndrome: Secondary | ICD-10-CM | POA: Diagnosis not present

## 2016-06-17 DIAGNOSIS — J329 Chronic sinusitis, unspecified: Secondary | ICD-10-CM | POA: Diagnosis not present

## 2016-07-21 ENCOUNTER — Ambulatory Visit (HOSPITAL_COMMUNITY)
Admission: RE | Admit: 2016-07-21 | Discharge: 2016-07-21 | Disposition: A | Discharge status: Home / Self Care | Payer: Medicare Other | Source: Ambulatory Visit | Attending: Family Medicine | Admitting: Family Medicine

## 2016-07-21 ENCOUNTER — Other Ambulatory Visit (HOSPITAL_COMMUNITY): Payer: Self-pay | Admitting: Family Medicine

## 2016-07-21 DIAGNOSIS — Z1389 Encounter for screening for other disorder: Secondary | ICD-10-CM | POA: Diagnosis not present

## 2016-07-21 DIAGNOSIS — R05 Cough: Secondary | ICD-10-CM | POA: Diagnosis not present

## 2016-07-21 DIAGNOSIS — J069 Acute upper respiratory infection, unspecified: Secondary | ICD-10-CM | POA: Insufficient documentation

## 2016-07-21 DIAGNOSIS — J209 Acute bronchitis, unspecified: Secondary | ICD-10-CM

## 2016-07-21 DIAGNOSIS — R918 Other nonspecific abnormal finding of lung field: Secondary | ICD-10-CM | POA: Insufficient documentation

## 2016-07-21 DIAGNOSIS — A493 Mycoplasma infection, unspecified site: Secondary | ICD-10-CM | POA: Insufficient documentation

## 2016-07-21 DIAGNOSIS — R0602 Shortness of breath: Secondary | ICD-10-CM | POA: Diagnosis not present

## 2016-08-18 ENCOUNTER — Other Ambulatory Visit (HOSPITAL_COMMUNITY): Payer: Self-pay | Admitting: Family Medicine

## 2016-08-18 ENCOUNTER — Ambulatory Visit (HOSPITAL_COMMUNITY)
Admission: RE | Admit: 2016-08-18 | Discharge: 2016-08-18 | Disposition: A | Discharge status: Home / Self Care | Payer: Medicare Other | Source: Ambulatory Visit | Attending: Family Medicine | Admitting: Family Medicine

## 2016-08-18 DIAGNOSIS — J181 Lobar pneumonia, unspecified organism: Secondary | ICD-10-CM | POA: Diagnosis not present

## 2016-08-18 DIAGNOSIS — J302 Other seasonal allergic rhinitis: Secondary | ICD-10-CM | POA: Diagnosis not present

## 2016-08-18 DIAGNOSIS — Z1231 Encounter for screening mammogram for malignant neoplasm of breast: Secondary | ICD-10-CM

## 2016-08-18 DIAGNOSIS — E1129 Type 2 diabetes mellitus with other diabetic kidney complication: Secondary | ICD-10-CM | POA: Diagnosis not present

## 2016-08-18 DIAGNOSIS — J189 Pneumonia, unspecified organism: Secondary | ICD-10-CM | POA: Diagnosis not present

## 2016-08-18 DIAGNOSIS — Z8701 Personal history of pneumonia (recurrent): Secondary | ICD-10-CM | POA: Insufficient documentation

## 2016-08-18 DIAGNOSIS — Z1389 Encounter for screening for other disorder: Secondary | ICD-10-CM | POA: Insufficient documentation

## 2016-08-28 ENCOUNTER — Other Ambulatory Visit (HOSPITAL_COMMUNITY): Payer: Self-pay | Admitting: Internal Medicine

## 2016-08-28 ENCOUNTER — Ambulatory Visit (HOSPITAL_COMMUNITY)
Admission: RE | Admit: 2016-08-28 | Discharge: 2016-08-28 | Disposition: A | Discharge status: Home / Self Care | Payer: Medicare Other | Source: Ambulatory Visit | Attending: Internal Medicine | Admitting: Internal Medicine

## 2016-08-28 DIAGNOSIS — Z1231 Encounter for screening mammogram for malignant neoplasm of breast: Secondary | ICD-10-CM | POA: Insufficient documentation

## 2016-09-03 ENCOUNTER — Other Ambulatory Visit (HOSPITAL_COMMUNITY): Payer: Self-pay | Admitting: Internal Medicine

## 2016-09-03 DIAGNOSIS — R928 Other abnormal and inconclusive findings on diagnostic imaging of breast: Secondary | ICD-10-CM

## 2016-09-09 ENCOUNTER — Other Ambulatory Visit (HOSPITAL_COMMUNITY): Payer: Self-pay | Admitting: Pulmonary Disease

## 2016-09-12 DIAGNOSIS — H5203 Hypermetropia, bilateral: Secondary | ICD-10-CM | POA: Diagnosis not present

## 2016-09-12 DIAGNOSIS — H524 Presbyopia: Secondary | ICD-10-CM | POA: Diagnosis not present

## 2016-09-12 DIAGNOSIS — E1165 Type 2 diabetes mellitus with hyperglycemia: Secondary | ICD-10-CM | POA: Diagnosis not present

## 2016-09-12 DIAGNOSIS — H52223 Regular astigmatism, bilateral: Secondary | ICD-10-CM | POA: Diagnosis not present

## 2016-09-15 DIAGNOSIS — E1165 Type 2 diabetes mellitus with hyperglycemia: Secondary | ICD-10-CM | POA: Diagnosis not present

## 2016-09-15 DIAGNOSIS — E1129 Type 2 diabetes mellitus with other diabetic kidney complication: Secondary | ICD-10-CM | POA: Diagnosis not present

## 2016-09-15 DIAGNOSIS — R201 Hypoesthesia of skin: Secondary | ICD-10-CM | POA: Diagnosis not present

## 2016-09-15 DIAGNOSIS — B353 Tinea pedis: Secondary | ICD-10-CM | POA: Diagnosis not present

## 2016-09-16 ENCOUNTER — Ambulatory Visit (HOSPITAL_COMMUNITY)
Admission: RE | Admit: 2016-09-16 | Discharge: 2016-09-16 | Disposition: A | Discharge status: Home / Self Care | Payer: Medicare Other | Source: Ambulatory Visit | Attending: Internal Medicine | Admitting: Internal Medicine

## 2016-09-16 ENCOUNTER — Other Ambulatory Visit (HOSPITAL_COMMUNITY): Payer: Self-pay | Admitting: Internal Medicine

## 2016-09-16 DIAGNOSIS — R928 Other abnormal and inconclusive findings on diagnostic imaging of breast: Secondary | ICD-10-CM | POA: Insufficient documentation

## 2016-09-16 DIAGNOSIS — N6320 Unspecified lump in the left breast, unspecified quadrant: Secondary | ICD-10-CM | POA: Diagnosis not present

## 2016-09-16 DIAGNOSIS — N6489 Other specified disorders of breast: Secondary | ICD-10-CM | POA: Diagnosis not present

## 2016-09-23 ENCOUNTER — Encounter (HOSPITAL_COMMUNITY): Payer: Self-pay

## 2016-09-23 ENCOUNTER — Ambulatory Visit (HOSPITAL_COMMUNITY)
Admission: RE | Admit: 2016-09-23 | Discharge: 2016-09-23 | Disposition: A | Discharge status: Home / Self Care | Payer: Medicare Other | Source: Ambulatory Visit | Attending: Internal Medicine | Admitting: Internal Medicine

## 2016-09-23 ENCOUNTER — Other Ambulatory Visit (HOSPITAL_COMMUNITY): Payer: Self-pay | Admitting: Internal Medicine

## 2016-09-23 DIAGNOSIS — N6320 Unspecified lump in the left breast, unspecified quadrant: Secondary | ICD-10-CM

## 2016-09-23 DIAGNOSIS — R928 Other abnormal and inconclusive findings on diagnostic imaging of breast: Secondary | ICD-10-CM

## 2016-09-23 DIAGNOSIS — C50412 Malignant neoplasm of upper-outer quadrant of left female breast: Secondary | ICD-10-CM | POA: Insufficient documentation

## 2016-09-23 DIAGNOSIS — N6321 Unspecified lump in the left breast, upper outer quadrant: Secondary | ICD-10-CM | POA: Diagnosis not present

## 2016-09-23 DIAGNOSIS — Z170 Estrogen receptor positive status [ER+]: Secondary | ICD-10-CM | POA: Diagnosis not present

## 2016-09-23 MED ORDER — LIDOCAINE-EPINEPHRINE (PF) 1 %-1:200000 IJ SOLN
INTRAMUSCULAR | Status: AC
  Filled 2016-09-23: qty 30

## 2016-09-23 MED ORDER — LIDOCAINE HCL (PF) 1 % IJ SOLN
INTRAMUSCULAR | Status: AC
  Filled 2016-09-23: qty 5

## 2016-09-25 ENCOUNTER — Telehealth: Payer: Self-pay | Admitting: *Deleted

## 2016-09-25 NOTE — Telephone Encounter (Signed)
Confirmed [facility] for 10/01/16 at 0815 .  Instructions and contact information given.  

## 2016-09-30 ENCOUNTER — Other Ambulatory Visit: Payer: Self-pay | Admitting: *Deleted

## 2016-09-30 DIAGNOSIS — C50412 Malignant neoplasm of upper-outer quadrant of left female breast: Secondary | ICD-10-CM | POA: Insufficient documentation

## 2016-09-30 DIAGNOSIS — Z170 Estrogen receptor positive status [ER+]: Principal | ICD-10-CM

## 2016-10-01 ENCOUNTER — Encounter: Payer: Self-pay | Admitting: Physical Therapy

## 2016-10-01 ENCOUNTER — Ambulatory Visit
Admission: RE | Admit: 2016-10-01 | Discharge: 2016-10-01 | Disposition: A | Discharge status: Home / Self Care | Payer: Medicare Other | Source: Ambulatory Visit | Attending: Radiation Oncology | Admitting: Radiation Oncology

## 2016-10-01 ENCOUNTER — Ambulatory Visit: Payer: Medicare Other | Attending: General Surgery | Admitting: Physical Therapy

## 2016-10-01 ENCOUNTER — Ambulatory Visit (HOSPITAL_BASED_OUTPATIENT_CLINIC_OR_DEPARTMENT_OTHER): Payer: Medicare Other | Admitting: Oncology

## 2016-10-01 ENCOUNTER — Other Ambulatory Visit (HOSPITAL_BASED_OUTPATIENT_CLINIC_OR_DEPARTMENT_OTHER): Payer: Medicare Other

## 2016-10-01 ENCOUNTER — Encounter: Payer: Self-pay | Admitting: Oncology

## 2016-10-01 VITALS — BP 130/52 | HR 79 | Temp 97.8°F | Resp 18 | Ht 65.0 in | Wt 226.9 lb

## 2016-10-01 DIAGNOSIS — J45909 Unspecified asthma, uncomplicated: Secondary | ICD-10-CM | POA: Insufficient documentation

## 2016-10-01 DIAGNOSIS — Z880 Allergy status to penicillin: Secondary | ICD-10-CM | POA: Insufficient documentation

## 2016-10-01 DIAGNOSIS — C50412 Malignant neoplasm of upper-outer quadrant of left female breast: Secondary | ICD-10-CM | POA: Diagnosis not present

## 2016-10-01 DIAGNOSIS — I10 Essential (primary) hypertension: Secondary | ICD-10-CM | POA: Insufficient documentation

## 2016-10-01 DIAGNOSIS — Z8673 Personal history of transient ischemic attack (TIA), and cerebral infarction without residual deficits: Secondary | ICD-10-CM | POA: Insufficient documentation

## 2016-10-01 DIAGNOSIS — Z90710 Acquired absence of both cervix and uterus: Secondary | ICD-10-CM | POA: Insufficient documentation

## 2016-10-01 DIAGNOSIS — Z882 Allergy status to sulfonamides status: Secondary | ICD-10-CM | POA: Insufficient documentation

## 2016-10-01 DIAGNOSIS — F419 Anxiety disorder, unspecified: Secondary | ICD-10-CM | POA: Insufficient documentation

## 2016-10-01 DIAGNOSIS — E1140 Type 2 diabetes mellitus with diabetic neuropathy, unspecified: Secondary | ICD-10-CM | POA: Insufficient documentation

## 2016-10-01 DIAGNOSIS — Z170 Estrogen receptor positive status [ER+]: Secondary | ICD-10-CM

## 2016-10-01 DIAGNOSIS — Z171 Estrogen receptor negative status [ER-]: Secondary | ICD-10-CM | POA: Insufficient documentation

## 2016-10-01 DIAGNOSIS — Z7984 Long term (current) use of oral hypoglycemic drugs: Secondary | ICD-10-CM | POA: Insufficient documentation

## 2016-10-01 DIAGNOSIS — R293 Abnormal posture: Secondary | ICD-10-CM | POA: Insufficient documentation

## 2016-10-01 DIAGNOSIS — Z79899 Other long term (current) drug therapy: Secondary | ICD-10-CM | POA: Insufficient documentation

## 2016-10-01 DIAGNOSIS — F329 Major depressive disorder, single episode, unspecified: Secondary | ICD-10-CM | POA: Insufficient documentation

## 2016-10-01 DIAGNOSIS — K219 Gastro-esophageal reflux disease without esophagitis: Secondary | ICD-10-CM | POA: Insufficient documentation

## 2016-10-01 DIAGNOSIS — Z885 Allergy status to narcotic agent status: Secondary | ICD-10-CM | POA: Insufficient documentation

## 2016-10-01 DIAGNOSIS — M797 Fibromyalgia: Secondary | ICD-10-CM | POA: Insufficient documentation

## 2016-10-01 DIAGNOSIS — Z510 Encounter for antineoplastic radiation therapy: Secondary | ICD-10-CM | POA: Insufficient documentation

## 2016-10-01 DIAGNOSIS — D0512 Intraductal carcinoma in situ of left breast: Secondary | ICD-10-CM | POA: Diagnosis not present

## 2016-10-01 DIAGNOSIS — Z801 Family history of malignant neoplasm of trachea, bronchus and lung: Secondary | ICD-10-CM | POA: Insufficient documentation

## 2016-10-01 LAB — COMPREHENSIVE METABOLIC PANEL
ALT: 44 U/L (ref 0–55)
ANION GAP: 11 meq/L (ref 3–11)
AST: 30 U/L (ref 5–34)
Albumin: 3.8 g/dL (ref 3.5–5.0)
Alkaline Phosphatase: 71 U/L (ref 40–150)
BILIRUBIN TOTAL: 0.87 mg/dL (ref 0.20–1.20)
BUN: 19.4 mg/dL (ref 7.0–26.0)
CHLORIDE: 101 meq/L (ref 98–109)
CO2: 27 meq/L (ref 22–29)
Calcium: 9.9 mg/dL (ref 8.4–10.4)
Creatinine: 1.2 mg/dL — ABNORMAL HIGH (ref 0.6–1.1)
EGFR: 51 mL/min/{1.73_m2} — AB (ref >90)
Glucose: 267 mg/dl — ABNORMAL HIGH (ref 70–140)
POTASSIUM: 4.2 meq/L (ref 3.5–5.1)
Sodium: 139 mEq/L (ref 136–145)
Total Protein: 6.9 g/dL (ref 6.4–8.3)

## 2016-10-01 LAB — CBC WITH DIFFERENTIAL/PLATELET
BASO%: 0.4 % (ref 0.0–2.0)
Basophils Absolute: 0 10*3/uL (ref 0.0–0.1)
EOS ABS: 0.1 10*3/uL (ref 0.0–0.5)
EOS%: 1.1 % (ref 0.0–7.0)
HCT: 41.2 % (ref 34.8–46.6)
HGB: 14.1 g/dL (ref 11.6–15.9)
LYMPH%: 42.5 % (ref 14.0–49.7)
MCH: 30.9 pg (ref 25.1–34.0)
MCHC: 34.2 g/dL (ref 31.5–36.0)
MCV: 90.4 fL (ref 79.5–101.0)
MONO#: 0.8 10*3/uL (ref 0.1–0.9)
MONO%: 9.9 % (ref 0.0–14.0)
NEUT#: 3.7 10*3/uL (ref 1.5–6.5)
NEUT%: 46.1 % (ref 38.4–76.8)
PLATELETS: 188 10*3/uL (ref 145–400)
RBC: 4.56 10*6/uL (ref 3.70–5.45)
RDW: 13.5 % (ref 11.2–14.5)
WBC: 7.9 10*3/uL (ref 3.9–10.3)
lymph#: 3.4 10*3/uL — ABNORMAL HIGH (ref 0.9–3.3)

## 2016-10-01 NOTE — Therapy (Signed)
[place], [place], [address] Phone: [phone]   Fax:  [phone]  Physical Therapy Evaluation  Patient Details  Name: [name] MRN: [id number] Date of Birth: 20-May-1956 Referring Provider: Dr. [name]  Encounter Date: 10/01/2016      PT End of Session - 10/01/16 1110    Visit Number 1   Number of Visits 1   PT Start Time 0935   PT Stop Time 1010   PT Time Calculation (min) 35 min   Activity Tolerance Patient tolerated treatment well   Behavior During Therapy [facility] for tasks assessed/performed      Past Medical History:  Diagnosis Date  . Asthma   . Complication of anesthesia    pt states she is hard to wake up after anesthetic  . Diabetes mellitus   . Dyspnea   . Fibromyalgia   . GERD (gastroesophageal reflux disease)   . Hypertension   . Stroke [facility])    mini stroke 2008    Past Surgical History:  Procedure Laterality Date  . ABDOMINAL HYSTERECTOMY    . Back surgery 4 yrs ago  2008  . BALLOON DILATION  10/02/2011   Procedure: BALLOON DILATION;  Surgeon: [name], MD;  [facility];  Service: Endoscopy;  Laterality: N/A;  . CHOLECYSTECTOMY    . COLONOSCOPY WITH PROPOFOL N/A 04/18/2016   Procedure: COLONOSCOPY WITH PROPOFOL;  Surgeon: [name], MD;  Location: [facility];  Service: Endoscopy;  Laterality: N/A;  955  . MALONEY DILATION  10/02/2011   Procedure: MALONEY DILATION;  Surgeon: [name], MD;  [facility];  Service: Endoscopy;  Laterality: N/A;  . SAVORY DILATION  10/02/2011   Procedure: SAVORY DILATION;  Surgeon: [name], MD;  [facility];  Service: Endoscopy;  Laterality: N/A;    There were no vitals filed for this visit.       Subjective Assessment - 10/01/16 1015    Subjective Patient reports she is here today to be seen by her medical team for her newly diagnosed left breast cancer.   Patient is accompained by: Family member   Pertinent History Patient was diagnosed on 08/28/16 with left grade 1 invasive ductal carcinoma breast cancer. It measures 9 mm and is located in the upper outer quadrant. It is ER/PR positive and HER2 negative with a Ki67 of 10%.    Patient Stated Goals Reduce lymphedema risk and learn post op shoulder ROM HEP   Currently in Pain? Yes   Pain Score 6    Pain Location Back   Pain Orientation Mid;Lower   Pain Descriptors / Indicators Constant   Pain Type Chronic pain   Pain Onset More than a month ago   Pain Frequency Constant   Aggravating Factors  Unknown   Pain Relieving Factors Pain patch / medications   Multiple Pain Sites No            [facility] PT Assessment - 10/01/16 0001      Assessment   Medical Diagnosis Left breast cancer   Referring Provider Dr. [name]   Onset Date/Surgical Date 08/28/16   Hand Dominance Left   Prior Therapy none     Precautions   Precautions Other (comment)     Restrictions   Weight Bearing Restrictions No     Balance Screen   Has the patient fallen in the past 6 months No   Has the patient  had a decrease in activity level because of a fear of falling?  No   Is the patient reluctant to leave their home because of a fear of falling?  No     Home Environment   Living Environment Private residence   Living Arrangements Alone   Available Help at Discharge Family     Prior Function   Level of Independence Independent   Vocation On disability   Vocation Requirements On disability due to back problems   Leisure She was doing water exercise 3x/week until she had pneumonia in 2/18 and then had to stop. Also did Nustep and bike for 30 min but none recent.     Cognition   Overall Cognitive Status Within Functional Limits for tasks assessed     Posture/Postural Control   Posture/Postural Control Postural limitations   Postural Limitations Rounded Shoulders;Forward head;Increased thoracic  kyphosis;Decreased lumbar lordosis     ROM / Strength   AROM / PROM / Strength AROM;Strength     AROM   AROM Assessment Site Shoulder;Cervical   Right/Left Shoulder Right;Left   Right Shoulder Extension 38 Degrees   Right Shoulder Flexion 119 Degrees   Right Shoulder ABduction 124 Degrees   Right Shoulder Internal Rotation 58 Degrees   Right Shoulder External Rotation 87 Degrees   Left Shoulder Extension 42 Degrees   Left Shoulder Flexion 104 Degrees   Left Shoulder ABduction 105 Degrees   Left Shoulder Internal Rotation 51 Degrees   Left Shoulder External Rotation 79 Degrees   Cervical Flexion 25% limited   Cervical Extension 50% limited   Cervical - Right Side Bend 75% limited   Cervical - Left Side Bend 75% limited   Cervical - Right Rotation 25% limited   Cervical - Left Rotation 50% limited     Strength   Overall Strength Within functional limits for tasks performed           LYMPHEDEMA/ONCOLOGY QUESTIONNAIRE - 10/01/16 1107      Type   Cancer Type Left breast cancer     Lymphedema Assessments   Lymphedema Assessments Upper extremities     Right Upper Extremity Lymphedema   10 cm Proximal to Olecranon Process 37.7 cm   Olecranon Process 29 cm   10 cm Proximal to Ulnar Styloid Process 26.3 cm   Just Proximal to Ulnar Styloid Process 17 cm   Across Hand at [employer] 20 cm   At [place] of 2nd Digit 6.2 cm     Left Upper Extremity Lymphedema   10 cm Proximal to Olecranon Process 37 cm   Olecranon Process 30 cm   10 cm Proximal to Ulnar Styloid Process 24.8 cm   Just Proximal to Ulnar Styloid Process 16.9 cm   Across Hand at [employer] 20 cm   At [place] of 2nd Digit 6.2 cm          Patient was instructed today in a home exercise program today for post op shoulder range of motion. These included active assist shoulder flexion in sitting, scapular retraction, wall walking with shoulder abduction, and hands behind head external rotation.  She was  encouraged to do these twice a day, holding 3 seconds and repeating 5 times when permitted by her physician.           PT Education - 10/01/16 1109    Education provided Yes   Education Details Lymphedema risk reduction and post op shoulder ROM HEP   Person(s) Educated Patient;Other (comment)  Multiple  family members   Methods Explanation;Demonstration;Handout   Comprehension Returned demonstration;Verbalized understanding              Breast Clinic Goals - 10/01/16 1119      Patient will be able to verbalize understanding of pertinent lymphedema risk reduction practices relevant to her diagnosis specifically related to skin care.   Time 1   Period Days   Status Achieved     Patient will be able to return demonstrate and/or verbalize understanding of the post-op home exercise program related to regaining shoulder range of motion.   Time 1   Period Days   Status Achieved     Patient will be able to verbalize understanding of the importance of attending the postoperative After Breast Cancer Class for further lymphedema risk reduction education and therapeutic exercise.   Time 1   Period Days   Status Achieved              Plan - 10/01/16 1110    Clinical Impression Statement Patient was diagnosed on 08/28/16 with left grade 1 invasive ductal carcinoma breast cancer. It measures 9 mm and is located in the upper outer quadrant. It is ER/PR positive and HER2 negative with a Ki67 of 10%. Her multidisciplinary medical team met prior to her assessments to determine a recommended treatment plan. She is planning to have a left lumpectomy and sentinel node biopsy followed by Oncotype testing, radiation, and anti-estrogen therapy. She may benefit from post op PT to regain shoulder ROM and reduce lymphedema risk. She had a spinal fusion with complications in 4098 resulting in a 2 month hospitalization and needing to learn to walk again. She has also had right leg swelling for 35  years which is possibly primary lymphedema. She would benefit from PT for these comorbidities as well and will follow up after srugery if she decides to pursue PT. Due to these comorbidities, her eval is of moderate complexity.   Rehab Potential Good   Clinical Impairments Affecting Rehab Potential Above stated comorbidities   PT Frequency One time visit   PT Treatment/Interventions Therapeutic exercise;Patient/family education   PT Next Visit Plan Will f/u after surgery to determine PT needs   PT Home Exercise Plan Post op shoulder ROM HEP   Consulted and Agree with Plan of Care Patient;Family member/caregiver      Patient will benefit from skilled therapeutic intervention in order to improve the following deficits and impairments:  Postural dysfunction, Pain, Impaired UE functional use, Decreased range of motion, Decreased knowledge of precautions, Increased edema, Decreased activity tolerance  Visit Diagnosis: Carcinoma of upper-outer quadrant of left breast in female, estrogen receptor negative ([place]) - Plan: PT plan of care cert/re-cert  Abnormal posture - Plan: PT plan of care cert/re-cert      G-Codes - 11/91/47 1155    Functional Assessment Tool Used (Outpatient Only) Clinical Judgement   Functional Limitation Other PT primary   Other PT Primary Current Status (W2956) At least 20 percent but less than 40 percent impaired, limited or restricted   Other PT Primary Goal Status (O1308) At least 20 percent but less than 40 percent impaired, limited or restricted   Other PT Primary Discharge Status (M5784) At least 20 percent but less than 40 percent impaired, limited or restricted     Patient will follow up at outpatient cancer rehab if needed following surgery.  If the patient requires physical therapy at that time, a specific plan will be dictated and sent to the referring  physician for approval. The patient was educated today on appropriate basic range of motion exercises to begin  post operatively and the importance of attending the After Breast Cancer class following surgery.  Patient was educated today on lymphedema risk reduction practices as it pertains to recommendations that will benefit the patient immediately following surgery.  She verbalized good understanding.  No additional physical therapy is indicated at this time.     Problem List Patient Active Problem List   Diagnosis Date Noted  . Malignant neoplasm of upper-outer quadrant of left breast in female, estrogen receptor positive ([place]) 09/30/2016  . Hx of colonic polyps 03/27/2016  . Dysphagia 09/03/2011  . Chest pain 01/02/2011  . Edema 01/02/2011  . WOUND INFECTION 10/20/2007  . [place] DISEASE 10/20/2007  . LAMINECTOMY, LUMBAR, HX OF 10/20/2007    Annia Friendly, PT 10/01/16 11:57 AM  [place], [place], [address] Phone: [phone]   Fax:  [phone]  Name: [name] MRN: [id number] Date of Birth: 1955/09/07

## 2016-10-01 NOTE — Progress Notes (Signed)
[place]  [phone] [phone]     ID: [name] DOB: 09-26-1955  MR#: [id number]  MMN#:[id number]  Patient Care Team: Redmond School, MD as PCP - General (Internal Medicine) [name], MD as Consulting Physician (General Surgery) Chauncey Cruel, MD as Consulting Physician (Oncology) [name], MD as Consulting Physician (Radiation Oncology) Chauncey Cruel, MD OTHER MD:  CHIEF COMPLAINT: Estrogen receptor positive breast cancer  CURRENT TREATMENT: Awaiting definitive surgery   BREAST CANCER HISTORY: [name] had bilateral screening mammography with tomography at [facility] 08/28/2016. There was a possible mass in the left breast and on 09/16/2016 she was brought back for left diagnostic mammography with tomography and left breast ultrasonography. The left breast density was category B. In the left breast there was an irregular mass with architectural distortion in the upper outer quadrant, which appear to measure approximately 0.8 cm. Thank you by ultrasound this measured 0.9 cm and it was located at the 2:00 axis 7 cm from the nipple. Ultrasound of the axilla was reported as negative.   Biopsy of the left breast mass in question 09/23/2016 showed [facility] [phone]) ductal carcinoma in situ, E-cadherin positive, estrogen receptor 100% positive with strong staining intensity, progesterone receptor 90% positive with moderate staining intensity, with an MIB-1 of 10%, and no HER-2 amplification, signals ratio being 1.05 and the number per cell 1.10.  Her subsequent history is as detailed below  INTERVAL HISTORY: She was evaluated in the multidisciplinary breast cancer conference 10/01/2016 accompanied by her son [name], her (separated) husband [name], her niece [name], her sister [name] and her friend [name]. Her case was also presented in the multidisciplinary breast cancer conference that same morning. At that time a preliminary  plan was proposed: Breast conserving surgery with sentinel lymph node sampling, Oncotype, radiation, and anti-estrogens.  REVIEW OF SYSTEMS: There were no specific symptoms leading to the original mammogram, which was routinely scheduled. The patient denies unusual headaches, visual changes, nausea, vomiting, stiff neck, dizziness, or gait imbalance. There has been no cough, phlegm production, or pleurisy, no chest pain or pressure, and no change in bowel or bladder habits. The patient denies fever, rash, bleeding, unexplained fatigue or unexplained weight loss. A detailed review of systems was otherwise entirely negative.   PAST MEDICAL HISTORY: Past Medical History:  Diagnosis Date  . Asthma   . Complication of anesthesia    pt states she is hard to wake up after anesthetic  . Diabetes mellitus   . Dyspnea   . Fibromyalgia   . GERD (gastroesophageal reflux disease)   . Hypertension   . Stroke [facility])    mini stroke 2008    PAST SURGICAL HISTORY: Past Surgical History:  Procedure Laterality Date  . ABDOMINAL HYSTERECTOMY    . Back surgery 4 yrs ago  2008  . BALLOON DILATION  10/02/2011   Procedure: BALLOON DILATION;  Surgeon: [name], MD;  Location[facility];  Service: Endoscopy;  Laterality: N/A;  . CHOLECYSTECTOMY    . COLONOSCOPY WITH PROPOFOL N/A 04/18/2016   Procedure: COLONOSCOPY WITH PROPOFOL;  Surgeon: [name], MD;  Location: [facility];  Service: Endoscopy;  Laterality: N/A;  955  . MALONEY DILATION  10/02/2011   Procedure: MALONEY DILATION;  Surgeon: [name], MD;  Location[facility];  Service: Endoscopy;  Laterality: N/A;  . SAVORY DILATION  10/02/2011   Procedure: SAVORY DILATION;  Surgeon: [name], MD;  Location: [facility];  Service: Endoscopy;  Laterality: N/A;    FAMILY HISTORY Family History  Problem Relation Age of Onset  . Lung cancer Father   The patient's father died at the age of 43, 4 years after being diagnosed  with lung cancer in the setting of tobacco abuse. The patient's mother died at the age of 105 from a heart attack. The patient had 6 brothers, 3 sisters. There is no history of breast or ovarian cancer in the family to her knowledge.  GYNECOLOGIC HISTORY:  No LMP recorded. Patient has had a hysterectomy. Menarche age 27, first live birth age 22, the patient is [place] P2. She underwent hysterectomy with unilateral salpingo-oophorectomy at age 61. She used estradiol as hormone replacement until her diagnosis of breast cancer in April 2018.  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 She also has significant fibromyalgia. She is separated but not divorced from her husband [name]. He has significant medical problems. The patient's son K Pennelope Bracken lives in [place]; he works at [employer]. The patient tells me [name] was married in [place] because it was his husband's dream. She greatly enjoyed the wedding. Son Garth Schlatter lives in [place] and works in a Human resources officer. The patient has no grandchildren. She is a Psychologist, forensic.    ADVANCED DIRECTIVES: Not in place   HEALTH MAINTENANCE: Social History  Substance Use Topics  . Smoking status: Never Smoker  . Smokeless tobacco: Never Used  . Alcohol use No     Colonoscopy:  PAP:  Bone density:   Allergies  Allergen Reactions  . Penicillins Other (See Comments)    Upset stomach  . Adhesive [Tape] Other (See Comments)    Tears skin  . Amoxicillin-Pot Clavulanate Nausea And Vomiting  . Codeine Itching  . Sulfonamide Derivatives Other (See Comments)    Burning sensation    Current Outpatient Prescriptions  Medication Sig Dispense Refill  . ALPRAZolam (XANAX) 1 MG tablet Take 1 mg by mouth 4 (four) times daily as needed.     Marland Kitchen atenolol (TENORMIN) 25 MG tablet Take 25 mg by mouth daily.     Marland Kitchen glimepiride (AMARYL) 4 MG tablet Take 4 mg by mouth 2 (two) times daily.    Marland Kitchen  lidocaine (LIDODERM) 5 % Place 1 patch onto the skin daily as needed (pain). Remove & Discard patch within 12 hours or as directed by MD     . losartan (COZAAR) 25 MG tablet Take 25 mg by mouth daily.    . metFORMIN (GLUCOPHAGE) 1000 MG tablet Take 1,000 mg by mouth 2 (two) times daily.     . montelukast (SINGULAIR) 10 MG tablet     . Oxycodone HCl 10 MG TABS Take 10 mg by mouth every 6 (six) hours as needed (pain).     Marland Kitchen terbinafine (LAMISIL) 1 % cream Apply 1 application topically 2 (two) times daily. For feet    . albuterol (PROVENTIL HFA;VENTOLIN HFA) 108 (90 BASE) MCG/ACT inhaler Inhale 2 puffs into the lungs every 6 (six) hours as needed for wheezing or shortness of breath.    . bisacodyl (DULCOLAX) 5 MG EC tablet Take 10 mg by mouth daily as needed for moderate constipation.    . docusate sodium (COLACE) 100 MG capsule Take 100 mg by mouth daily.    Marland Kitchen estradiol (ESTRACE) 0.5 MG tablet Take 0.5 mg by mouth daily.      Marland Kitchen glipiZIDE (GLUCOTROL XL)  10 MG 24 hr tablet Take 10 mg by mouth 2 (two) times daily.     . pregabalin (LYRICA) 75 MG capsule Take 75 mg by mouth 2 (two) times daily as needed (pain).     . RaNITidine HCl (ZANTAC PO) Take 1 tablet by mouth daily as needed (heartburn).     No current facility-administered medications for this visit.     OBJECTIVE: Middle-aged white woman in no acute distress Vitals:   10/01/16 0826  BP: (!) 130/52  Pulse: 79  Resp: 18  Temp: 97.8 F (36.6 C)     Body mass index is 37.76 kg/m.    ECOG FS:1 - Symptomatic but completely ambulatory  Ocular: Sclerae unicteric, pupils equal, round and reactive to light Ear-nose-throat: Oropharynx clear and moist Lymphatic: No cervical or supraclavicular adenopathy Lungs no rales or rhonchi, good excursion bilaterally Heart regular rate and rhythm, no murmur appreciated Abd soft, obese, nontender, positive bowel sounds MSK no focal spinal tenderness, no joint edema Neuro: non-focal, well-oriented,  appropriate affect Breasts: The right breast is unremarkable. The left breast is status post recent biopsy. I do not palpate a well-defined mass. There are no skin or nipple changes of concern. Both axillae are benign.   LAB RESULTS:  CMP     Component Value Date/Time   NA 139 10/01/2016 0806   K 4.2 10/01/2016 0806   CL 104 04/11/2016 1108   CO2 27 10/01/2016 0806   GLUCOSE 267 (H) 10/01/2016 0806   BUN 19.4 10/01/2016 0806   CREATININE 1.2 (H) 10/01/2016 0806   CALCIUM 9.9 10/01/2016 0806   PROT 6.9 10/01/2016 0806   ALBUMIN 3.8 10/01/2016 0806   AST 30 10/01/2016 0806   ALT 44 10/01/2016 0806   ALKPHOS 71 10/01/2016 0806   BILITOT 0.87 10/01/2016 0806   GFRNONAA >60 04/11/2016 1108   GFRAA >60 04/11/2016 1108    No results found for: TOTALPROTELP, ALBUMINELP, A1GS, A2GS, BETS, BETA2SER, GAMS, MSPIKE, SPEI  No results found for: Nils Pyle, [facility]  Lab Results  Component Value Date   WBC 7.9 10/01/2016   NEUTROABS 3.7 10/01/2016   HGB 14.1 10/01/2016   HCT 41.2 10/01/2016   MCV 90.4 10/01/2016   PLT 188 10/01/2016      Chemistry      Component Value Date/Time   NA 139 10/01/2016 0806   K 4.2 10/01/2016 0806   CL 104 04/11/2016 1108   CO2 27 10/01/2016 0806   BUN 19.4 10/01/2016 0806   CREATININE 1.2 (H) 10/01/2016 0806      Component Value Date/Time   CALCIUM 9.9 10/01/2016 0806   ALKPHOS 71 10/01/2016 0806   AST 30 10/01/2016 0806   ALT 44 10/01/2016 0806   BILITOT 0.87 10/01/2016 0806       No results found for: LABCA2  No components found for: [id number]  No results for input(s): INR in the last 168 hours.  Urinalysis    Component Value Date/Time   COLORURINE YELLOW 07/30/2014 1600   APPEARANCEUR CLEAR 07/30/2014 1600   LABSPEC 1.020 07/30/2014 1600   PHURINE 5.5 07/30/2014 1600   GLUCOSEU >1000 (A) 07/30/2014 1600   HGBUR NEGATIVE 07/30/2014 1600   BILIRUBINUR NEGATIVE 07/30/2014 1600   KETONESUR NEGATIVE 07/30/2014  1600   PROTEINUR NEGATIVE 07/30/2014 1600   UROBILINOGEN 0.2 07/30/2014 1600   NITRITE NEGATIVE 07/30/2014 1600   LEUKOCYTESUR NEGATIVE 07/30/2014 1600     STUDIES: US Breast Ltd Uni Left Inc Axilla  Addendum Date: 09/17/2016  ADDENDUM REPORT: 09/17/2016 08:27 ADDENDUM: Left axilla was evaluated with ultrasound showing no enlarged or morphologically abnormal lymph nodes. Electronically Signed   By: Franki Cabot M.D.   On: 09/17/2016 08:27   Result Date: 09/17/2016 CLINICAL DATA:  Patient returns today to evaluate a possible left breast distortion identified on recent screening mammogram. EXAM: 2D DIGITAL DIAGNOSTIC LEFT MAMMOGRAM WITH CAD AND ADJUNCT TOMO ULTRASOUND LEFT BREAST COMPARISON:  Previous exams including recent screening mammogram dated 08/28/2016. ACR Breast Density Category b: There are scattered areas of fibroglandular density. FINDINGS: On today's additional views with spot compression and 3D tomosynthesis, an irregular mass with associated architectural distortion is confirmed within the upper-outer quadrant of the left breast, at middle to posterior depth, measuring approximately 8 mm greatest dimension. Mammographic images were processed with CAD. Targeted ultrasound is performed, showing a spiculated mass in the left breast at the 2 o'clock axis, 7 cm from the nipple, measuring 9 x 6 x 7 mm, corresponding to the mammographic finding. IMPRESSION: Spiculated mass within the left breast at the 2 o'clock axis, 7 cm from the nipple, measuring 9 x 6 x 7 mm, corresponding to the mammographic finding, for which ultrasound-guided biopsy is recommended. RECOMMENDATION: Ultrasound-guided biopsy for the left breast mass at the 2 o'clock axis. Ultrasound-guided biopsy is scheduled for April 10th. I have discussed the findings and recommendations with the patient. Results were also provided in writing at the conclusion of the visit. If applicable, a reminder letter will be sent to the patient  regarding the next appointment. BI-RADS CATEGORY  4: Suspicious. Electronically Signed: By: Franki Cabot M.D. On: 09/16/2016 09:57   Mm Diag Breast Tomo Uni Left  Addendum Date: 09/17/2016   ADDENDUM REPORT: 09/17/2016 08:27 ADDENDUM: Left axilla was evaluated with ultrasound showing no enlarged or morphologically abnormal lymph nodes. Electronically Signed   By: Franki Cabot M.D.   On: 09/17/2016 08:27   Result Date: 09/17/2016 CLINICAL DATA:  Patient returns today to evaluate a possible left breast distortion identified on recent screening mammogram. EXAM: 2D DIGITAL DIAGNOSTIC LEFT MAMMOGRAM WITH CAD AND ADJUNCT TOMO ULTRASOUND LEFT BREAST COMPARISON:  Previous exams including recent screening mammogram dated 08/28/2016. ACR Breast Density Category b: There are scattered areas of fibroglandular density. FINDINGS: On today's additional views with spot compression and 3D tomosynthesis, an irregular mass with associated architectural distortion is confirmed within the upper-outer quadrant of the left breast, at middle to posterior depth, measuring approximately 8 mm greatest dimension. Mammographic images were processed with CAD. Targeted ultrasound is performed, showing a spiculated mass in the left breast at the 2 o'clock axis, 7 cm from the nipple, measuring 9 x 6 x 7 mm, corresponding to the mammographic finding. IMPRESSION: Spiculated mass within the left breast at the 2 o'clock axis, 7 cm from the nipple, measuring 9 x 6 x 7 mm, corresponding to the mammographic finding, for which ultrasound-guided biopsy is recommended. RECOMMENDATION: Ultrasound-guided biopsy for the left breast mass at the 2 o'clock axis. Ultrasound-guided biopsy is scheduled for April 10th. I have discussed the findings and recommendations with the patient. Results were also provided in writing at the conclusion of the visit. If applicable, a reminder letter will be sent to the patient regarding the next appointment. BI-RADS CATEGORY   4: Suspicious. Electronically Signed: By: Franki Cabot M.D. On: 09/16/2016 09:57   Mm Clip Placement Left  Result Date: 09/23/2016 CLINICAL DATA:  Ultrasound-guided biopsy was performed of a 7 mm mass in the 2 o'clock position of  the left breast. EXAM: DIAGNOSTIC LEFT MAMMOGRAM POST ULTRASOUND BIOPSY COMPARISON:  Previous exam(s). FINDINGS: Mammographic images were obtained following ultrasound guided biopsy of a left breast mass at 2 o'clock position 7 cm from the nipple. A ribbon shaped biopsy clip is satisfactorily positioned in the left breast mass. IMPRESSION: Satisfactory position of ribbon shaped biopsy clip. Final Assessment: Post Procedure Mammograms for Marker Placement Electronically Signed   By: Curlene Dolphin M.D.   On: 09/23/2016 09:03   [place] Lt Breast Bx W Loc Dev 1st Lesion Img Bx Spec US Guide  Addendum Date: 09/26/2016   ADDENDUM REPORT: 09/25/2016 12:23 ADDENDUM: Pathology of the left breast biopsy revealed INVASIVE AND IN SITU MAMMARY CARCINOMA. 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 This was found to be concordant by Dr. Theodosia Blender impression and notes. Recommendation:  Surgical and oncology referral. At the patient's request, results and recommendations were relayed to the patient by phone by Dr. Theda Sers on 09/24/16. The patient stated she has done well following the biopsy. All of her questions were answered. The patient requests referrals to providers in [place] and would like to be referred to the multidisciplinary clinic. Patient information was sent to the multidisciplinary clinic on 09/24/16 by Terie Purser, RN, nurse navigator for the [place]. The patient will be contacted by someone in the multidisciplinary clinic who will arrange the appointment directly with the patient. The patient was notified of this by Jetta Lout, [place]. She was encouraged to call the [place] with any further questions or concerns. Addendum by Jetta Lout, RRA on 09/25/16. Electronically Signed   By: Ammie Ferrier M.D.   On: 09/25/2016 12:23   Result Date: 09/26/2016 CLINICAL DATA:  61 year old patient with left breast mass identified on recent screening mammography. Biopsy was recommended of a 7 mm mass in the 2 o'clock position of the left breast. EXAM: ULTRASOUND GUIDED LEFT BREAST CORE NEEDLE BIOPSY COMPARISON:  Previous exam(s). FINDINGS: I met with the patient and we discussed the procedure of ultrasound-guided biopsy, including benefits and alternatives. We discussed the high likelihood of a successful procedure. We discussed the risks of the procedure, including infection, bleeding, tissue injury, clip migration, and inadequate sampling. Informed written consent was given. The usual time-out protocol was performed immediately prior to the procedure. Lesion quadrant: Upper-outer quadrant Using sterile technique and 1% Lidocaine as local anesthetic, under direct ultrasound visualization, a 12 gauge spring-loaded device was used to perform biopsy of a hypoechoic irregular 7 mm mass using a lateral to medial approach. At the conclusion of the procedure a tissue marker clip was deployed into the biopsy cavity. Follow up 2 view mammogram was performed and dictated separately. IMPRESSION: Ultrasound guided biopsy of the left breast. No apparent complications. Electronically Signed: By: Curlene Dolphin M.D. On: 09/23/2016 08:57    ELIGIBLE FOR AVAILABLE RESEARCH PROTOCOL: no  ASSESSMENT: 61 y.o. [place], [place] woman status post left breast upper outer quadrant biopsy 09/23/2016 for a clinical T1b N0, [place] t invasive ductal carcinoma, grade 1, estrogen and progesterone receptor positive, HER-2 not amplified, with an MIB-1 of 10%  (1) breast conserving surgery with sentinel lymph node sampling pending  (2) Oncotype DX to be obtained from the definitive surgical sample;  chemotherapy is not anticipated  (3) adjuvant radiation to follow  (4) anti-estrogens to follow at the completion of local treatment    PLAN: We spent the better part of today's hour-long appointment discussing  the biology of breast cancer in general, and the specifics of the patient's tumor in particular.We first reviewed the fact that cancer is not one disease but more than 100 different diseases and that it is important to keep them separate-- otherwise when friends and relatives discuss their own cancer experiences with [place] confusion can result. Similarly we explained that if breast cancer spreads to the bone or liver, the patient would not have bone cancer or liver cancer, but breast cancer in the bone and breast cancer in the liver: one cancer in three places-- not 3 different cancers which otherwise would have to be treated in 3 different ways.  We discussed the difference between local and systemic therapy. In terms of loco-regional treatment, lumpectomy plus radiation is equivalent to mastectomy as far as survival is concerned. For this reason, and because the cosmetic results are generally superior, we recommend breast conserving surgery. We also noted that in terms of sequencing of treatments, whether systemic therapy or surgery is done first does not affect the ultimate outcome.  We then discussed the rationale for systemic therapy. There is some risk that this cancer may have already spread to other parts of her body. Patients frequently ask at this point about bone scans, CAT scans and PET scans to find out if they have occult breast cancer somewhere else. The problem is that in early stage disease we are much more likely to find false positives then true cancers and this would expose the patient to unnecessary procedures as well as unnecessary radiation. Scans cannot answer the question the patient really would like to know, which is whether she has microscopic disease elsewhere in her  body. For those reasons we do not recommend them.  Of course we would proceed to aggressive evaluation of any symptoms that might suggest metastatic disease, but that is not the case here.  Next we went over the options for systemic therapy which are anti-estrogens, anti-HER-2 immunotherapy, and chemotherapy. Jahni does not meet criteria for anti-HER-2 immunotherapy. She is a good candidate for anti-estrogens.  The question of chemotherapy is more complicated. Chemotherapy is most effective in rapidly growing, aggressive tumors. It is much less effective in low-grade, slow growing cancers, like Ricki 's. For that reason we are going to request an Oncotype from the definitive surgical sample, as suggested by NCCN guidelines. That will help [place] make a definitive decision regarding chemotherapy in this case.  Accordingly the overall plan is to start with breast conserving surgery, including sentinel lymph node sampling. We will obtain an Oncotype from that sample. She will see me when those results are available and we can make a definitive decision regarding chemotherapy at that time. However she understands my expectation is that she very likely will not need chemotherapy.  She would then proceed to radiation and then return to see me at the completion of her local treatment, when we will start most likely anastrozole.  Ronne has a good understanding of the overall plan. She agrees with it. She knows the goal of treatment in her case is cure. She will call with any problems that may develop before her next visit here.  Chauncey Cruel, MD   10/01/2016 9:38 AM Medical Oncology and Hematology [facility] [address] [place], [place] [address] Tel. [phone]    Fax. [phone]

## 2016-10-01 NOTE — Progress Notes (Signed)
Nutrition Assessment  Reason for Assessment:  Pt seen in Breast Clinic  ASSESSMENT:   61 year old with new diagnosis of left breast cancer.  Past medical history of fibromyalgia, chronic back pain, DM, HTN  Medications:  reviewed  Labs: glucose 267, creatinine 1.2  Anthropometrics:   Height: 66 inches Weight: 226 lb 14.4 oz BMI: 37.8   NUTRITION DIAGNOSIS: Food and nutrition related knowledge deficit related to new diagnosis of breast cancer as evidenced by no prior need for nutrition related information.  INTERVENTION:   Discussed and provided packet of information regarding nutritional tips for breast cancer patients.  Questions answered.  Teachback method used.  Contact information provided and patient knows to contact me with questions/concerns.    MONITORING, EVALUATION, and GOAL: Pt will consume a healthy plant based diet to maintain lean body mass throughout treatment.   [name] B. Zenia Resides, [place], [place] Registered Dietitian [phone] (pager)

## 2016-10-01 NOTE — Patient Instructions (Signed)

## 2016-10-01 NOTE — Progress Notes (Signed)
Radiation Oncology         [phone] ________________________________  Initial Outpatient Consultation  Name: [name] MRN: [id number]  Date: 10/01/2016  DOB: 1955/09/20  AJ:OINOMVEH Elenore Rota, MD  [name], MD   REFERRING PHYSICIAN: [name], MD  DIAGNOSIS:    ICD-9-CM ICD-10-CM   1. 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 female who presented with a left breast mass and distortion on routine screening mammogram at [facility] on 08/28/16. Ultrasound on 09/16/16 showed a 9 x 6 x 7 mm spiculated mass at the 2 o'clock position, 7 cm from the nipple. Biopsy on 09/23/16 showed invasive and in situ mammary carcinoma with characteristics as described above in the diagnosis. Receptor status is ER 100%, PR 90%, HER2 -, and Ki67 10%.   Patient is positive for night sweats, fatigue, pain in legs and feet, glasses, sinus problems, hoarse voice, palpitations, feet swelling, shortness of breath with walking and stairs, bruise easily, back pain, joint pain, difficulty walking, headaches, numbness, anxiety, diabetes, hot flashes, and inhaler.  PREVIOUS RADIATION THERAPY: No  PAST MEDICAL HISTORY:  has a past medical history of Asthma; Complication of anesthesia; Diabetes mellitus; Dyspnea; Fibromyalgia; GERD (gastroesophageal reflux disease); Hypertension; and Stroke [facility]).    PAST SURGICAL HISTORY: Past Surgical History:  Procedure Laterality Date  . ABDOMINAL HYSTERECTOMY    . Back surgery 4 yrs ago  2008  . BALLOON DILATION  10/02/2011   Procedure: BALLOON DILATION;  Surgeon: [name], MD;  [facility];  Service: Endoscopy;   Laterality: N/A;  . CHOLECYSTECTOMY    . COLONOSCOPY WITH PROPOFOL N/A 04/18/2016   Procedure: COLONOSCOPY WITH PROPOFOL;  Surgeon: [name], MD;  Location: [facility];  Service: Endoscopy;  Laterality: N/A;  955  . MALONEY DILATION  10/02/2011   Procedure: MALONEY DILATION;  Surgeon: [name], MD;  Location[facility];  Service: Endoscopy;  Laterality: N/A;  . SAVORY DILATION  10/02/2011   Procedure: SAVORY DILATION;  Surgeon: [name], MD;  Location[facility];  Service: Endoscopy;  Laterality: N/A;    FAMILY HISTORY: family history includes Lung cancer in her father.  SOCIAL HISTORY:  reports that she has never smoked. She has never used smokeless tobacco. She reports that she does not drink alcohol or use drugs.  ALLERGIES: Penicillins; Adhesive [tape]; Amoxicillin-pot clavulanate; Codeine; and Sulfonamide derivatives  MEDICATIONS:  Current Outpatient Prescriptions  Medication Sig Dispense Refill  . albuterol (PROVENTIL HFA;VENTOLIN HFA) 108 (90 BASE) MCG/ACT inhaler Inhale 2 puffs into the lungs every 6 (six) hours as needed for wheezing or shortness of breath.    . ALPRAZolam (XANAX) 1 MG tablet Take 1 mg by mouth 4 (four) times daily as needed.     Marland Kitchen atenolol (TENORMIN) 25 MG tablet Take 25 mg by mouth daily.     . bisacodyl (DULCOLAX) 5 MG EC tablet Take 10 mg by mouth daily as needed for moderate constipation.    . docusate sodium (COLACE) 100 MG capsule Take 100 mg by mouth daily.    Marland Kitchen estradiol (ESTRACE) 0.5 MG tablet Take 0.5 mg by mouth  daily.      . glimepiride (AMARYL) 4 MG tablet Take 4 mg by mouth 2 (two) times daily.    Marland Kitchen glipiZIDE (GLUCOTROL XL) 10 MG 24 hr tablet Take 10 mg by mouth 2 (two) times daily.     Marland Kitchen lidocaine (LIDODERM) 5 % Place 1 patch onto the skin daily as needed (pain). Remove & Discard patch within 12 hours or as directed by MD     . losartan (COZAAR) 25 MG tablet Take 25 mg by mouth daily.    . metFORMIN (GLUCOPHAGE) 1000  MG tablet Take 1,000 mg by mouth 2 (two) times daily.     . montelukast (SINGULAIR) 10 MG tablet     . Oxycodone HCl 10 MG TABS Take 10 mg by mouth every 6 (six) hours as needed (pain).     . pregabalin (LYRICA) 75 MG capsule Take 75 mg by mouth 2 (two) times daily as needed (pain).     . RaNITidine HCl (ZANTAC PO) Take 1 tablet by mouth daily as needed (heartburn).    . terbinafine (LAMISIL) 1 % cream Apply 1 application topically 2 (two) times daily. For feet     No current facility-administered medications for this encounter.     REVIEW OF SYSTEMS: A 10+ POINT REVIEW OF SYSTEMS WAS OBTAINED including neurology, dermatology, psychiatry, cardiac, respiratory, lymph, extremities, GI, GU, Musculoskeletal, constitutional, breasts, reproductive, HEENT.  All pertinent positives are noted in the HPI.  All others are negative.   PHYSICAL EXAM:  Vitals with BMI 10/01/2016  Height '5\' 5"'$   Weight 226 lbs 14 oz  BMI 78.2  Systolic 423  Diastolic 52  Pulse 79  Respirations 18   General: Alert and oriented, in no acute distress HEENT: Head is normocephalic. Extraocular movements are intact. Oropharynx is clear. Neck: Neck is supple, no palpable cervical or supraclavicular lymphadenopathy. Heart: Regular in rate and rhythm with no murmurs, rubs, or gallops. Chest: Clear to auscultation bilaterally, with no rhonchi, wheezes, or rales. Neurologic: Cranial nerves II through XII are grossly intact. No obvious focalities. Speech is fluent. Coordination is intact. Psychiatric: Judgment and insight are intact. Affect is appropriate. Breasts: Left breast patient has some bruising in UOQ of breast. No other palpable masses appreciated in the breasts or axillae. No nipple discharge or bleeding in bilateral breasts.    ECOG = 2  0 - Asymptomatic (Fully active, able to carry on all predisease activities without restriction)  1 - Symptomatic but completely ambulatory (Restricted in physically strenuous  activity but ambulatory and able to carry out work of a light or sedentary nature. For example, light housework, office work)  2 - Symptomatic, <50% in bed during the day (Ambulatory and capable of all self care but unable to carry out any work activities. Up and about more than 50% of waking hours)  3 - Symptomatic, >50% in bed, but not bedbound (Capable of only limited self-care, confined to bed or chair 50% or more of waking hours)  4 - Bedbound (Completely disabled. Cannot carry on any self-care. Totally confined to bed or chair)  5 - Death   Eustace Pen MM, Creech RH, Tormey DC, et al. [id number]). "Toxicity and response criteria of the [facility]". [place] Oncol. 5 (6): 649-55   LABORATORY DATA:  Lab Results  Component Value Date   WBC 7.9 10/01/2016   HGB 14.1 10/01/2016   HCT 41.2 10/01/2016   MCV 90.4 10/01/2016   PLT 188 10/01/2016   CMP  Component Value Date/Time   NA 139 10/01/2016 0806   K 4.2 10/01/2016 0806   CL 104 04/11/2016 1108   CO2 27 10/01/2016 0806   GLUCOSE 267 (H) 10/01/2016 0806   BUN 19.4 10/01/2016 0806   CREATININE 1.2 (H) 10/01/2016 0806   CALCIUM 9.9 10/01/2016 0806   PROT 6.9 10/01/2016 0806   ALBUMIN 3.8 10/01/2016 0806   AST 30 10/01/2016 0806   ALT 44 10/01/2016 0806   ALKPHOS 71 10/01/2016 0806   BILITOT 0.87 10/01/2016 0806   GFRNONAA >60 04/11/2016 1108   GFRAA >60 04/11/2016 1108         RADIOGRAPHY: US Breast Ltd Uni Left Inc Axilla  Addendum Date: 09/17/2016   ADDENDUM REPORT: 09/17/2016 08:27 ADDENDUM: Left axilla was evaluated with ultrasound showing no enlarged or morphologically abnormal lymph nodes. Electronically Signed   By: Franki Cabot M.D.   On: 09/17/2016 08:27   Result Date: 09/17/2016 CLINICAL DATA:  Patient returns today to evaluate a possible left breast distortion identified on recent screening mammogram. EXAM: 2D DIGITAL DIAGNOSTIC LEFT MAMMOGRAM WITH CAD AND ADJUNCT TOMO ULTRASOUND LEFT  BREAST COMPARISON:  Previous exams including recent screening mammogram dated 08/28/2016. ACR Breast Density Category b: There are scattered areas of fibroglandular density. FINDINGS: On today's additional views with spot compression and 3D tomosynthesis, an irregular mass with associated architectural distortion is confirmed within the upper-outer quadrant of the left breast, at middle to posterior depth, measuring approximately 8 mm greatest dimension. Mammographic images were processed with CAD. Targeted ultrasound is performed, showing a spiculated mass in the left breast at the 2 o'clock axis, 7 cm from the nipple, measuring 9 x 6 x 7 mm, corresponding to the mammographic finding. IMPRESSION: Spiculated mass within the left breast at the 2 o'clock axis, 7 cm from the nipple, measuring 9 x 6 x 7 mm, corresponding to the mammographic finding, for which ultrasound-guided biopsy is recommended. RECOMMENDATION: Ultrasound-guided biopsy for the left breast mass at the 2 o'clock axis. Ultrasound-guided biopsy is scheduled for April 10th. I have discussed the findings and recommendations with the patient. Results were also provided in writing at the conclusion of the visit. If applicable, a reminder letter will be sent to the patient regarding the next appointment. BI-RADS CATEGORY  4: Suspicious. Electronically Signed: By: Franki Cabot M.D. On: 09/16/2016 09:57   Mm Diag Breast Tomo Uni Left  Addendum Date: 09/17/2016   ADDENDUM REPORT: 09/17/2016 08:27 ADDENDUM: Left axilla was evaluated with ultrasound showing no enlarged or morphologically abnormal lymph nodes. Electronically Signed   By: Franki Cabot M.D.   On: 09/17/2016 08:27   Result Date: 09/17/2016 CLINICAL DATA:  Patient returns today to evaluate a possible left breast distortion identified on recent screening mammogram. EXAM: 2D DIGITAL DIAGNOSTIC LEFT MAMMOGRAM WITH CAD AND ADJUNCT TOMO ULTRASOUND LEFT BREAST COMPARISON:  Previous exams including  recent screening mammogram dated 08/28/2016. ACR Breast Density Category b: There are scattered areas of fibroglandular density. FINDINGS: On today's additional views with spot compression and 3D tomosynthesis, an irregular mass with associated architectural distortion is confirmed within the upper-outer quadrant of the left breast, at middle to posterior depth, measuring approximately 8 mm greatest dimension. Mammographic images were processed with CAD. Targeted ultrasound is performed, showing a spiculated mass in the left breast at the 2 o'clock axis, 7 cm from the nipple, measuring 9 x 6 x 7 mm, corresponding to the mammographic finding. IMPRESSION: Spiculated mass within the left breast at the 2 o'clock axis,  7 cm from the nipple, measuring 9 x 6 x 7 mm, corresponding to the mammographic finding, for which ultrasound-guided biopsy is recommended. RECOMMENDATION: Ultrasound-guided biopsy for the left breast mass at the 2 o'clock axis. Ultrasound-guided biopsy is scheduled for April 10th. I have discussed the findings and recommendations with the patient. Results were also provided in writing at the conclusion of the visit. If applicable, a reminder letter will be sent to the patient regarding the next appointment. BI-RADS CATEGORY  4: Suspicious. Electronically Signed: By: Franki Cabot M.D. On: 09/16/2016 09:57   Mm Clip Placement Left  Result Date: 09/23/2016 CLINICAL DATA:  Ultrasound-guided biopsy was performed of a 7 mm mass in the 2 o'clock position of the left breast. EXAM: DIAGNOSTIC LEFT MAMMOGRAM POST ULTRASOUND BIOPSY COMPARISON:  Previous exam(s). FINDINGS: Mammographic images were obtained following ultrasound guided biopsy of a left breast mass at 2 o'clock position 7 cm from the nipple. A ribbon shaped biopsy clip is satisfactorily positioned in the left breast mass. IMPRESSION: Satisfactory position of ribbon shaped biopsy clip. Final Assessment: Post Procedure Mammograms for Marker Placement  Electronically Signed   By: Curlene Dolphin M.D.   On: 09/23/2016 09:03   [place] Lt Breast Bx W Loc Dev 1st Lesion Img Bx Spec US Guide  Addendum Date: 09/26/2016   ADDENDUM REPORT: 09/25/2016 12:23 ADDENDUM: Pathology of the left breast biopsy revealed INVASIVE AND IN SITU MAMMARY CARCINOMA. 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 This was found to be concordant by Dr. Theodosia Blender impression and notes. Recommendation:  Surgical and oncology referral. At the patient's request, results and recommendations were relayed to the patient by phone by Dr. Theda Sers on 09/24/16. The patient stated she has done well following the biopsy. All of her questions were answered. The patient requests referrals to providers in [place] and would like to be referred to the multidisciplinary clinic. Patient information was sent to the multidisciplinary clinic on 09/24/16 by Terie Purser, RN, nurse navigator for the [facility]. The patient will be contacted by someone in the multidisciplinary clinic who will arrange the appointment directly with the patient. The patient was notified of this by Jetta Lout, [place]. She was encouraged to call the [facility] with any further questions or concerns. Addendum by Jetta Lout, RRA on 09/25/16. Electronically Signed   By: Ammie Ferrier M.D.   On: 09/25/2016 12:23   Result Date: 09/26/2016 CLINICAL DATA:  61 year old patient with left breast mass identified on recent screening mammography. Biopsy was recommended of a 7 mm mass in the 2 o'clock position of the left breast. EXAM: ULTRASOUND GUIDED LEFT BREAST CORE NEEDLE BIOPSY COMPARISON:  Previous exam(s). FINDINGS: I met with the patient and we discussed the procedure of ultrasound-guided biopsy, including benefits and alternatives. We discussed the high likelihood of a successful procedure. We discussed the risks of the  procedure, including infection, bleeding, tissue injury, clip migration, and inadequate sampling. Informed written consent was given. The usual time-out protocol was performed immediately prior to the procedure. Lesion quadrant: Upper-outer quadrant Using sterile technique and 1% Lidocaine as local anesthetic, under direct ultrasound visualization, a 12 gauge spring-loaded device was used to perform biopsy of a hypoechoic irregular 7 mm mass using a lateral to medial approach. At the conclusion of the procedure a tissue marker clip was deployed into the biopsy cavity. Follow up 2 view mammogram was performed and dictated separately. IMPRESSION: Ultrasound guided biopsy of the left  breast. No apparent complications. Electronically Signed: By: Curlene Dolphin M.D. 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 The patient would be a good candidate for breast conservation therapy with lumpectomy and sentinel lymph node procedure and adjuvant radiation therapy. The patient will also proceed with Oncotype to determine if she would benefit from adjuvant chemotherapy.  Anticipate the patient to proceed with left lumpectomy and sentinel lymph node biopsy. This will be followed by an oncotype dx. Followed by external beam radiation and an aromatase inhibitor.  It was a pleasure meeting the patient today. We discussed the risks, benefits, and side effects of radiotherapy. I recommend radiotherapy to the left breast to reduce her risk of locoregional recurrence by 2/3.  We discussed that radiation would take approximately 4-6.5 weeks to complete and that I would give the patient a few weeks to heal following surgery before starting treatment planning. We spoke about acute effects including skin irritation and fatigue as well as much less common late effects including internal organ injury or irritation. We spoke about the  latest technology that is used to minimize the risk of late effects for patients undergoing radiotherapy to the breast or chest wall. No guarantees of treatment were given. The patient is enthusiastic about proceeding with treatment. I look forward to participating in the patient's care.  I will await her referral back to me for postoperative follow-up and eventual CT simulation/treatment planning.  Blair Promise, MD   __________________________________________    This document serves as a record of services personally performed by [name], MD. It was created on his behalf by Bethann Humble, a trained medical scribe. The creation of this record is based on the scribe's personal observations and the provider's statements to them. This document has been checked and approved by the attending provider.

## 2016-10-07 ENCOUNTER — Other Ambulatory Visit: Payer: Self-pay | Admitting: General Surgery

## 2016-10-07 ENCOUNTER — Telehealth: Payer: Self-pay | Admitting: *Deleted

## 2016-10-07 DIAGNOSIS — C50412 Malignant neoplasm of upper-outer quadrant of left female breast: Secondary | ICD-10-CM

## 2016-10-07 DIAGNOSIS — Z170 Estrogen receptor positive status [ER+]: Principal | ICD-10-CM

## 2016-10-07 NOTE — Telephone Encounter (Signed)
  Oncology Nurse Navigator Documentation  Navigator Location: [facility] (10/07/16 1500)   )Navigator Encounter Type: Telephone (10/07/16 1500) Telephone: Outgoing Call;Clinic/MDC Follow-up (10/07/16 1500)                                                  Time Spent with Patient: 15 (10/07/16 1500)

## 2016-10-09 ENCOUNTER — Telehealth: Payer: Self-pay | Admitting: Oncology

## 2016-10-09 ENCOUNTER — Encounter (HOSPITAL_COMMUNITY)
Admission: RE | Admit: 2016-10-09 | Discharge: 2016-10-09 | Disposition: A | Discharge status: Home / Self Care | Payer: Medicare Other | Source: Ambulatory Visit | Attending: General Surgery | Admitting: General Surgery

## 2016-10-09 ENCOUNTER — Encounter (HOSPITAL_COMMUNITY): Payer: Self-pay

## 2016-10-09 ENCOUNTER — Ambulatory Visit
Admission: RE | Admit: 2016-10-09 | Discharge: 2016-10-09 | Disposition: A | Discharge status: Home / Self Care | Payer: Medicare Other | Source: Ambulatory Visit | Attending: General Surgery | Admitting: General Surgery

## 2016-10-09 DIAGNOSIS — Z8673 Personal history of transient ischemic attack (TIA), and cerebral infarction without residual deficits: Secondary | ICD-10-CM | POA: Insufficient documentation

## 2016-10-09 DIAGNOSIS — Z7984 Long term (current) use of oral hypoglycemic drugs: Secondary | ICD-10-CM | POA: Diagnosis not present

## 2016-10-09 DIAGNOSIS — Z01818 Encounter for other preprocedural examination: Secondary | ICD-10-CM | POA: Insufficient documentation

## 2016-10-09 DIAGNOSIS — I10 Essential (primary) hypertension: Secondary | ICD-10-CM | POA: Insufficient documentation

## 2016-10-09 DIAGNOSIS — K219 Gastro-esophageal reflux disease without esophagitis: Secondary | ICD-10-CM | POA: Diagnosis not present

## 2016-10-09 DIAGNOSIS — Z170 Estrogen receptor positive status [ER+]: Principal | ICD-10-CM

## 2016-10-09 DIAGNOSIS — E119 Type 2 diabetes mellitus without complications: Secondary | ICD-10-CM | POA: Insufficient documentation

## 2016-10-09 DIAGNOSIS — C50912 Malignant neoplasm of unspecified site of left female breast: Secondary | ICD-10-CM | POA: Diagnosis not present

## 2016-10-09 DIAGNOSIS — C50412 Malignant neoplasm of upper-outer quadrant of left female breast: Secondary | ICD-10-CM

## 2016-10-09 HISTORY — DX: Personal history of other medical treatment: Z92.89

## 2016-10-09 HISTORY — DX: Family history of other specified conditions: Z84.89

## 2016-10-09 HISTORY — DX: Malignant (primary) neoplasm, unspecified: C80.1

## 2016-10-09 HISTORY — DX: Other specified postprocedural states: R11.2

## 2016-10-09 HISTORY — DX: Constipation, unspecified: K59.00

## 2016-10-09 HISTORY — DX: Other intervertebral disc degeneration, lumbar region: M51.36

## 2016-10-09 HISTORY — DX: Depression, unspecified: F32.A

## 2016-10-09 HISTORY — DX: Anxiety disorder, unspecified: F41.9

## 2016-10-09 HISTORY — DX: Pneumonia, unspecified organism: J18.9

## 2016-10-09 HISTORY — DX: Other intervertebral disc degeneration, lumbar region without mention of lumbar back pain or lower extremity pain: M51.369

## 2016-10-09 HISTORY — DX: Headache, unspecified: R51.9

## 2016-10-09 HISTORY — DX: Major depressive disorder, single episode, unspecified: F32.9

## 2016-10-09 HISTORY — DX: Other specified postprocedural states: Z98.890

## 2016-10-09 HISTORY — DX: Headache: R51

## 2016-10-09 LAB — GLUCOSE, CAPILLARY: Glucose-Capillary: 194 mg/dL — ABNORMAL HIGH (ref 65–99)

## 2016-10-09 NOTE — Pre-Procedure Instructions (Addendum)
[name]  10/09/2016     Your procedure is scheduled on Monday, April 30   Report to [facility] Admitting at 5:30 AM               Your surgery or procedure is scheduled for 7:30 AM   Call this number if you have problems the morning of surgery: [phone]    Remember:  Do not eat food or drink liquids after midnight Sunday, April 20.  Except  Drink the bottle of water given yo you at Pre admission appointment.  Take these medicines the morning of surgery with A SIP OF WATER : atenolol (TENORMIN).  May use Flonase if needed.            Make take  montelukast (SINGULAIR) if needed, may use Albuterol if needed and bring it to the hospital with you.                 Do not take any  Aspirin, Aspirin Products (Goody Powder, Excedrin Migraine), Ibuprofen (Advil), Naproxen (Aleve), Vitamins and Herbal Products (ie Fish Oil)   How to Manage Your Diabetes Before and After Surgery  WHAT DO I DO ABOUT MY DIABETES MEDICATION?       Do not take Glimepiride the evening before surgery.       Do not take oral diabetes medicines (pills) the morning of surgery.  Why is it important to control my blood sugar before and after surgery? . Improving blood sugar levels before and after surgery helps healing and can limit problems. . A way of improving blood sugar control is eating a healthy diet by: o  Eating less sugar and carbohydrates o  Increasing activity/exercise o  Talking with your doctor about reaching your blood sugar goals . High blood sugars (greater than 180 mg/dL) can raise your risk of infections and slow your recovery, so you will need to focus on controlling your diabetes during the weeks before surgery. . Make sure that the doctor who takes care of your diabetes knows about your planned surgery including the date and location.  How do I manage my blood sugar before surgery? . Check your blood sugar at least 4 times a day, starting 2 days before surgery, to make  sure that the level is not too high or low. o Check your blood sugar the morning of your surgery when you wake up and every 2 hours until you get to the Short Stay unit. . If your blood sugar is less than 70 mg/dL, you will need to treat for low blood sugar: o Do not take insulin. o Treat a low blood sugar (less than 70 mg/dL) with  cup of clear juice (cranberry or apple), 4 glucose tablets, OR glucose gel. o Recheck blood sugar in 15 minutes after treatment (to make sure it is greater than 70 mg/dL). If your blood sugar is not greater than 70 mg/dL on recheck, call [phone] for further instructions. . Report your blood sugar to the short stay nurse when you get to Short Stay.  . If you are admitted to the hospital after surgery: o Your blood sugar will be checked by the staff and you will probably be given insulin after surgery (instead of oral diabetes medicines) to make sure you have good blood sugar levels. o The goal for blood sugar control after surgery is 80-180 mg/dL.          Patient Signature:  Date:  Nurse Signature:  Date:          Do not wear jewelry, make-up or nail polish.  Do not wear lotions, powders, or perfumes, or deoderant.  Do not shave 48 hours prior to surgery.  Men may shave face and neck.  Do not bring valuables to the hospital.  [facility] is not responsible for any belongings or valuables.  Contacts, dentures or bridgework may not be worn into surgery.  Leave your suitcase in the car.  After surgery it may be brought to your room.  For patients admitted to the hospital, discharge time will be determined by your treatment team.  Patients discharged the day of surgery will not be allowed to drive home.   Special instructions:  -Review  [facility] - Preparing For Surgery.  Please read over the following fact sheets that you were given: [facility]- Preparing For Surgery, Coughing and Deep Breathing, Pain Booklet, Surgical Site  Infections    Patient Signature:  Date:   Nurse Signature:  Date:

## 2016-10-09 NOTE — Progress Notes (Addendum)
error 

## 2016-10-09 NOTE — Telephone Encounter (Signed)
sw pt to confirm 5/22 appt at 1 pm per LOS

## 2016-10-09 NOTE — Progress Notes (Addendum)
[name] denies chest pain or shortness of breath.  Patient reports that her fasting CBG this am was 187. CBG this am was 287- patient reports that she took her medications and has not eaten.   Patient's PCP manages diabetes, last A1C was 7.3 drawn 08/18/16.  I requested last office note and labs to be sent.  [name] is anxious regarding surgery, she asked that she have something for anxiety prior to surgery, "I don't want to see the big light in the OR room."  I mentioned  to patient that she could take Xanax the morning of surgery, she reports that she only takes it at bedtime. Patient asked to talk with someone in anesthesia, Willeen Cass, NP spoke with patient.

## 2016-10-10 NOTE — Progress Notes (Addendum)
Anesthesia consult:   Pt is a 61 year old L scheduled for L breast lumpectomy with radioactive seed and sentinel lymph node biopsy on 10/13/2016 with [name], M.D.  - PCP is Redmond School, MD.  - Saw Jenkins Rouge, MD with cardiology in 2012 for chest pain. Stress test ordered, results below. Pt does not routinely see cardiology.   PMH includes: HTN, DM, stroke, asthma, breast cancer, post-op N/V, GERD. Never smoker. BMI 37.5.  Anesthesia history includes: Pt reports "almost dying" and "seeing the light" after lumbar surgery in 2008 at [facility].  She reports the surgery went fine but there was a problem after she got to the floor.  A review of the available records in Epic from that admission show pt's surgery was complicated by a CSF leak.  Surgery was 08/31/06, she was discharged to rehab 09/07/06, and finally discharged home 09/22/06.  Anesthesia records are not available for review.    Because of this episode, pt is afraid of the OR and states she does not want to be "awake when they take me to the OR".  She reports she had medicine in holding prior to her colonoscopy last fall that worked to achieve this and she would like it again.  Records from [facility] show she received Versed 2mg  and Fentanyl 4mcg prior to her colonoscopy.    Pt is also concerned about post-op N/V.  Apparently she has had significant trouble with this in the past.   Lastly, she has significant back pain and is worried about positioning on the OR table.    Medications include: Albuterol, atenolol, glimepiride, losartan, metformin  Labs from cancer center visit 10/01/16 reviewed and are acceptable for surgery. Glucose 267. HbA1c 7.3 on 08/18/16 at PCP's office.   CXR 08/18/16: Resolution of previously seen left upper lobe pneumonia. No active disease.  EKG 04/11/16: Sinus rhythm with short PR. LAFB. Septal infarct, age undetermined.    Nuclear stress test 01/09/11: Low risk Lexiscan Myoview as outlined.  No diagnostic  ST-segment changes were noted.  There is evidence of breast attenuation, however no clear evidence of scar or ischemia.  LVEF is normal 66%.  If no changes, I anticipate pt can proceed with surgery as scheduled.   Willeen Cass, FNP-BC [facility]/Anesthesiology Phone: [phone] 10/10/2016 12:44 PM

## 2016-10-12 NOTE — Anesthesia Preprocedure Evaluation (Addendum)
Anesthesia Evaluation  Patient identified by MRN, date of birth, ID band Patient awake    Reviewed: Allergy & Precautions, NPO status , Patient's Chart, lab work & pertinent test results  History of Anesthesia Complications (+) PONV  Airway Mallampati: II  TM Distance: >3 FB     Dental   Pulmonary shortness of breath, asthma , pneumonia,    breath sounds clear to auscultation       Cardiovascular hypertension,  Rhythm:Regular Rate:Normal     Neuro/Psych    GI/Hepatic Neg liver ROS, GERD  ,  Endo/Other  diabetes  Renal/GU negative Renal ROS     Musculoskeletal   Abdominal   Peds  Hematology   Anesthesia Other Findings   Reproductive/Obstetrics                            Anesthesia Physical Anesthesia Plan  ASA: III  Anesthesia Plan: General   Post-op Pain Management:    Induction: Intravenous  Airway Management Planned: LMA  Additional Equipment:   Intra-op Plan:   Post-operative Plan: Extubation in OR  Informed Consent: I have reviewed the patients History and Physical, chart, labs and discussed the procedure including the risks, benefits and alternatives for the proposed anesthesia with the patient or authorized representative who has indicated his/her understanding and acceptance.   Dental advisory given  Plan Discussed with:   Anesthesia Plan Comments:        Anesthesia Quick Evaluation

## 2016-10-13 ENCOUNTER — Ambulatory Visit
Admission: RE | Admit: 2016-10-13 | Discharge: 2016-10-13 | Disposition: A | Discharge status: Home / Self Care | Payer: Medicare Other | Source: Ambulatory Visit | Attending: General Surgery | Admitting: General Surgery

## 2016-10-13 ENCOUNTER — Encounter (HOSPITAL_COMMUNITY)
Admission: RE | Disposition: A | Discharge status: Home / Self Care | Payer: Self-pay | Source: Ambulatory Visit | Attending: General Surgery

## 2016-10-13 ENCOUNTER — Observation Stay (HOSPITAL_COMMUNITY)
Admission: RE | Admit: 2016-10-13 | Discharge: 2016-10-14 | Disposition: A | Discharge status: Home / Self Care | Payer: Medicare Other | Source: Ambulatory Visit | Attending: General Surgery | Admitting: General Surgery

## 2016-10-13 ENCOUNTER — Ambulatory Visit (HOSPITAL_COMMUNITY): Payer: Medicare Other | Admitting: Vascular Surgery

## 2016-10-13 ENCOUNTER — Encounter (HOSPITAL_COMMUNITY): Payer: Self-pay | Admitting: *Deleted

## 2016-10-13 ENCOUNTER — Encounter (HOSPITAL_COMMUNITY)
Admission: RE | Admit: 2016-10-13 | Discharge: 2016-10-13 | Disposition: A | Discharge status: Home / Self Care | Payer: Medicare Other | Source: Ambulatory Visit | Attending: General Surgery | Admitting: General Surgery

## 2016-10-13 ENCOUNTER — Ambulatory Visit (HOSPITAL_COMMUNITY): Payer: Medicare Other | Admitting: Anesthesiology

## 2016-10-13 DIAGNOSIS — E669 Obesity, unspecified: Secondary | ICD-10-CM | POA: Insufficient documentation

## 2016-10-13 DIAGNOSIS — Z882 Allergy status to sulfonamides status: Secondary | ICD-10-CM | POA: Insufficient documentation

## 2016-10-13 DIAGNOSIS — N6489 Other specified disorders of breast: Secondary | ICD-10-CM | POA: Diagnosis not present

## 2016-10-13 DIAGNOSIS — Z888 Allergy status to other drugs, medicaments and biological substances status: Secondary | ICD-10-CM | POA: Insufficient documentation

## 2016-10-13 DIAGNOSIS — Z818 Family history of other mental and behavioral disorders: Secondary | ICD-10-CM | POA: Diagnosis not present

## 2016-10-13 DIAGNOSIS — G8929 Other chronic pain: Secondary | ICD-10-CM | POA: Insufficient documentation

## 2016-10-13 DIAGNOSIS — Z91048 Other nonmedicinal substance allergy status: Secondary | ICD-10-CM | POA: Insufficient documentation

## 2016-10-13 DIAGNOSIS — F419 Anxiety disorder, unspecified: Secondary | ICD-10-CM | POA: Diagnosis not present

## 2016-10-13 DIAGNOSIS — M549 Dorsalgia, unspecified: Secondary | ICD-10-CM | POA: Insufficient documentation

## 2016-10-13 DIAGNOSIS — R1310 Dysphagia, unspecified: Secondary | ICD-10-CM | POA: Diagnosis not present

## 2016-10-13 DIAGNOSIS — C50912 Malignant neoplasm of unspecified site of left female breast: Secondary | ICD-10-CM | POA: Diagnosis not present

## 2016-10-13 DIAGNOSIS — Z833 Family history of diabetes mellitus: Secondary | ICD-10-CM | POA: Diagnosis not present

## 2016-10-13 DIAGNOSIS — Z79899 Other long term (current) drug therapy: Secondary | ICD-10-CM | POA: Diagnosis not present

## 2016-10-13 DIAGNOSIS — Z90710 Acquired absence of both cervix and uterus: Secondary | ICD-10-CM | POA: Diagnosis not present

## 2016-10-13 DIAGNOSIS — Z170 Estrogen receptor positive status [ER+]: Secondary | ICD-10-CM | POA: Diagnosis not present

## 2016-10-13 DIAGNOSIS — Z7984 Long term (current) use of oral hypoglycemic drugs: Secondary | ICD-10-CM | POA: Insufficient documentation

## 2016-10-13 DIAGNOSIS — E119 Type 2 diabetes mellitus without complications: Secondary | ICD-10-CM | POA: Diagnosis not present

## 2016-10-13 DIAGNOSIS — Z800 Family history of malignant neoplasm of digestive organs: Secondary | ICD-10-CM | POA: Insufficient documentation

## 2016-10-13 DIAGNOSIS — Z811 Family history of alcohol abuse and dependence: Secondary | ICD-10-CM | POA: Diagnosis not present

## 2016-10-13 DIAGNOSIS — Z880 Allergy status to penicillin: Secondary | ICD-10-CM | POA: Diagnosis not present

## 2016-10-13 DIAGNOSIS — C50412 Malignant neoplasm of upper-outer quadrant of left female breast: Secondary | ICD-10-CM

## 2016-10-13 DIAGNOSIS — Z8261 Family history of arthritis: Secondary | ICD-10-CM | POA: Insufficient documentation

## 2016-10-13 DIAGNOSIS — I10 Essential (primary) hypertension: Secondary | ICD-10-CM | POA: Diagnosis not present

## 2016-10-13 DIAGNOSIS — Z820 Family history of epilepsy and other diseases of the nervous system: Secondary | ICD-10-CM | POA: Diagnosis not present

## 2016-10-13 DIAGNOSIS — N6012 Diffuse cystic mastopathy of left breast: Secondary | ICD-10-CM | POA: Diagnosis not present

## 2016-10-13 DIAGNOSIS — Z808 Family history of malignant neoplasm of other organs or systems: Secondary | ICD-10-CM | POA: Insufficient documentation

## 2016-10-13 DIAGNOSIS — Z8249 Family history of ischemic heart disease and other diseases of the circulatory system: Secondary | ICD-10-CM | POA: Diagnosis not present

## 2016-10-13 DIAGNOSIS — G8918 Other acute postprocedural pain: Secondary | ICD-10-CM | POA: Diagnosis not present

## 2016-10-13 HISTORY — PX: BREAST LUMPECTOMY: SHX2

## 2016-10-13 HISTORY — PX: BREAST LUMPECTOMY WITH RADIOACTIVE SEED AND SENTINEL LYMPH NODE BIOPSY: SHX6550

## 2016-10-13 LAB — GLUCOSE, CAPILLARY
GLUCOSE-CAPILLARY: 227 mg/dL — AB (ref 65–99)
GLUCOSE-CAPILLARY: 219 mg/dL — AB (ref 65–99)
Glucose-Capillary: 163 mg/dL — ABNORMAL HIGH (ref 65–99)
Glucose-Capillary: 222 mg/dL — ABNORMAL HIGH (ref 65–99)
Glucose-Capillary: 263 mg/dL — ABNORMAL HIGH (ref 65–99)

## 2016-10-13 SURGERY — BREAST LUMPECTOMY WITH RADIOACTIVE SEED AND SENTINEL LYMPH NODE BIOPSY
Anesthesia: Regional | Site: Breast | Laterality: Left

## 2016-10-13 MED ORDER — MIDAZOLAM HCL 5 MG/5ML IJ SOLN
INTRAMUSCULAR | Status: DC | PRN
  Administered 2016-10-13 (×2): 1 mg via INTRAVENOUS

## 2016-10-13 MED ORDER — BUPIVACAINE HCL (PF) 0.25 % IJ SOLN
INTRAMUSCULAR | Status: DC | PRN
  Administered 2016-10-13: 4 mL

## 2016-10-13 MED ORDER — FENTANYL CITRATE (PF) 100 MCG/2ML IJ SOLN
INTRAMUSCULAR | Status: AC
  Filled 2016-10-13: qty 2

## 2016-10-13 MED ORDER — ENOXAPARIN SODIUM 40 MG/0.4ML ~~LOC~~ SOLN
40.0000 mg | SUBCUTANEOUS | Status: DC
  Administered 2016-10-14: 40 mg via SUBCUTANEOUS
  Filled 2016-10-13: qty 0.4

## 2016-10-13 MED ORDER — PROMETHAZINE HCL 25 MG/ML IJ SOLN
12.5000 mg | Freq: Four times a day (QID) | INTRAMUSCULAR | Status: DC | PRN
  Administered 2016-10-13: 12.5 mg via INTRAVENOUS
  Filled 2016-10-13: qty 1

## 2016-10-13 MED ORDER — PHENOL 1.4 % MT LIQD
1.0000 | OROMUCOSAL | Status: DC | PRN
  Administered 2016-10-13: 1 via OROMUCOSAL

## 2016-10-13 MED ORDER — CIPROFLOXACIN IN D5W 400 MG/200ML IV SOLN
400.0000 mg | INTRAVENOUS | Status: AC
  Administered 2016-10-13: 400 mg via INTRAVENOUS
  Filled 2016-10-13: qty 200

## 2016-10-13 MED ORDER — GABAPENTIN 300 MG PO CAPS
300.0000 mg | ORAL_CAPSULE | ORAL | Status: AC
  Administered 2016-10-13: 300 mg via ORAL
  Filled 2016-10-13: qty 1

## 2016-10-13 MED ORDER — ACETAMINOPHEN 325 MG PO TABS
650.0000 mg | ORAL_TABLET | Freq: Four times a day (QID) | ORAL | Status: DC | PRN
  Administered 2016-10-14: 650 mg via ORAL

## 2016-10-13 MED ORDER — ONDANSETRON HCL 4 MG/2ML IJ SOLN
4.0000 mg | Freq: Four times a day (QID) | INTRAMUSCULAR | Status: DC | PRN
  Administered 2016-10-13: 4 mg via INTRAVENOUS
  Filled 2016-10-13: qty 2

## 2016-10-13 MED ORDER — LIDOCAINE 2% (20 MG/ML) 5 ML SYRINGE
INTRAMUSCULAR | Status: AC
  Filled 2016-10-13: qty 5

## 2016-10-13 MED ORDER — LOSARTAN POTASSIUM 50 MG PO TABS
25.0000 mg | ORAL_TABLET | Freq: Every day | ORAL | Status: DC
  Administered 2016-10-14: 25 mg via ORAL
  Filled 2016-10-13: qty 1

## 2016-10-13 MED ORDER — PHENYLEPHRINE HCL 10 MG/ML IJ SOLN
INTRAMUSCULAR | Status: DC | PRN
  Administered 2016-10-13 (×3): 80 ug via INTRAVENOUS

## 2016-10-13 MED ORDER — TECHNETIUM TC 99M SULFUR COLLOID FILTERED
1.0000 | Freq: Once | INTRAVENOUS | Status: AC | PRN
  Administered 2016-10-13: 1 via INTRADERMAL

## 2016-10-13 MED ORDER — FENTANYL CITRATE (PF) 100 MCG/2ML IJ SOLN
INTRAMUSCULAR | Status: DC | PRN
  Administered 2016-10-13 (×2): 50 ug via INTRAVENOUS

## 2016-10-13 MED ORDER — LIDOCAINE HCL (CARDIAC) 20 MG/ML IV SOLN
INTRAVENOUS | Status: DC | PRN
  Administered 2016-10-13: 80 mg via INTRAVENOUS

## 2016-10-13 MED ORDER — PHENYLEPHRINE 40 MCG/ML (10ML) SYRINGE FOR IV PUSH (FOR BLOOD PRESSURE SUPPORT)
PREFILLED_SYRINGE | INTRAVENOUS | Status: AC
  Filled 2016-10-13: qty 10

## 2016-10-13 MED ORDER — ALPRAZOLAM 0.5 MG PO TABS
1.0000 mg | ORAL_TABLET | Freq: Two times a day (BID) | ORAL | Status: DC | PRN
  Administered 2016-10-13: 1 mg via ORAL
  Filled 2016-10-13: qty 2

## 2016-10-13 MED ORDER — ALBUTEROL SULFATE (2.5 MG/3ML) 0.083% IN NEBU: 3.0000 mL | INHALATION_SOLUTION | Freq: Four times a day (QID) | RESPIRATORY_TRACT | Status: DC | PRN

## 2016-10-13 MED ORDER — MIDAZOLAM HCL 2 MG/2ML IJ SOLN
INTRAMUSCULAR | Status: AC
  Filled 2016-10-13: qty 2

## 2016-10-13 MED ORDER — GLIMEPIRIDE 4 MG PO TABS
4.0000 mg | ORAL_TABLET | Freq: Two times a day (BID) | ORAL | Status: DC
  Administered 2016-10-13 – 2016-10-14 (×2): 4 mg via ORAL
  Filled 2016-10-13 (×3): qty 1

## 2016-10-13 MED ORDER — SODIUM CHLORIDE 0.9 % IJ SOLN
INTRAMUSCULAR | Status: AC
  Filled 2016-10-13: qty 10

## 2016-10-13 MED ORDER — BUPIVACAINE HCL (PF) 0.25 % IJ SOLN
INTRAMUSCULAR | Status: AC
  Filled 2016-10-13: qty 30

## 2016-10-13 MED ORDER — MONTELUKAST SODIUM 10 MG PO TABS
10.0000 mg | ORAL_TABLET | Freq: Every day | ORAL | Status: DC
  Administered 2016-10-13: 10 mg via ORAL
  Filled 2016-10-13: qty 1

## 2016-10-13 MED ORDER — ATENOLOL 25 MG PO TABS
25.0000 mg | ORAL_TABLET | Freq: Every day | ORAL | Status: DC
Start: 2016-10-14 — End: 2016-10-14
  Administered 2016-10-14: 25 mg via ORAL
  Filled 2016-10-13: qty 1

## 2016-10-13 MED ORDER — FLUTICASONE PROPIONATE 50 MCG/ACT NA SUSP
1.0000 | Freq: Every day | NASAL | Status: DC | PRN
  Filled 2016-10-13: qty 16

## 2016-10-13 MED ORDER — 0.9 % SODIUM CHLORIDE (POUR BTL) OPTIME
TOPICAL | Status: DC | PRN
  Administered 2016-10-13: 1000 mL

## 2016-10-13 MED ORDER — PROPOFOL 10 MG/ML IV BOLUS
INTRAVENOUS | Status: AC
  Filled 2016-10-13: qty 40

## 2016-10-13 MED ORDER — FENTANYL CITRATE (PF) 250 MCG/5ML IJ SOLN
INTRAMUSCULAR | Status: AC
  Filled 2016-10-13: qty 5

## 2016-10-13 MED ORDER — FENTANYL CITRATE (PF) 100 MCG/2ML IJ SOLN: 25.0000 ug | INTRAMUSCULAR | Status: DC | PRN

## 2016-10-13 MED ORDER — SIMETHICONE 80 MG PO CHEW: 40.0000 mg | CHEWABLE_TABLET | Freq: Four times a day (QID) | ORAL | Status: DC | PRN

## 2016-10-13 MED ORDER — BUPIVACAINE-EPINEPHRINE (PF) 0.5% -1:200000 IJ SOLN
INTRAMUSCULAR | Status: DC | PRN
  Administered 2016-10-13: 30 mL via PERINEURAL

## 2016-10-13 MED ORDER — ACETAMINOPHEN 500 MG PO TABS
1000.0000 mg | ORAL_TABLET | ORAL | Status: AC
  Administered 2016-10-13: 1000 mg via ORAL
  Filled 2016-10-13: qty 2

## 2016-10-13 MED ORDER — ONDANSETRON HCL 4 MG/2ML IJ SOLN
INTRAMUSCULAR | Status: AC
  Filled 2016-10-13: qty 2

## 2016-10-13 MED ORDER — METHOCARBAMOL 500 MG PO TABS
500.0000 mg | ORAL_TABLET | Freq: Four times a day (QID) | ORAL | Status: DC | PRN
  Administered 2016-10-13 – 2016-10-14 (×3): 500 mg via ORAL
  Filled 2016-10-13 (×3): qty 1

## 2016-10-13 MED ORDER — ONDANSETRON HCL 4 MG/2ML IJ SOLN
INTRAMUSCULAR | Status: DC | PRN
  Administered 2016-10-13: 4 mg via INTRAVENOUS

## 2016-10-13 MED ORDER — FENTANYL CITRATE (PF) 100 MCG/2ML IJ SOLN
25.0000 ug | INTRAMUSCULAR | Status: DC | PRN
  Administered 2016-10-13 (×3): 50 ug via INTRAVENOUS

## 2016-10-13 MED ORDER — EPHEDRINE 5 MG/ML INJ
INTRAVENOUS | Status: AC
  Filled 2016-10-13: qty 20

## 2016-10-13 MED ORDER — ACETAMINOPHEN 650 MG RE SUPP: 650.0000 mg | Freq: Four times a day (QID) | RECTAL | Status: DC | PRN

## 2016-10-13 MED ORDER — HYDROMORPHONE HCL 1 MG/ML IJ SOLN
0.5000 mg | INTRAMUSCULAR | Status: DC | PRN
  Administered 2016-10-13: 0.5 mg via INTRAVENOUS
  Filled 2016-10-13: qty 1

## 2016-10-13 MED ORDER — BISACODYL 5 MG PO TBEC: 10.0000 mg | DELAYED_RELEASE_TABLET | Freq: Every day | ORAL | Status: DC | PRN

## 2016-10-13 MED ORDER — OXYCODONE HCL 5 MG PO TABS
10.0000 mg | ORAL_TABLET | Freq: Four times a day (QID) | ORAL | Status: DC | PRN
  Administered 2016-10-13 – 2016-10-14 (×3): 10 mg via ORAL
  Filled 2016-10-13 (×3): qty 2

## 2016-10-13 MED ORDER — INSULIN ASPART 100 UNIT/ML ~~LOC~~ SOLN
0.0000 [IU] | Freq: Three times a day (TID) | SUBCUTANEOUS | Status: DC
  Administered 2016-10-13: 5 [IU] via SUBCUTANEOUS
  Administered 2016-10-13: 8 [IU] via SUBCUTANEOUS
  Administered 2016-10-14: 3 [IU] via SUBCUTANEOUS

## 2016-10-13 MED ORDER — ONDANSETRON 4 MG PO TBDP: 4.0000 mg | ORAL_TABLET | Freq: Four times a day (QID) | ORAL | Status: DC | PRN

## 2016-10-13 MED ORDER — PROPOFOL 10 MG/ML IV BOLUS
INTRAVENOUS | Status: DC | PRN
Start: 2016-10-13 — End: 2016-10-13
  Administered 2016-10-13: 70 mg via INTRAVENOUS
  Administered 2016-10-13: 200 mg via INTRAVENOUS

## 2016-10-13 MED ORDER — LACTATED RINGERS IV SOLN
INTRAVENOUS | Status: DC | PRN
  Administered 2016-10-13 (×2): via INTRAVENOUS

## 2016-10-13 MED ORDER — METHYLENE BLUE 0.5 % INJ SOLN
INTRAVENOUS | Status: AC
  Filled 2016-10-13: qty 10

## 2016-10-13 SURGICAL SUPPLY — 53 items
APPLIER CLIP 9.375 MED OPEN (MISCELLANEOUS) ×3
APR CLP MED 9.3 20 MLT OPN (MISCELLANEOUS) ×1
BINDER BREAST LRG (GAUZE/BANDAGES/DRESSINGS) IMPLANT
BINDER BREAST XLRG (GAUZE/BANDAGES/DRESSINGS) ×3 IMPLANT
BLADE SURG 15 STRL LF DISP TIS (BLADE) ×1 IMPLANT
BLADE SURG 15 STRL SS (BLADE) ×2
CANISTER SUCT 3000ML PPV (MISCELLANEOUS) ×3 IMPLANT
CHLORAPREP W/TINT 26ML (MISCELLANEOUS) ×3 IMPLANT
CLIP APPLIE 9.375 MED OPEN (MISCELLANEOUS) ×1 IMPLANT
CLOSURE WOUND 1/2 X4 (GAUZE/BANDAGES/DRESSINGS) ×1
COVER PROBE W GEL 5X96 (DRAPES) ×3 IMPLANT
COVER SURGICAL LIGHT HANDLE (MISCELLANEOUS) ×3 IMPLANT
DERMABOND ADVANCED (GAUZE/BANDAGES/DRESSINGS) ×2
DERMABOND ADVANCED .7 DNX12 (GAUZE/BANDAGES/DRESSINGS) ×1 IMPLANT
DEVICE DUBIN SPECIMEN MAMMOGRA (MISCELLANEOUS) ×3 IMPLANT
DRAPE CHEST BREAST 15X10 FENES (DRAPES) ×3 IMPLANT
DRAPE UTILITY XL STRL (DRAPES) ×3 IMPLANT
ELECT COATED BLADE 2.86 ST (ELECTRODE) ×3 IMPLANT
ELECT REM PT RETURN 9FT ADLT (ELECTROSURGICAL) ×3
ELECTRODE REM PT RTRN 9FT ADLT (ELECTROSURGICAL) ×1 IMPLANT
GLOVE BIO SURGEON STRL SZ7 (GLOVE) ×3 IMPLANT
GLOVE BIO SURGEON STRL SZ7.5 (GLOVE) ×3 IMPLANT
GLOVE BIOGEL PI IND STRL 7.5 (GLOVE) ×2 IMPLANT
GLOVE BIOGEL PI INDICATOR 7.5 (GLOVE) ×4
GOWN STRL REUS W/ TWL LRG LVL3 (GOWN DISPOSABLE) ×2 IMPLANT
GOWN STRL REUS W/TWL LRG LVL3 (GOWN DISPOSABLE) ×6
ILLUMINATOR WAVEGUIDE N/F (MISCELLANEOUS) ×3 IMPLANT
KIT BASIN OR (CUSTOM PROCEDURE TRAY) ×3 IMPLANT
KIT MARKER MARGIN INK (KITS) ×3 IMPLANT
MARKER SKIN DUAL TIP RULER LAB (MISCELLANEOUS) ×3 IMPLANT
NDL SAFETY ECLIPSE 18X1.5 (NEEDLE) IMPLANT
NEEDLE FILTER BLUNT 18X 1/2SAF (NEEDLE)
NEEDLE FILTER BLUNT 18X1 1/2 (NEEDLE) IMPLANT
NEEDLE HYPO 18GX1.5 SHARP (NEEDLE)
NEEDLE HYPO 25X1 1.5 SAFETY (NEEDLE) ×3 IMPLANT
NS IRRIG 1000ML POUR BTL (IV SOLUTION) ×3 IMPLANT
PACK SURGICAL SETUP 50X90 (CUSTOM PROCEDURE TRAY) ×3 IMPLANT
PENCIL BUTTON HOLSTER BLD 10FT (ELECTRODE) ×3 IMPLANT
SPONGE LAP 18X18 X RAY DECT (DISPOSABLE) ×3 IMPLANT
STRIP CLOSURE SKIN 1/2X4 (GAUZE/BANDAGES/DRESSINGS) ×2 IMPLANT
SUT MNCRL AB 4-0 PS2 18 (SUTURE) ×6 IMPLANT
SUT SILK 2 0 SH (SUTURE) ×3 IMPLANT
SUT VIC AB 2-0 SH 27 (SUTURE) ×9
SUT VIC AB 2-0 SH 27XBRD (SUTURE) ×3 IMPLANT
SUT VIC AB 3-0 SH 27 (SUTURE) ×4
SUT VIC AB 3-0 SH 27X BRD (SUTURE) ×2 IMPLANT
SYR BULB 3OZ (MISCELLANEOUS) ×3 IMPLANT
SYR CONTROL 10ML LL (SYRINGE) ×3 IMPLANT
TOWEL OR 17X24 6PK STRL BLUE (TOWEL DISPOSABLE) ×3 IMPLANT
TOWEL OR 17X26 10 PK STRL BLUE (TOWEL DISPOSABLE) ×3 IMPLANT
TUBE CONNECTING 12'X1/4 (SUCTIONS) ×1
TUBE CONNECTING 12X1/4 (SUCTIONS) ×2 IMPLANT
YANKAUER SUCT BULB TIP NO VENT (SUCTIONS) ×3 IMPLANT

## 2016-10-13 NOTE — Anesthesia Procedure Notes (Signed)
Anesthesia Regional Block: Pectoralis block   Pre-Anesthetic Checklist: ,, timeout performed, Correct Patient, Correct Site, Correct Laterality, Correct Procedure, Correct Position, site marked, Risks and benefits discussed,  Surgical consent,  Pre-op evaluation,  At surgeon's request and post-op pain management  Laterality: Left  Prep: chloraprep       Needles:  Injection technique: Single-shot  Needle Type: Echogenic Stimulator Needle          Additional Needles:   Procedures: ultrasound guided,,,,,,,,  Narrative:  Start time: 10/13/2016 7:21 AM End time: 10/13/2016 7:27 AM Injection made incrementally with aspirations every 5 mL.  Performed by: Personally  Anesthesiologist: Ronnica Dreese  Additional Notes: H+P and labs reviewed, risks and benefits discussed with patient, procedure tolerated well without complications

## 2016-10-13 NOTE — Interval H&P Note (Signed)
History and Physical Interval Note:  10/13/2016 7:15 AM  [name]  has presented today for surgery, with the diagnosis of LEFT BREAST CANCER  The various methods of treatment have been discussed with the patient and family. After consideration of risks, benefits and other options for treatment, the patient has consented to  Procedure(s): BREAST LUMPECTOMY WITH RADIOACTIVE SEED AND SENTINEL LYMPH NODE BIOPSY (Left) as a surgical intervention .  The patient's history has been reviewed, patient examined, no change in status, stable for surgery.  I have reviewed the patient's chart and labs.  Questions were answered to the patient's satisfaction.     Khadir Roam

## 2016-10-13 NOTE — Progress Notes (Signed)
Pt received to 6n23 from PACU, A/Ox4, denies nausea/pain at this time, oriented to room and surroundings, family at bedside

## 2016-10-13 NOTE — Anesthesia Procedure Notes (Signed)
Procedure Name: LMA Insertion Date/Time: 10/13/2016 7:43 AM Performed by: Scheryl Darter Pre-anesthesia Checklist: Patient identified, Emergency Drugs available, Suction available and Patient being monitored Patient Re-evaluated:Patient Re-evaluated prior to inductionOxygen Delivery Method: Circle System Utilized Preoxygenation: Pre-oxygenation with 100% oxygen Intubation Type: IV induction Ventilation: Mask ventilation without difficulty LMA: LMA inserted LMA Size: 4.0 Number of attempts: 1 Placement Confirmation: positive ETCO2 Tube secured with: Tape Dental Injury: Teeth and Oropharynx as per pre-operative assessment

## 2016-10-13 NOTE — Transfer of Care (Signed)
Immediate Anesthesia Transfer of Care Note  Patient: [name]  Procedure(s) Performed: Procedure(s): BREAST LUMPECTOMY WITH RADIOACTIVE SEED AND SENTINEL LYMPH NODE BIOPSY (Left)  Patient Location: PACU  Anesthesia Type:General  Level of Consciousness: awake, alert , oriented and sedated  Airway & Oxygen Therapy: Patient Spontanous Breathing and Patient connected to nasal cannula oxygen  Post-op Assessment: Report given to RN, Post -op Vital signs reviewed and stable and Patient moving all extremities  Post vital signs: Reviewed and stable  Last Vitals:  Vitals:   10/13/16 0606 10/13/16 0929  BP: (!) 132/58 110/60  Pulse: 69 79  Resp: 18 18  Temp: 36.6 C     Last Pain:  Vitals:   10/13/16 0606  TempSrc: Oral      Patients Stated Pain Goal: 5 (35/45/62 5638)  Complications: No apparent anesthesia complications

## 2016-10-13 NOTE — Anesthesia Postprocedure Evaluation (Signed)
Anesthesia Post Note  Patient: [name]  Procedure(s) Performed: Procedure(s) (LRB): BREAST LUMPECTOMY WITH RADIOACTIVE SEED AND SENTINEL LYMPH NODE BIOPSY (Left)  Patient location during evaluation: PACU Anesthesia Type: Regional Level of consciousness: awake Pain management: pain level controlled Respiratory status: spontaneous breathing Cardiovascular status: stable Anesthetic complications: no       Last Vitals:  Vitals:   10/13/16 0959 10/13/16 1015  BP: (!) 106/45 100/60  Pulse: 73 67  Resp: 11 18  Temp:      Last Pain:  Vitals:   10/13/16 1010  TempSrc:   PainSc: 3                  Talibah Colasurdo

## 2016-10-13 NOTE — H&P (Signed)
3 yof referred by Dr [name] for newly diagnosed left breast cancer. she has no personal history of breast disease. she has no fh of breast or ovarian cancer. she had no mass or dc. this was screening mm. Her density is b. she had a left breast mass noted on mm with distortion. on [place] this is 9x7x6 mm in size. the axilla is negative by [place]. 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 she is here with her family to discuss options. she had pna in dec/jan that is now resolved. she lives in [place]. she is disabled due to back issues and is difficult to move around.   \ Past Surgical History Laurie Pummel, RN; 10/01/2016 7:50 AM) Breast Biopsy  Left. Foot Surgery  Right. Gallbladder Surgery - Open  Hysterectomy (not due to cancer) - Partial   Diagnostic Studies History Laurie Pummel, RN; 10/01/2016 7:50 AM) Colonoscopy  within last year Mammogram  within last year Pap Smear  >5 years ago  Medication History Laurie Pummel, RN; 10/01/2016 7:51 AM) Medications Reconciled  Social History Laurie Pummel, RN; 10/01/2016 7:50 AM) Caffeine use  Carbonated beverages, Coffee, Tea. No alcohol use  No drug use  Tobacco use  Never smoker.  Family History Laurie Pummel, RN; 10/01/2016 7:50 AM) Alcohol Abuse  Father. Arthritis  Father, Mother. Depression  Sister. Diabetes Mellitus  Father, Mother. Heart Disease  Brother, Father, Mother, Sister. Heart disease in female family member before age 50  Heart disease in female family member before age 15  Hypertension  Brother, Father, Mother, Sister. Melanoma  Son. Rectal Cancer  Family Members In General. Respiratory Condition  Father. Seizure disorder  Family Members In General. Thyroid problems  Mother.  Pregnancy / Birth History Laurie Pummel, RN; 10/01/2016 7:50 AM) Age at menarche  33 years. Age of menopause  <47 Contraceptive History  Oral  contraceptives. Gravida  2 Maternal age  8-20 Para  2  Other Problems Laurie Pummel, RN; 10/01/2016 7:50 AM) Anxiety Disorder  Back Pain  Cholelithiasis  Diabetes Mellitus  General anesthesia - complications  High blood pressure  Lump In Breast  Transfusion history     Review of Systems Laurie Spillers Ledford RN; 10/01/2016 7:50 AM) General Present- Fatigue and Night Sweats. Not Present- Appetite Loss, Chills, Fever, Weight Gain and Weight Loss. Skin Present- Dryness. Not Present- Change in Wart/Mole, Hives, Jaundice, New Lesions, Non-Healing Wounds, Rash and Ulcer. HEENT Present- Hoarseness, Sinus Pain and Wears glasses/contact lenses. Not Present- Earache, Hearing Loss, Nose Bleed, Oral Ulcers, Ringing in the Ears, Seasonal Allergies, Sore Throat, Visual Disturbances and Yellow Eyes. Respiratory Present- Difficulty Breathing. Not Present- Bloody sputum, Chronic Cough, Snoring and Wheezing. Breast Present- Breast Mass. Not Present- Breast Pain, Nipple Discharge and Skin Changes. Cardiovascular Present- Leg Cramps. Not Present- Chest Pain, Difficulty Breathing Lying Down, Palpitations, Rapid Heart Rate, Shortness of Breath and Swelling of Extremities. Gastrointestinal Not Present- Abdominal Pain, Bloating, Bloody Stool, Change in Bowel Habits, Chronic diarrhea, Constipation, Difficulty Swallowing, Excessive gas, Gets full quickly at meals, Hemorrhoids, Indigestion, Nausea, Rectal Pain and Vomiting. Female Genitourinary Not Present- Frequency, Nocturia, Painful Urination, Pelvic Pain and Urgency. Musculoskeletal Present- Back Pain and Muscle Weakness. Not Present- Joint Pain, Joint Stiffness, Muscle Pain and Swelling of Extremities. Neurological Present- Trouble walking and Weakness. Not Present- Decreased Memory, Fainting, Headaches, Numbness, Seizures, Tingling and Tremor. Psychiatric Present- Anxiety. Not Present- Bipolar, Change in Sleep Pattern, Depression, Fearful and Frequent  crying. Hematology Present- Easy Bruising. Not Present- Blood Thinners, Excessive bleeding, Gland problems, HIV and Persistent Infections.   Physical Exam Laurie Bookbinder MD; 10/01/2016 3:10 PM) General Mental Status-Alert. Orientation-Oriented X3.  Head and Neck Thyroid -Note: no thyroid mass or thyromegaly.   Eye Sclera/Conjunctiva - Bilateral-No scleral icterus.  Chest and Lung Exam Chest and lung exam reveals -quiet, even and easy respiratory effort with no use of accessory muscles and on auscultation, normal breath sounds, no adventitious sounds and normal vocal resonance.  Breast Nipples-No Discharge. Breast Lump-No Palpable Breast Mass.  Cardiovascular Cardiovascular examination reveals -normal heart sounds, regular rate and rhythm with no murmurs.  Abdomen Note: soft nt/nd   Peripheral Vascular Note: mild pedal edema   Neurologic Mental Status-Normal. Motor-Normal.  Lymphatic Head & Neck  General Head & Neck Lymphatics: Bilateral - Description - Normal. Axillary  General Axillary Region: Bilateral - Description - Normal. Note: no [place] adenopathy     Assessment & Plan Laurie Bookbinder MD; 10/01/2016 3:12 PM) BREAST CANCER OF UPPER-OUTER QUADRANT OF LEFT FEMALE BREAST (C50.412) Story: left breast lumpectomy, left axillary sn biopsy We discussed the staging and pathophysiology of breast cancer. We discussed all of the different options for treatment We discussed a sentinel lymph node biopsy as she does not appear to having lymph node involvement right now. We discussed the performance of that with injection of radioactive tracer. We discussed that she would have an incision underneath her axillary hairline or would be done through the same incision. We discussed that there is a chance of having a positive node with a sentinel lymph node biopsy and we will await the permanent pathology to make any other first further decisions in  terms of her treatment. We discussed up to a 5% risk lifetime of chronic shoulder pain as well as lymphedema associated with a sentinel lymph node biopsy. We discussed the options for treatment of the breast cancer which included lumpectomy versus a mastectomy. We discussed the performance of the lumpectomy with radioactive seed placement. We discussed a 5-10% chance of a positive margin requiring reexcision in the operating room. We also discussed that she will need radiation therapy if she undergoes lumpectomy. The breast cannot undergo more radiation therapy in the same breast after lumpectomy in the future. We discussed the mastectomy (removal of whole breast) and the postoperative care for that as well. Mastectomy can be followed by reconstruction. This is a more extensive surgery and requires more recovery. The decision for lumpectomy vs mastectomy has no impact on decision for chemotherapy. Most mastectomy patients will not need radiation therapy. We discussed that there is no difference in her survival whether she undergoes lumpectomy with radiation therapy or antiestrogen therapy versus a mastectomy.There is also no real difference between her recurrence in the breast. We discussed the risks of operation including bleeding, infection, more surgery

## 2016-10-13 NOTE — Op Note (Signed)
1:53 PM      [] Hide copied text Preoperative diagnosis: Leftbreast cancer, clinical stage I Postoperative diagnosis: same as above Procedure: 1. Left breast seed guided lumpectomy 2. Leftdeep axillary sentinel node biopsy Surgeon Dr Serita Grammes Anes general  EBL: minimal Comps none Specimen:  1. leftbreast marked with paint containing clip, seed separate 2. Left axillary sentinel nodes with highest count 971 3. Additional anterior and inferior margins marked short superior, long lateral double deep Sponge count correct at completion Dispoto recovery stable  Indications: This is a 89yof with stage I left breast cancer. We have decided to proceed with lumpectomy/sn biopsy.She had seed placed prior to beginning. I had these mm in the OR.   Procedure: After informed consent was obtained the patient was taken to the OR. She was injected with technetium in the standard periareolar fashion.She was given anitibiotics. SCDs were in place. She was prepped and draped in the standard sterile surgical fashion. A timeout was performed. She had undergone a pectoral block.  I then located the seed in the left upper outer breast. I then infiltrated marcaine around the seed and tumor.  I made a curvilinear incision in the low axilla in order to hide the scar. I did the sn and lumpectomy via the same incision. I then removed the seedwith an attempt to get a clear margin. This waspassed off the table after marking with paint.I did confirm removal of theclipand seed with mammography.Pathology thought grossly I might be close to the anterior and inferior margins so I removed more of both of these I obtained hemostasis. I placed clips in this cavity. I then opened the axillary fascia. I identified the sentinel nodes with the highest count as above. I removed a number of nodes as there was diffuse radioactivity in the axilla. There was no gross nodal disease.There was no background  radioactivity. I obtained hemostasis. I closed the axillary fascia and breast tissue with 2-0 vicryl to close down the space. I then closed the dermis with 3-0 vicryl and the skin with 4-0 monocryl.  Glue and steristrips were placed A breast binder was placed. She was extubated and transferred to recovery stable

## 2016-10-14 ENCOUNTER — Encounter (HOSPITAL_COMMUNITY): Payer: Self-pay | Admitting: General Surgery

## 2016-10-14 DIAGNOSIS — M549 Dorsalgia, unspecified: Secondary | ICD-10-CM | POA: Diagnosis not present

## 2016-10-14 DIAGNOSIS — Z8261 Family history of arthritis: Secondary | ICD-10-CM | POA: Diagnosis not present

## 2016-10-14 DIAGNOSIS — Z820 Family history of epilepsy and other diseases of the nervous system: Secondary | ICD-10-CM | POA: Diagnosis not present

## 2016-10-14 DIAGNOSIS — E119 Type 2 diabetes mellitus without complications: Secondary | ICD-10-CM | POA: Diagnosis not present

## 2016-10-14 DIAGNOSIS — Z79899 Other long term (current) drug therapy: Secondary | ICD-10-CM | POA: Diagnosis not present

## 2016-10-14 DIAGNOSIS — Z882 Allergy status to sulfonamides status: Secondary | ICD-10-CM | POA: Diagnosis not present

## 2016-10-14 DIAGNOSIS — Z7984 Long term (current) use of oral hypoglycemic drugs: Secondary | ICD-10-CM | POA: Diagnosis not present

## 2016-10-14 DIAGNOSIS — Z808 Family history of malignant neoplasm of other organs or systems: Secondary | ICD-10-CM | POA: Diagnosis not present

## 2016-10-14 DIAGNOSIS — G8929 Other chronic pain: Secondary | ICD-10-CM | POA: Diagnosis not present

## 2016-10-14 DIAGNOSIS — Z800 Family history of malignant neoplasm of digestive organs: Secondary | ICD-10-CM | POA: Diagnosis not present

## 2016-10-14 DIAGNOSIS — Z888 Allergy status to other drugs, medicaments and biological substances status: Secondary | ICD-10-CM | POA: Diagnosis not present

## 2016-10-14 DIAGNOSIS — Z8249 Family history of ischemic heart disease and other diseases of the circulatory system: Secondary | ICD-10-CM | POA: Diagnosis not present

## 2016-10-14 DIAGNOSIS — I10 Essential (primary) hypertension: Secondary | ICD-10-CM | POA: Diagnosis not present

## 2016-10-14 DIAGNOSIS — Z91048 Other nonmedicinal substance allergy status: Secondary | ICD-10-CM | POA: Diagnosis not present

## 2016-10-14 DIAGNOSIS — Z880 Allergy status to penicillin: Secondary | ICD-10-CM | POA: Diagnosis not present

## 2016-10-14 DIAGNOSIS — Z170 Estrogen receptor positive status [ER+]: Secondary | ICD-10-CM | POA: Diagnosis not present

## 2016-10-14 DIAGNOSIS — C50912 Malignant neoplasm of unspecified site of left female breast: Secondary | ICD-10-CM | POA: Diagnosis not present

## 2016-10-14 DIAGNOSIS — Z833 Family history of diabetes mellitus: Secondary | ICD-10-CM | POA: Diagnosis not present

## 2016-10-14 LAB — GLUCOSE, CAPILLARY
Glucose-Capillary: 152 mg/dL — ABNORMAL HIGH (ref 65–99)
Glucose-Capillary: 200 mg/dL — ABNORMAL HIGH (ref 65–99)

## 2016-10-14 LAB — HIV ANTIBODY (ROUTINE TESTING W REFLEX): HIV SCREEN 4TH GENERATION: NONREACTIVE

## 2016-10-14 MED ORDER — METHOCARBAMOL 500 MG PO TABS: 500.0000 mg | ORAL_TABLET | Freq: Four times a day (QID) | ORAL | 0 refills | Status: DC | PRN

## 2016-10-14 NOTE — Progress Notes (Signed)
Inpatient Diabetes Program Recommendations  AACE/ADA: New Consensus Statement on Inpatient Glycemic Control (2015)  Target Ranges:  Prepandial:   less than 140 mg/dL      Peak postprandial:   less than 180 mg/dL (1-2 hours)      Critically ill patients:  140 - 180 mg/dL   Lab Results  Component Value Date   GLUCAP 152 (H) 10/14/2016    Review of Glycemic Control:  Results for MCKENA, CHERN (MRN [id number]) as of 10/14/2016 10:17  Ref. Range 10/13/2016 09:29 10/13/2016 12:46 10/13/2016 18:11 10/13/2016 21:21 10/14/2016 07:23  Glucose-Capillary Latest Ref Range: 65 - 99 mg/dL 222 (H) 227 (H) 263 (H) 219 (H) 152 (H)   Diabetes history: Type 2 diabetes Outpatient Diabetes medications: Amaryl 4 mg bid, Metformin 1000 mg bid Current orders for Inpatient glycemic control:  Amaryl 4 mg bid, Novolog moderate tid with meals  Inpatient Diabetes Program Recommendations:    While in the hospital consider d/c of Amaryl PO and add Novolog meal coverage 4 units tid with meals-to be held if patient eats less than 50%.  Thanks, Adah Perl, RN, BC-ADM Inpatient Diabetes Coordinator Pager [phone] (8a-5p)

## 2016-10-14 NOTE — Care Management Obs Status (Signed)
[place] NOTIFICATION   Patient Details  Name: [name] MRN: [id number] Date of Birth: May 29, 1956   Medicare Observation Status Notification Given:  Yes    Carles Collet, RN 10/14/2016, 9:37 AM

## 2016-10-14 NOTE — Evaluation (Signed)
Physical Therapy Evaluation Patient Details Name: [name] MRN: [id number] DOB: 11-27-1955 Today's Date: 10/14/2016   History of Present Illness  34 yof with mmp who underwent left breast lumpectomy/sn biopsy for clinical stage I left breast cancer. She remained overnight due to chronic medical issues for monitoring.  PMH:  chronic back pain with surgery, DM  Clinical Impression  Pt admitted with above diagnosis. Pt currently with functional limitations due to the deficits listed below (see PT Problem List). Pt did well with RW overall.  Will use it at home prn per pt and family.  Pt would benefit from HHPT f/u for safety eval.  Will follow acutely.  Pt will benefit from skilled PT to increase their independence and safety with mobility to allow discharge to the venue listed below.      Follow Up Recommendations Home health PT;Supervision/Assistance - 24 hour    Equipment Recommendations  None recommended by PT    Recommendations for Other Services       Precautions / Restrictions Precautions Precautions: Fall Restrictions Weight Bearing Restrictions: No      Mobility  Bed Mobility Overal bed mobility: Independent                Transfers Overall transfer level: Modified independent Equipment used: Rolling walker (2 wheeled)                Ambulation/Gait Ambulation/Gait assistance: Min guard Ambulation Distance (Feet): 140 Feet Assistive device: Rolling walker (2 wheeled) Gait Pattern/deviations: Step-through pattern;Decreased stride length;Antalgic   Gait velocity interpretation: Below normal speed for age/gender General Gait Details: Pt was able to ambulate with RW with good safety overall.  She states she will use RW when she goes home for added support and progress to cane when she is ready.  Stairs            Wheelchair Mobility    Modified Rankin (Stroke Patients Only)       Balance Overall balance assessment: Needs  assistance Sitting-balance support: No upper extremity supported;Feet supported Sitting balance-Leahy Scale: Good     Standing balance support: Bilateral upper extremity supported;During functional activity Standing balance-Leahy Scale: Fair Standing balance comment: Pt able to stand statically without UE support.                              Pertinent Vitals/Pain Pain Assessment: 0-10 Pain Score: 7  Pain Location: left incision Pain Descriptors / Indicators: Aching;Grimacing;Guarding;Operative site guarding Pain Intervention(s): Limited activity within patient's tolerance;Monitored during session;Premedicated before session;Repositioned;Ice applied    Home Living Family/patient expects to be discharged to:: Private residence Living Arrangements: Spouse/significant other Available Help at Discharge: Family;Available 24 hours/day Type of Home: House Home Access: Stairs to enter   [employer] of Steps: 1 Home Layout: One level Home Equipment: Grab bars - tub/shower;Grab bars - toilet;Tub bench;Cane - single point;Walker - 2 wheels      Prior Function Level of Independence: Independent               Hand Dominance   Dominant Hand: Left    Extremity/Trunk Assessment   Upper Extremity Assessment Upper Extremity Assessment: Defer to OT evaluation    Lower Extremity Assessment Lower Extremity Assessment: Generalized weakness    Cervical / Trunk Assessment Cervical / Trunk Assessment: Normal  Communication   Communication: No difficulties  Cognition Arousal/Alertness: Awake/alert Behavior During Therapy: WFL for tasks assessed/performed Overall Cognitive Status: Within Functional Limits  for tasks assessed                                        General Comments General comments (skin integrity, edema, etc.): Filled ice pack for left surgical area. Gave lumpectomy care handout and discussed precautions with pt.      Exercises     Assessment/Plan    PT Assessment Patient needs continued PT services  PT Problem List Decreased strength;Decreased range of motion;Decreased activity tolerance;Decreased balance;Decreased mobility;Decreased knowledge of use of DME;Decreased safety awareness;Decreased knowledge of precautions;Pain       PT Treatment Interventions DME instruction;Gait training;Functional mobility training;Therapeutic activities;Therapeutic exercise;Balance training;Stair training;Patient/family education    PT Goals (Current goals can be found in the Care Plan section)  Acute Rehab PT Goals Patient Stated Goal: to go home PT Goal Formulation: With patient Time For Goal Achievement: 10/28/16 Potential to Achieve Goals: Good    Frequency Min 3X/week   Barriers to discharge        Co-evaluation               AM-PAC PT "6 Clicks" Daily Activity  Outcome Measure Difficulty turning over in bed (including adjusting bedclothes, sheets and blankets)?: None Difficulty moving from lying on back to sitting on the side of the bed? : None Difficulty sitting down on and standing up from a chair with arms (e.g., wheelchair, bedside commode, etc,.)?: A Little Help needed moving to and from a bed to chair (including a wheelchair)?: A Little Help needed walking in hospital room?: A Little Help needed climbing 3-5 steps with a railing? : A Lot 6 Click Score: 19    End of Session Equipment Utilized During Treatment: Gait belt Activity Tolerance: Patient tolerated treatment well Patient left: in bed;with call bell/phone within reach;with family/visitor present Nurse Communication: Mobility status PT Visit Diagnosis: Unsteadiness on feet (R26.81);Muscle weakness (generalized) (M62.81)    Time: 1010-1033 PT Time Calculation (min) (ACUTE ONLY): 23 min   Charges:   PT Evaluation $PT Eval Moderate Complexity: 1 Procedure PT Treatments $Gait Training: 8-22 mins   PT G Codes:   PT G-Codes  **NOT FOR INPATIENT CLASS** Functional Assessment Tool Used: AM-PAC 6 Clicks Basic Mobility Functional Limitation: Mobility: Walking and moving around Mobility: Walking and Moving Around Current Status (R0076): At least 1 percent but less than 20 percent impaired, limited or restricted Mobility: Walking and Moving Around Goal Status [phone]): At least 1 percent but less than 20 percent impaired, limited or restricted    [facility] [phone] (pager)   Denice Paradise 10/14/2016, 12:24 PM

## 2016-10-14 NOTE — Discharge Instructions (Signed)
[facility] °Office Phone Number [phone] ° °BREAST BIOPSY/ PARTIAL MASTECTOMY: POST OP INSTRUCTIONS ° °Always review your discharge instruction sheet given to you by the facility where your surgery was performed. ° °IF YOU HAVE DISABILITY OR FAMILY LEAVE FORMS, YOU MUST BRING THEM TO THE OFFICE FOR PROCESSING.  DO NOT GIVE THEM TO YOUR DOCTOR. ° °1. A prescription for pain medication may be given to you upon discharge.  Take your pain medication as prescribed, if needed.  If narcotic pain medicine is not needed, then you may take acetaminophen (Tylenol), naprosyn (Alleve) or ibuprofen (Advil) as needed. °2. Take your usually prescribed medications unless otherwise directed °3. If you need a refill on your pain medication, please contact your pharmacy.  They will contact our office to request authorization.  Prescriptions will not be filled after 5pm or on week-ends. °4. You should eat very light the first 24 hours after surgery, such as soup, crackers, pudding, etc.  Resume your normal diet the day after surgery. °5. Most patients will experience some swelling and bruising in the breast.  Ice packs and a good support bra will help.  Wear the breast binder provided or a sports bra for 72 hours day and night.  After that wear a sports bra during the day until you return to the office. Swelling and bruising can take several days to resolve.  °6. It is common to experience some constipation if taking pain medication after surgery.  Increasing fluid intake and taking a stool softener will usually help or prevent this problem from occurring.  A mild laxative (Milk of Magnesia or Miralax) should be taken according to package directions if there are no bowel movements after 48 hours. °7. Unless discharge instructions indicate otherwise, you may remove your bandages 48 hours after surgery and you may shower at that time.  You may have steri-strips (small skin tapes) in place directly over the incision.   These strips should be left on the skin for 7-10 days and will come off on their own.  If your surgeon used skin glue on the incision, you may shower in 24 hours.  The glue will flake off over the next 2-3 weeks.  Any sutures or staples will be removed at the office during your follow-up visit. °8. ACTIVITIES:  You may resume regular daily activities (gradually increasing) beginning the next day.  Wearing a good support bra or sports bra minimizes pain and swelling.  You may have sexual intercourse when it is comfortable. °a. You may drive when you no longer are taking prescription pain medication, you can comfortably wear a seatbelt, and you can safely maneuver your car and apply brakes. °b. RETURN TO WORK:  ______________________________________________________________________________________ °9. You should see your doctor in the office for a follow-up appointment approximately two weeks after your surgery.  Your doctor’s nurse will typically make your follow-up appointment when she calls you with your pathology report.  Expect your pathology report 3-4 business days after your surgery.  You may call to check if you do not hear from us after three days. °10. OTHER INSTRUCTIONS: _______________________________________________________________________________________________ _____________________________________________________________________________________________________________________________________ °_____________________________________________________________________________________________________________________________________ °_____________________________________________________________________________________________________________________________________ ° °WHEN TO CALL DR Chee Kinslow: °1. Fever over 101.0 °2. Nausea and/or vomiting. °3. Extreme swelling or bruising. °4. Continued bleeding from incision. °5. Increased pain, redness, or drainage from the incision. ° °The clinic staff is available to  answer your questions during regular business hours.  Please don’t hesitate to call and ask to speak to one of the nurses for   clinical concerns.  If you have a medical emergency, go to the nearest emergency room or call 911.  A surgeon from [facility] is always on call at the hospital. ° °For further questions, please visit [email or web address] ° °

## 2016-10-14 NOTE — Discharge Summary (Signed)
Physician Discharge Summary  Patient ID: [name] MRN: [id number] DOB/AGE: March 10, 1956 61 y.o.  Admit date: 10/13/2016 Discharge date: 10/14/2016  Admission Diagnoses: Left breast cancer Diabetes Obesity Chronic back pain  Discharge Diagnoses:  Active Problems:   Breast cancer, stage 1, left ([place])   Discharged Condition: good  Hospital Course: 87 yof with mmp who underwent left breast lumpectomy/sn biopsy for clinical stage I left breast cancer. She remained overnight due to chronic medical issues for monitoring. She is doing well the following day and will be discharged  Consults: None  Significant Diagnostic Studies: none   Treatments: surgery: left lump/sn  Discharge Exam: Blood pressure (!) 114/91, pulse 99, temperature 98.8 F (37.1 C), temperature source Oral, resp. rate 18, SpO2 97 %. Incision/Wound:clean without infection or hematoma  Disposition: 01-Home or Self Care   Allergies as of 10/14/2016      Reactions   Adhesive [tape] Other (See Comments)   Tears skin   Amoxicillin-pot Clavulanate Nausea And Vomiting   Codeine Itching   Penicillins Other (See Comments)   Upset stomach   Sulfonamide Derivatives Other (See Comments)   Burning sensation      Medication List    TAKE these medications   albuterol 108 (90 Base) MCG/ACT inhaler Commonly known as:  PROVENTIL HFA;VENTOLIN HFA Inhale 2 puffs into the lungs every 6 (six) hours as needed for wheezing or shortness of breath.   ALPRAZolam 1 MG tablet Commonly known as:  XANAX Take 1 mg by mouth 4 (four) times daily as needed.   atenolol 25 MG tablet Commonly known as:  TENORMIN Take 25 mg by mouth daily.   bisacodyl 5 MG EC tablet Commonly known as:  DULCOLAX Take 10 mg by mouth daily as needed for moderate constipation.   fluticasone 50 MCG/ACT nasal spray Commonly known as:  FLONASE Place 1 spray into both nostrils daily as needed for allergies or rhinitis.   glimepiride 4 MG  tablet Commonly known as:  AMARYL Take 4 mg by mouth 2 (two) times daily.   lidocaine 5 % Commonly known as:  LIDODERM Place 1 patch onto the skin daily as needed (pain). Remove & Discard patch within 12 hours or as directed by MD   losartan 25 MG tablet Commonly known as:  COZAAR Take 25 mg by mouth daily.   metFORMIN 1000 MG tablet Commonly known as:  GLUCOPHAGE Take 1,000 mg by mouth 2 (two) times daily.   methocarbamol 500 MG tablet Commonly known as:  ROBAXIN Take 1 tablet (500 mg total) by mouth every 6 (six) hours as needed for muscle spasms.   montelukast 10 MG tablet Commonly known as:  SINGULAIR Take 10 mg by mouth daily as needed.   Oxycodone HCl 10 MG Tabs Take 10 mg by mouth every 6 (six) hours as needed (pain).   terbinafine 1 % cream Commonly known as:  LAMISIL Apply 1 application topically 2 (two) times daily. For feet      Follow-up Information    Maely Clements, MD Follow up in 3 week(s).   Specialty:  General Surgery Contact information: [address] [place] [address] [phone]           Signed: [name] 10/14/2016, 11:25 AM

## 2016-10-14 NOTE — Care Management (Signed)
PT recommending home health PT. Patient already discharged and left hospital. Left voicemail. Awaiting call back.   If patient agreeable will need home health PT order and face to face.   Magdalen Spatz RN BSN [phone]

## 2016-10-16 ENCOUNTER — Telehealth: Payer: Self-pay | Admitting: *Deleted

## 2016-10-16 NOTE — Telephone Encounter (Signed)
Received order for oncotype testing. Requisition sent to pathology 

## 2016-10-29 ENCOUNTER — Ambulatory Visit: Payer: Medicare Other | Attending: General Surgery | Admitting: Physical Therapy

## 2016-10-29 DIAGNOSIS — M25612 Stiffness of left shoulder, not elsewhere classified: Secondary | ICD-10-CM | POA: Diagnosis not present

## 2016-10-29 DIAGNOSIS — M25512 Pain in left shoulder: Secondary | ICD-10-CM

## 2016-10-29 NOTE — Therapy (Signed)
is a re-eval today.   Rehab Potential Good   Clinical Impairments Affecting Rehab Potential Above stated comorbidities   PT Frequency One time visit   PT Treatment/Interventions Therapeutic exercise;Patient/family education   PT Next Visit Plan No further follow-up planned.  Patient has a home exercise program.   PT Home Exercise Plan Post-op shoulder ROM HEP but with more focus on positioning for radiation treatment.   Consulted and  Agree with Plan of Care Patient      Patient will benefit from skilled therapeutic intervention in order to improve the following deficits and impairments:  Decreased range of motion, Pain, Impaired UE functional use  Visit Diagnosis: Stiffness of left shoulder, not elsewhere classified - Plan: PT plan of care cert/re-cert  Acute pain of left shoulder - Plan: PT plan of care cert/re-cert      G-Codes - 99/35/70 1201    Functional Assessment Tool Used (Outpatient Only) quick DASH   Functional Limitation Carrying, moving and handling objects   Carrying, Moving and Handling Objects Current Status (V7793) At least 60 percent but less than 80 percent impaired, limited or restricted   Carrying, Moving and Handling Objects Goal Status (J0300) At least 60 percent but less than 80 percent impaired, limited or restricted   Carrying, Moving and Handling Objects Discharge Status [phone]) At least 60 percent but less than 80 percent impaired, limited or restricted       Problem List Patient Active Problem List   Diagnosis Date Noted  . Breast cancer, stage 1, left ([place]) 10/13/2016  . Malignant neoplasm of upper-outer quadrant of left breast in female, estrogen receptor positive ([place]) 09/30/2016  . Hx of colonic polyps 03/27/2016  . Dysphagia 09/03/2011  . Chest pain 01/02/2011  . Edema 01/02/2011  . WOUND INFECTION 10/20/2007  . [place] DISEASE 10/20/2007  . LAMINECTOMY, LUMBAR, HX OF 10/20/2007    Brittanni Cariker 10/29/2016, 12:04 PM  [place], [place], [address] Phone: [phone]   Fax:  [phone]  Name: HILDAGARDE HOLLERAN MRN: [id number] Date of Birth: [birth year]  Serafina Royals, PT 10/29/16 12:04 PM  is a re-eval today.   Rehab Potential Good   Clinical Impairments Affecting Rehab Potential Above stated comorbidities   PT Frequency One time visit   PT Treatment/Interventions Therapeutic exercise;Patient/family education   PT Next Visit Plan No further follow-up planned.  Patient has a home exercise program.   PT Home Exercise Plan Post-op shoulder ROM HEP but with more focus on positioning for radiation treatment.   Consulted and  Agree with Plan of Care Patient      Patient will benefit from skilled therapeutic intervention in order to improve the following deficits and impairments:  Decreased range of motion, Pain, Impaired UE functional use  Visit Diagnosis: Stiffness of left shoulder, not elsewhere classified - Plan: PT plan of care cert/re-cert  Acute pain of left shoulder - Plan: PT plan of care cert/re-cert      G-Codes - 99/35/70 1201    Functional Assessment Tool Used (Outpatient Only) quick DASH   Functional Limitation Carrying, moving and handling objects   Carrying, Moving and Handling Objects Current Status (V7793) At least 60 percent but less than 80 percent impaired, limited or restricted   Carrying, Moving and Handling Objects Goal Status (J0300) At least 60 percent but less than 80 percent impaired, limited or restricted   Carrying, Moving and Handling Objects Discharge Status [phone]) At least 60 percent but less than 80 percent impaired, limited or restricted       Problem List Patient Active Problem List   Diagnosis Date Noted  . Breast cancer, stage 1, left ([place]) 10/13/2016  . Malignant neoplasm of upper-outer quadrant of left breast in female, estrogen receptor positive ([place]) 09/30/2016  . Hx of colonic polyps 03/27/2016  . Dysphagia 09/03/2011  . Chest pain 01/02/2011  . Edema 01/02/2011  . WOUND INFECTION 10/20/2007  . [place] DISEASE 10/20/2007  . LAMINECTOMY, LUMBAR, HX OF 10/20/2007    Brittanni Cariker 10/29/2016, 12:04 PM  [place], [place], [address] Phone: [phone]   Fax:  [phone]  Name: HILDAGARDE HOLLERAN MRN: [id number] Date of Birth: [birth year]  Serafina Royals, PT 10/29/16 12:04 PM  [place], [place], [address] Phone: [phone]   Fax:  [phone]  Physical Therapy Evaluation  Patient Details  Name: [name] MRN: [id number] Date of Birth: [birth year] Referring Provider: Dr. [name]  Encounter Date: 10/29/2016      PT End of Session - 10/29/16 1154    Visit Number 1   Number of Visits 1   PT Start Time 0350   PT Stop Time 1124   PT Time Calculation (min) 46 min   Activity Tolerance Patient tolerated treatment well   Behavior During Therapy [facility] for tasks assessed/performed      Past Medical History:  Diagnosis Date  . Anxiety   . Asthma   . Cancer [facility])    Left breast  . Complication of anesthesia    pt states she is hard to wake up after anesthetic-"she was told this at [facility]."   . Constipation   . Constipation   . DDD (degenerative disc disease), lumbar   . Depression    situational  . Diabetes mellitus    Type II  . Dyspnea   . Family history of adverse reaction to anesthesia    Brother- N/V  . Fibromyalgia   . GERD (gastroesophageal reflux disease)   . Headache   . History of blood transfusion    with childbirth  . Hypertension   . Pneumonia 2008  . PONV (postoperative nausea and vomiting)   . Stroke [facility])    mini stroke 2008    Past Surgical History:  Procedure Laterality Date  . ABDOMINAL HYSTERECTOMY     Cervical- cancer cell  . ANTERIOR CERVICAL DISCECTOMY  2007  . BACK SURGERY  2009   Laminectomy  . Back surgery 4 yrs ago  2008  . BALLOON DILATION  10/02/2011   Procedure: BALLOON DILATION;  Surgeon: [name], MD;  [facility];  Service: Endoscopy;  Laterality: N/A;  . BREAST LUMPECTOMY WITH RADIOACTIVE SEED AND SENTINEL LYMPH NODE BIOPSY Left 10/13/2016   Procedure: BREAST LUMPECTOMY WITH RADIOACTIVE SEED AND SENTINEL LYMPH NODE BIOPSY;  Surgeon: [name], MD;  Location: [facility];  Service:  General;  Laterality: Left;  . CHOLECYSTECTOMY    . COLONOSCOPY WITH PROPOFOL N/A 04/18/2016   Procedure: COLONOSCOPY WITH PROPOFOL;  Surgeon: [name], MD;  [facility];  Service: Endoscopy;  Laterality: N/A;  955  . FOOT SURGERY     tried to remove bone spur- was unable- "just clean"  . MALONEY DILATION  10/02/2011   Procedure: MALONEY DILATION;  Surgeon: [name], MD;  [facility];  Service: Endoscopy;  Laterality: N/A;  . SAVORY DILATION  10/02/2011   Procedure: SAVORY DILATION;  Surgeon: [name], MD;  [facility];  Service: Endoscopy;  Laterality: N/A;    There were no vitals filed for this visit.       Subjective Assessment - 10/29/16 1040    Subjective "It's recommended by my doctor to have an evaluation of after cancer surgery."  Pt. describes having had a bad experience after back surgery with a PM&R doctor.  Still wearing her compression smocked vest at night.  Says some of the discomfort is recending, now two weeks after surgery.   Pertinent History Patient was diagnosed on 08/28/16 with left grade 1 invasive ductal carcinoma breast cancer. It measures 9 mm and is located in the upper outer quadrant. It is ER/PR  [place], [place], [address] Phone: [phone]   Fax:  [phone]  Physical Therapy Evaluation  Patient Details  Name: [name] MRN: [id number] Date of Birth: [birth year] Referring Provider: Dr. [name]  Encounter Date: 10/29/2016      PT End of Session - 10/29/16 1154    Visit Number 1   Number of Visits 1   PT Start Time 0350   PT Stop Time 1124   PT Time Calculation (min) 46 min   Activity Tolerance Patient tolerated treatment well   Behavior During Therapy [facility] for tasks assessed/performed      Past Medical History:  Diagnosis Date  . Anxiety   . Asthma   . Cancer [facility])    Left breast  . Complication of anesthesia    pt states she is hard to wake up after anesthetic-"she was told this at [facility]."   . Constipation   . Constipation   . DDD (degenerative disc disease), lumbar   . Depression    situational  . Diabetes mellitus    Type II  . Dyspnea   . Family history of adverse reaction to anesthesia    Brother- N/V  . Fibromyalgia   . GERD (gastroesophageal reflux disease)   . Headache   . History of blood transfusion    with childbirth  . Hypertension   . Pneumonia 2008  . PONV (postoperative nausea and vomiting)   . Stroke [facility])    mini stroke 2008    Past Surgical History:  Procedure Laterality Date  . ABDOMINAL HYSTERECTOMY     Cervical- cancer cell  . ANTERIOR CERVICAL DISCECTOMY  2007  . BACK SURGERY  2009   Laminectomy  . Back surgery 4 yrs ago  2008  . BALLOON DILATION  10/02/2011   Procedure: BALLOON DILATION;  Surgeon: [name], MD;  [facility];  Service: Endoscopy;  Laterality: N/A;  . BREAST LUMPECTOMY WITH RADIOACTIVE SEED AND SENTINEL LYMPH NODE BIOPSY Left 10/13/2016   Procedure: BREAST LUMPECTOMY WITH RADIOACTIVE SEED AND SENTINEL LYMPH NODE BIOPSY;  Surgeon: [name], MD;  Location: [facility];  Service:  General;  Laterality: Left;  . CHOLECYSTECTOMY    . COLONOSCOPY WITH PROPOFOL N/A 04/18/2016   Procedure: COLONOSCOPY WITH PROPOFOL;  Surgeon: [name], MD;  [facility];  Service: Endoscopy;  Laterality: N/A;  955  . FOOT SURGERY     tried to remove bone spur- was unable- "just clean"  . MALONEY DILATION  10/02/2011   Procedure: MALONEY DILATION;  Surgeon: [name], MD;  [facility];  Service: Endoscopy;  Laterality: N/A;  . SAVORY DILATION  10/02/2011   Procedure: SAVORY DILATION;  Surgeon: [name], MD;  [facility];  Service: Endoscopy;  Laterality: N/A;    There were no vitals filed for this visit.       Subjective Assessment - 10/29/16 1040    Subjective "It's recommended by my doctor to have an evaluation of after cancer surgery."  Pt. describes having had a bad experience after back surgery with a PM&R doctor.  Still wearing her compression smocked vest at night.  Says some of the discomfort is recending, now two weeks after surgery.   Pertinent History Patient was diagnosed on 08/28/16 with left grade 1 invasive ductal carcinoma breast cancer. It measures 9 mm and is located in the upper outer quadrant. It is ER/PR

## 2016-10-31 ENCOUNTER — Telehealth: Payer: Self-pay | Admitting: *Deleted

## 2016-10-31 DIAGNOSIS — C50412 Malignant neoplasm of upper-outer quadrant of left female breast: Secondary | ICD-10-CM | POA: Diagnosis not present

## 2016-10-31 DIAGNOSIS — Z170 Estrogen receptor positive status [ER+]: Secondary | ICD-10-CM | POA: Diagnosis not present

## 2016-10-31 NOTE — Telephone Encounter (Signed)
Received Oncotype Dx results of 25/17%.  Placed a copy in Dr. Virgie Dad box, gave Varney Biles a copy and took a copy to HIM to scan.

## 2016-11-03 ENCOUNTER — Encounter (HOSPITAL_COMMUNITY): Payer: Self-pay

## 2016-11-04 ENCOUNTER — Ambulatory Visit (HOSPITAL_BASED_OUTPATIENT_CLINIC_OR_DEPARTMENT_OTHER): Payer: Medicare Other | Admitting: Oncology

## 2016-11-04 VITALS — BP 135/77 | HR 77 | Temp 97.6°F | Resp 18 | Ht 61.5 in | Wt 225.1 lb

## 2016-11-04 DIAGNOSIS — C50412 Malignant neoplasm of upper-outer quadrant of left female breast: Secondary | ICD-10-CM

## 2016-11-04 DIAGNOSIS — Z170 Estrogen receptor positive status [ER+]: Secondary | ICD-10-CM | POA: Diagnosis not present

## 2016-11-04 NOTE — Progress Notes (Signed)
[facility]  Telephone:([phone] [phone]     ID: MAITTE BARRICKMAN DOB: February 02, 1956  MR#: [id number]  YQI#:[id number]  Patient Care Team: Elfredia Nevins, MD as PCP - General (Internal Medicine) Emelia Loron, MD as Consulting Physician (General Surgery) Vikrant Pryce, Valentino Hue, MD as Consulting Physician (Oncology) Antony Blackbird, MD as Consulting Physician (Radiation Oncology) Malissa Hippo, MD as Consulting Physician (Gastroenterology) Lowella Dell, MD OTHER MD:  CHIEF COMPLAINT: Estrogen receptor positive breast cancer  CURRENT TREATMENT: Adjuvant radiation pending   BREAST CANCER HISTORY: From the original intake note:  Lorianna had bilateral screening mammography with tomography at [facility] 08/28/2016. There was a possible mass in the left breast and on 09/16/2016 she was brought back for left diagnostic mammography with tomography and left breast ultrasonography. The left breast density was category B. In the left breast there was an irregular mass with architectural distortion in the upper outer quadrant, which appear to measure approximately 0.8 cm. Thank you by ultrasound this measured 0.9 cm and it was located at the 2:00 axis 7 cm from the nipple. Ultrasound of the axilla was reported as negative.   Biopsy of the left breast mass in question 09/23/2016 showed [facility] [phone]) ductal carcinoma in situ, E-cadherin positive, estrogen receptor 100% positive with strong staining intensity, progesterone receptor 90% positive with moderate staining intensity, with an MIB-1 of 10%, and no HER-2 amplification, signals ratio being 1.05 and the number per cell 1.10.  Her subsequent history is as detailed below  INTERVAL HISTORY: Jill Side returns today for follow-up of her breast cancer, accompanied by her "X" husband and her sister. Since her last visit here clean underwent left lumpectomy and sentinel lymph node biopsy. The date of the procedure  was 10/13/2016. The results ([facility] [phone]) showed an invasive ductal carcinoma, grade 1, measuring 1.1 cm. Margins were negative. All 4 sentinel lymph nodes were clear.  Accordingly this was again anatomically low risk tumor. We obtained an Oncotype DX score to confirm the fact that chemotherapy would not be useful. However it came back in the intermediate range, with a score of 25, predicting a risk of recurrence outside the breast for the next 10 years of 17% if the patient's only systemic therapy is tamoxifen for 5 years.  The patient is here today to discuss those results.  REVIEW OF SYSTEMS: She did well with the surgery, specifically with no significant problems with fever or bleeding or pain. She did have some discomfort and took narcotics briefly for that. She does not exercise regularly except for walking her dog, doing some shopping, and doing some housework. A detailed review of systems today was otherwise stable   PAST MEDICAL HISTORY: Past Medical History:  Diagnosis Date  . Anxiety   . Asthma   . Cancer [facility])    Left breast  . Complication of anesthesia    pt states she is hard to wake up after anesthetic-"she was told this at [facility]."   . Constipation   . Constipation   . DDD (degenerative disc disease), lumbar   . Depression    situational  . Diabetes mellitus    Type II  . Dyspnea   . Family history of adverse reaction to anesthesia    Brother- N/V  . Fibromyalgia   . GERD (gastroesophageal reflux disease)   . Headache   . History of blood transfusion    with childbirth  . Hypertension   . Pneumonia 2008  . PONV (postoperative  nausea and vomiting)   . Stroke [facility])    mini stroke 2008    PAST SURGICAL HISTORY: Past Surgical History:  Procedure Laterality Date  . ABDOMINAL HYSTERECTOMY     Cervical- cancer cell  . ANTERIOR CERVICAL DISCECTOMY  2007  . BACK SURGERY  2009   Laminectomy  . Back surgery 4 yrs ago  2008  . BALLOON DILATION   10/02/2011   Procedure: BALLOON DILATION;  Surgeon: Malissa Hippo, MD;  Location: [facility];  Service: Endoscopy;  Laterality: N/A;  . BREAST LUMPECTOMY WITH RADIOACTIVE SEED AND SENTINEL LYMPH NODE BIOPSY Left 10/13/2016   Procedure: BREAST LUMPECTOMY WITH RADIOACTIVE SEED AND SENTINEL LYMPH NODE BIOPSY;  Surgeon: Emelia Loron, MD;  Location: [facility] OR;  Service: General;  Laterality: Left;  . CHOLECYSTECTOMY    . COLONOSCOPY WITH PROPOFOL N/A 04/18/2016   Procedure: COLONOSCOPY WITH PROPOFOL;  Surgeon: Malissa Hippo, MD;  Location: [facility];  Service: Endoscopy;  Laterality: N/A;  955  . FOOT SURGERY     tried to remove bone spur- was unable- "just clean"  . MALONEY DILATION  10/02/2011   Procedure: MALONEY DILATION;  Surgeon: Malissa Hippo, MD;  Location: [facility];  Service: Endoscopy;  Laterality: N/A;  . SAVORY DILATION  10/02/2011   Procedure: SAVORY DILATION;  Surgeon: Malissa Hippo, MD;  Location: [facility];  Service: Endoscopy;  Laterality: N/A;    FAMILY HISTORY Family History  Problem Relation Age of Onset  . Lung cancer Father   The patient's father died at the age of 28, 4 years after being diagnosed with lung cancer in the setting of tobacco abuse. The patient's mother died at the age of 60 from a heart attack. The patient had 6 brothers, 3 sisters. There is no history of breast or ovarian cancer in the family to her knowledge.  GYNECOLOGIC HISTORY:  No LMP recorded. Patient has had a hysterectomy. Menarche age 29, first live birth age 51, the patient is GX P2. She underwent hysterectomy with unilateral salpingo-oophorectomy at age 60. She used estradiol as hormone replacement until her diagnosis of breast cancer in April 2018.  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 She also has significant fibromyalgia. She is separated but not divorced from her husband Leyli Bowdoin. He has significant  medical problems. The patient's son K Carin Hock lives in [place]; he works at [employer]. The patient tells me Thayer Ohm was married in [place] because it was his husband's dream. She greatly enjoyed the wedding. Son Letha Cape lives in [place] and works in a Airline pilot. The patient has no grandchildren. She is a Control and instrumentation engineer.    ADVANCED DIRECTIVES: Not in place   HEALTH MAINTENANCE: Social History  Substance Use Topics  . Smoking status: Never Smoker  . Smokeless tobacco: Never Used  . Alcohol use 0.0 oz/week     Comment: glass of wine- occasional     Colonoscopy:  PAP:  Bone density:   Allergies  Allergen Reactions  . Adhesive [Tape] Other (See Comments)    Tears skin  . Amoxicillin-Pot Clavulanate Nausea And Vomiting  . Codeine Itching  . Penicillins Other (See Comments)    Upset stomach  . Sulfonamide Derivatives Other (See Comments)    Burning sensation    Current Outpatient Prescriptions  Medication Sig Dispense Refill  . albuterol (PROVENTIL HFA;VENTOLIN HFA) 108 (90 BASE) MCG/ACT inhaler Inhale 2  puffs into the lungs every 6 (six) hours as needed for wheezing or shortness of breath.    . ALPRAZolam (XANAX) 1 MG tablet Take 1 mg by mouth 4 (four) times daily as needed.     Marland Kitchen atenolol (TENORMIN) 25 MG tablet Take 25 mg by mouth daily.     . bisacodyl (DULCOLAX) 5 MG EC tablet Take 10 mg by mouth daily as needed for moderate constipation.    . fluticasone (FLONASE) 50 MCG/ACT nasal spray Place 1 spray into both nostrils daily as needed for allergies or rhinitis.    Marland Kitchen glimepiride (AMARYL) 4 MG tablet Take 4 mg by mouth 2 (two) times daily.    Marland Kitchen lidocaine (LIDODERM) 5 % Place 1 patch onto the skin daily as needed (pain). Remove & Discard patch within 12 hours or as directed by MD     . losartan (COZAAR) 25 MG tablet Take 25 mg by mouth daily.    . metFORMIN (GLUCOPHAGE) 1000 MG tablet Take 1,000 mg by mouth 2 (two) times daily.     . methocarbamol (ROBAXIN) 500 MG  tablet Take 1 tablet (500 mg total) by mouth every 6 (six) hours as needed for muscle spasms. 20 tablet 0  . montelukast (SINGULAIR) 10 MG tablet Take 10 mg by mouth daily as needed.    . Oxycodone HCl 10 MG TABS Take 10 mg by mouth every 6 (six) hours as needed (pain).     Marland Kitchen terbinafine (LAMISIL) 1 % cream Apply 1 application topically 2 (two) times daily. For feet     No current facility-administered medications for this visit.     OBJECTIVE: Middle-aged white woman Who appears stated age Vitals:   11/04/16 1300  BP: 135/77  Pulse: 77  Resp: 18  Temp: 97.6 F (36.4 C)     Body mass index is 41.84 kg/m.    ECOG FS:2 - Symptomatic, <50% confined to bed  Sclerae unicteric, EOMs intact Oropharynx clear and moist No cervical or supraclavicular adenopathy Lungs no rales or rhonchi Heart regular rate and rhythm Abd soft, nontender, positive bowel sounds MSK no focal spinal tenderness, no upper extremity lymphedema Neuro: nonfocal, well oriented, appropriate affect Breasts: The right breast is benign. The left breast as undergone lumpectomy, they've the incision healing nicely. There is no evidence of residual or recurrent disease. Both axillae are benign.   LAB RESULTS:  CMP     Component Value Date/Time   NA 139 10/01/2016 0806   K 4.2 10/01/2016 0806   CL 104 04/11/2016 1108   CO2 27 10/01/2016 0806   GLUCOSE 267 (H) 10/01/2016 0806   BUN 19.4 10/01/2016 0806   CREATININE 1.2 (H) 10/01/2016 0806   CALCIUM 9.9 10/01/2016 0806   PROT 6.9 10/01/2016 0806   ALBUMIN 3.8 10/01/2016 0806   AST 30 10/01/2016 0806   ALT 44 10/01/2016 0806   ALKPHOS 71 10/01/2016 0806   BILITOT 0.87 10/01/2016 0806   GFRNONAA >60 04/11/2016 1108   GFRAA >60 04/11/2016 1108    No results found for: TOTALPROTELP, ALBUMINELP, A1GS, A2GS, BETS, BETA2SER, GAMS, MSPIKE, SPEI  No results found for: KPAFRELGTCHN, LAMBDASER, KAPLAMBRATIO  Lab Results  Component Value Date   WBC 7.9 10/01/2016    NEUTROABS 3.7 10/01/2016   HGB 14.1 10/01/2016   HCT 41.2 10/01/2016   MCV 90.4 10/01/2016   PLT 188 10/01/2016      Chemistry      Component Value Date/Time   NA 139 10/01/2016 0806  K 4.2 10/01/2016 0806   CL 104 04/11/2016 1108   CO2 27 10/01/2016 0806   BUN 19.4 10/01/2016 0806   CREATININE 1.2 (H) 10/01/2016 0806      Component Value Date/Time   CALCIUM 9.9 10/01/2016 0806   ALKPHOS 71 10/01/2016 0806   AST 30 10/01/2016 0806   ALT 44 10/01/2016 0806   BILITOT 0.87 10/01/2016 0806       No results found for: LABCA2  No components found for: [id number]  No results for input(s): INR in the last 168 hours.  Urinalysis    Component Value Date/Time   COLORURINE YELLOW 07/30/2014 1600   APPEARANCEUR CLEAR 07/30/2014 1600   LABSPEC 1.020 07/30/2014 1600   PHURINE 5.5 07/30/2014 1600   GLUCOSEU >1000 (A) 07/30/2014 1600   HGBUR NEGATIVE 07/30/2014 1600   BILIRUBINUR NEGATIVE 07/30/2014 1600   KETONESUR NEGATIVE 07/30/2014 1600   PROTEINUR NEGATIVE 07/30/2014 1600   UROBILINOGEN 0.2 07/30/2014 1600   NITRITE NEGATIVE 07/30/2014 1600   LEUKOCYTESUR NEGATIVE 07/30/2014 1600     STUDIES: Mm Breast Surgical Specimen  Result Date: 10/13/2016 CLINICAL DATA:  Evaluate surgical specimen following left lumpectomy for left breast malignancy. EXAM: SPECIMEN RADIOGRAPH OF THE LEFT BREAST COMPARISON:  Previous exam(s). FINDINGS: Status post excision of the left breast. The radioactive seed and biopsy marker clip are present, completely intact, and were marked for pathology. IMPRESSION: Specimen radiograph of the left breast. Electronically Signed   By: Harmon Pier M.D.   On: 10/13/2016 08:41   Mm Lt Radioactive Seed Loc Mammo Guide  Result Date: 10/09/2016 CLINICAL DATA:  61 year old female for seed localization of a left breast biopsy site demonstrating carcinoma EXAM: MAMMOGRAPHIC GUIDED RADIOACTIVE SEED LOCALIZATION OF THE LEFT BREAST COMPARISON:  Previous exam(s).  FINDINGS: Patient presents for radioactive seed localization prior to left lumpectomy. I met with the patient and we discussed the procedure of seed localization including benefits and alternatives. We discussed the high likelihood of a successful procedure. We discussed the risks of the procedure including infection, bleeding, tissue injury and further surgery. We discussed the low dose of radioactivity involved in the procedure. Informed, written consent was given. The usual time-out protocol was performed immediately prior to the procedure. Using mammographic guidance, sterile technique, 1% lidocaine and an I-125 radioactive seed, the ribbon shaped biopsy marker in the upper, outer left breast was localized using a lateral to medial approach. The follow-up mammogram images confirm the seed in the expected location and were marked for Dr. Dwain Sarna. Follow-up survey of the patient confirms presence of the radioactive seed. Order number of I-125 seed:  [id number] Total activity:  0.234mCi     Reference Date: September 16, 2016 The patient tolerated the procedure well and was released from the [facility]. She was given instructions regarding seed removal. IMPRESSION: Radioactive seed localization of the left breast. No apparent complications. Electronically Signed   By: Dalphine Handing M.D.   On: 10/09/2016 14:13    ELIGIBLE FOR AVAILABLE RESEARCH PROTOCOL: no  ASSESSMENT: 61 y.o. [place], [place] woman status post left breast upper outer quadrant biopsy 09/23/2016 for a clinical T1b N0, sage IA t invasive ductal carcinoma, grade 1, estrogen and progesterone receptor positive, HER-2 not amplified, with an MIB-1 of 10%  (1) status post left lumpectomy and sentinel lymph node sampling 10/13/2016 for a pT1c pN0, stage IA invasive ductal carcinoma, grade 1, with negative margins.  (2) Oncotype DX score of 25 predicts a 10 year risk of recurrence outside the breast of  17% if the patient's only systemic therapy is  tamoxifen for 5 years  (a) the possible benefit from chemotherapy in the 5% range was discussed. The patient declined adjuvant chemotherapy  (3) adjuvant radiation to follow  (4) anti-estrogens to follow at the completion of local treatment    PLAN: I spent approximately 50 minutes with choline and her family going over her situation. She understands that her tumor is anatomically low risk. However genetically it is intermediate risk. It fills out exactly the midpoint of the intermediate risk panel.  We discussed the fact that many oncologists arbitrarily used 25 as the number for which and above which they would recommend chemotherapy. I am not aware of any category 1 data to justify this approach.  Accordingly I think the better approach is to simply discussed the risks with the patient. With the Oncotype is saying is that if she only takes tamoxifen for 5 years and does not take chemotherapy she will have a 17% risk of recurrence outside the breast within 10 years. However if she takes tamoxifen for 10 years or if she instead takes an aromatase inhibitor for 5 years the risk of recurrence is more likely to be 15%.  Since chemotherapy generally lowers the risk bilateral third that means she can expect a benefit from chemotherapy in the 5% range. One way to understand this is to think of 100 women like her. If all 100 of them to chemotherapy, 85 did not need any chemotherapy because aware already cured with tamoxifen. Another 10 at the cancer returned despite chemotherapy. Therefore 95% of women like her would get no benefit from chemotherapy.  However the other 5 would get a very large benefit: There would have avoided stage IV, incurable breast cancer.  We then discussed standard chemotherapy which would consist of doxorubicin and cyclophosphamide followed by paclitaxel. We discussed the possible toxicities, side effects and complications of these agents. After a very full discussion of all  this information, which was also given to her in writing, choline feels a 5% risk reduction is not motivating to her. She is intimidated by the possible complications and toxicities of chemotherapy and already feels her functional status is very marginal (she walks with difficulty and had difficulty getting onto to the examination table today for example).  In the end she felt comfortable declining chemotherapy and proceeding with radiation. This is being operational eyes.  She will return to see me at the end of radiation. I will be for a minimum of 5 years of anti-estrogens, preferably with an aromatase inhibitor.  She knows to call for any problems that may develop before her next visit here.   Lowella Dell, MD   11/04/2016 1:20 PM Medical Oncology and Hematology [facility] [address] [place], [place] [address] Tel. [phone]    Fax. [phone]

## 2016-11-05 ENCOUNTER — Encounter: Payer: Self-pay | Admitting: Radiation Oncology

## 2016-11-06 ENCOUNTER — Telehealth: Payer: Self-pay

## 2016-11-06 NOTE — Telephone Encounter (Signed)
FYI, Twenty minute call  "[name] calling again as I have not received a return call as requested.  I need to schedule a specific date and time to talk with Mom's doctor or another provider about results and treatment plan.    Okay to leave voicemail on my number [phone] with dates and times provider is available to schedule a specific time for discussion.  An alternative is to call my husband's phone [phone]) Dr. Lannette Donath who is a radiologist.  I am available this Friday (tomorrow), Monday or next Friday at any time of day.   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  She doesn't fully understand.  Things are lost in translation.  She tries to share the visit information but can't.  The numbers don't add up.  We need clarification to better explain so she can make a decision about treatment or radiation if radiation is needed."

## 2016-11-06 NOTE — Telephone Encounter (Signed)
Son called to set up a time/appt to speak with Dr Jana Hakim b/c he was unable to be with pt at her visit. Gerald Stabs' phone is [phone] Told him Dr Jana Hakim was out of office today. He said tomorrow would be his best time.

## 2016-11-11 DIAGNOSIS — Z170 Estrogen receptor positive status [ER+]: Secondary | ICD-10-CM | POA: Diagnosis not present

## 2016-11-11 DIAGNOSIS — C50412 Malignant neoplasm of upper-outer quadrant of left female breast: Secondary | ICD-10-CM | POA: Diagnosis not present

## 2016-11-11 DIAGNOSIS — E119 Type 2 diabetes mellitus without complications: Secondary | ICD-10-CM | POA: Diagnosis not present

## 2016-11-11 DIAGNOSIS — G629 Polyneuropathy, unspecified: Secondary | ICD-10-CM | POA: Diagnosis not present

## 2016-11-12 ENCOUNTER — Other Ambulatory Visit: Payer: Self-pay | Admitting: Oncology

## 2016-11-12 ENCOUNTER — Ambulatory Visit
Admission: RE | Admit: 2016-11-12 | Discharge: 2016-11-12 | Disposition: A | Discharge status: Home / Self Care | Payer: Medicare Other | Source: Ambulatory Visit | Attending: Radiation Oncology | Admitting: Radiation Oncology

## 2016-11-12 ENCOUNTER — Ambulatory Visit: Payer: Medicare Other

## 2016-11-12 NOTE — Progress Notes (Unsigned)
I received a call from choline's son [name]. He tells me he had a very hard time getting through it was very upset with the office. He wanted me to call him back but when I did call him back he told me this was not a good time to discuss his mother's situation. I gave them my beeper number and I also referred him to our practice manager regarding his complaints

## 2016-11-14 ENCOUNTER — Ambulatory Visit: Payer: Medicare Other | Attending: General Surgery | Admitting: Physical Therapy

## 2016-11-14 DIAGNOSIS — M545 Low back pain: Secondary | ICD-10-CM | POA: Insufficient documentation

## 2016-11-14 DIAGNOSIS — R600 Localized edema: Secondary | ICD-10-CM | POA: Insufficient documentation

## 2016-11-14 DIAGNOSIS — M25512 Pain in left shoulder: Secondary | ICD-10-CM | POA: Diagnosis not present

## 2016-11-14 DIAGNOSIS — R2689 Other abnormalities of gait and mobility: Secondary | ICD-10-CM | POA: Insufficient documentation

## 2016-11-14 NOTE — Therapy (Signed)
[place], [place], [address] Phone: [phone]   Fax:  [phone]  Physical Therapy Evaluation  Patient Details  Name: [name] MRN: [id number] Date of Birth: 10/22/1955 Referring Provider: Dr. [name]  Encounter Date: 11/14/2016      PT End of Session - 11/14/16 1245    Visit Number 1   Number of Visits 9   Date for PT Re-Evaluation 12/19/16   PT Start Time 1022   PT Stop Time 1110   PT Time Calculation (min) 48 min   Activity Tolerance Patient tolerated treatment well   Behavior During Therapy [facility] for tasks assessed/performed      Past Medical History:  Diagnosis Date  . Anxiety   . Asthma   . Cancer [facility])    Left breast  . Complication of anesthesia    pt states she is hard to wake up after anesthetic-"she was told this at [facility]."   . Constipation   . Constipation   . DDD (degenerative disc disease), lumbar   . Depression    situational  . Diabetes mellitus    Type II  . Dyspnea   . Family history of adverse reaction to anesthesia    Brother- N/V  . Fibromyalgia   . GERD (gastroesophageal reflux disease)   . Headache   . History of blood transfusion    with childbirth  . Hypertension   . Pneumonia 2008  . PONV (postoperative nausea and vomiting)   . Stroke [facility])    mini stroke 2008    Past Surgical History:  Procedure Laterality Date  . ABDOMINAL HYSTERECTOMY     Cervical- cancer cell  . ANTERIOR CERVICAL DISCECTOMY  2007  . BACK SURGERY  2009   Laminectomy  . Back surgery 4 yrs ago  2008  . BALLOON DILATION  10/02/2011   Procedure: BALLOON DILATION;  Surgeon: [name], MD;  [facility];  Service: Endoscopy;  Laterality: N/A;  . BREAST LUMPECTOMY WITH RADIOACTIVE SEED AND SENTINEL LYMPH NODE BIOPSY Left 10/13/2016   Procedure: BREAST LUMPECTOMY WITH RADIOACTIVE SEED AND SENTINEL LYMPH NODE BIOPSY;  Surgeon: [name], MD;  Location: [place];  Service: General;  Laterality: Left;  . CHOLECYSTECTOMY    . COLONOSCOPY WITH PROPOFOL N/A 04/18/2016   Procedure: COLONOSCOPY WITH PROPOFOL;  Surgeon: [name], MD;  [facility];  Service: Endoscopy;  Laterality: N/A;  955  . FOOT SURGERY     tried to remove bone spur- was unable- "just clean"  . MALONEY DILATION  10/02/2011   Procedure: MALONEY DILATION;  Surgeon: [name], MD;  [facility];  Service: Endoscopy;  Laterality: N/A;  . SAVORY DILATION  10/02/2011   Procedure: SAVORY DILATION;  Surgeon: [name], MD;  [facility];  Service: Endoscopy;  Laterality: N/A;    There were no vitals filed for this visit.       Subjective Assessment - 11/14/16 1027    Subjective "I think I'm doing pretty well with the arm part."  I'm not doing well with the walking; my legs are swollen and I have diabetic neuropathy. Saw Dr. Anselm Lis for a second opinion and so will not have chemo.  Saw Dr. Donne Hazel again and he thinks she should have some therapy.  Has had right leg swelling "always" and had back surgery in 2009 and has walked with a limp since then.  Has  to buy larger shoes to get her swollen feet in them.  She has a pair of compression stockings but doesn't wear them, in part because she needs help to get them on. Feels like it makes the upper legs "blow up" and then she hurts.  "I'm always tired and just feel drained." Was doing water aerobics and Nustep at the gym. Says she's having trouble focusing and with blood sugars up and down.   Pertinent History Patient was diagnosed on 08/28/16 with left grade 1 invasive ductal carcinoma breast cancer. It measures 9 mm and is located in the upper outer quadrant. It is ER/PR positive and HER2 negative with a Ki67 of 10%.  Had left lumpectomy with SLNB 10/12/16.  She will have XRT with sim on 11/17/16; will not have chemo. Diabetes and on medications, diabetic neuropathy; HTN  controlled with meds.  h/o back problems with surgery in 2009 for 3-5 disc fusion with a right CVA while hospitalized.  Vertigo.   Patient Stated Goals to feel better   Currently in Pain? Yes   Pain Score 5    Pain Location Breast   Pain Orientation Left;Upper;Lateral   Pain Descriptors / Indicators Discomfort   Aggravating Factors  anything pressing on it   Pain Relieving Factors supporting breast with pillow            [facility] PT Assessment - 11/14/16 0001      Assessment   Medical Diagnosis Left breast cancer   Referring Provider Dr. [name]   Onset Date/Surgical Date 10/13/16   Hand Dominance Left   Prior Therapy none     Precautions   Precaution Comments cancer precautions     Restrictions   Weight Bearing Restrictions No     Balance Screen   Has the patient fallen in the past 6 months No   Has the patient had a decrease in activity level because of a fear of falling?  No   Is the patient reluctant to leave their home because of a fear of falling?  No     Home Environment   Living Environment Private residence   Living Arrangements Alone  legally separted from disabled husband, who lives nearby   Type of [place] to enter   Entrance Stairs-Number of Steps 1     Prior Function   Level of [place] On disability   Vocation Requirements On disability due to back problems   Leisure [facility]es to the Y; hasn't been since the surgery and not since January     Cognition   Overall Cognitive Status Impaired/Different from baseline   Memory Impaired  she reports impairment     Observation/Other Assessments   Observations inferior and medical to breast incision, area of induration   Skin Integrity approx. 2 inch incision at upper outer breast healing well   Other Surveys  --  lymph life impact scale score 35=55% impaired (16/17 answers     Observation/Other Assessments-Edema    Edema --  leg circumference  measurements taken--see lymphedema     Posture/Postural Control   Posture/Postural Control Postural limitations   Postural Limitations Rounded Shoulders;Forward head     Ambulation/Gait   Ambulation/Gait Assistance 6: Modified independent (Device/Increase time)   Ambulation Distance (Feet) 80 Feet  then with mild-moderate dyspnea   Gait Comments limp: ? leg leg discrepancy or Trendelenberg  needs further testing           LYMPHEDEMA/ONCOLOGY  QUESTIONNAIRE - 11/14/16 1047      What other symptoms do you have   Are you having pitting edema Yes   Body Site legs, right > left     Lymphedema Assessments   Lymphedema Assessments Lower extremities     Right Upper Extremity Lymphedema   Other She says her leg swelling is down first thing in the moring, but increases quickly     Right Lower Extremity Lymphedema   20 cm Proximal to Suprapatella 62.7 cm   10 cm Proximal to Suprapatella 56.7 cm   At Midpatella/Popliteal Crease 37.8 cm   30 cm Proximal to Floor at Lateral Plantar Foot 44 cm   20 cm Proximal to Floor at Lateral Plantar Foot 34.'6 1   10 ' cm Proximal to Floor at Lateral Malleoli 27.8 cm   5 cm Proximal to 1st MTP Joint 27.3 cm   Across MTP Joint 25 cm   Around Proximal Great Toe 8.7 cm     Left Lower Extremity Lymphedema   20 cm Proximal to Suprapatella 62.3 cm   10 cm Proximal to Suprapatella 54.4 cm   At Midpatella/Popliteal Crease 39.4 cm   30 cm Proximal to Floor at Lateral Plantar Foot 45 cm   20 cm Proximal to Floor at Lateral Plantar Foot 37.9 cm   10 cm Proximal to Floor at Lateral Malleoli 27.6 cm   5 cm Proximal to 1st MTP Joint 27 cm   Across MTP Joint 24.4 cm   Around Proximal Great Toe 8 cm         Objective measurements completed on examination: See above findings.                  PT Education - 11/14/16 1244    Education provided Yes   Education Details somewhat briefly about need to wear compressin stockings and about  availability of donning butler to make that easier   Person(s) Educated Patient   Methods Explanation   Comprehension Verbalized understanding              Breast Clinic Goals - 10/01/16 1119      Patient will be able to verbalize understanding of pertinent lymphedema risk reduction practices relevant to her diagnosis specifically related to skin care.   Time 1   Period Days   Status Achieved     Patient will be able to return demonstrate and/or verbalize understanding of the post-op home exercise program related to regaining shoulder range of motion.   Time 1   Period Days   Status Achieved     Patient will be able to verbalize understanding of the importance of attending the postoperative After Breast Cancer Class for further lymphedema risk reduction education and therapeutic exercise.   Time 1   Period Days   Status Achieved          Long Term Clinic Goals - 11/14/16 1301      CC Long Term Goal  #1   Title Patient will be independent in a home exercise program for general conditioning including trunk, arms and legs (and endurance).   Time 4   Period Weeks   Status New     CC Long Term Goal  #2   Title Patient will be knowledgeable about community exercise program options including [employer] at the Y.   Time 4   Period Weeks   Status New     CC Long Term Goal  #3   Title Patient will know  how and where to obtain compression stockings and donning assist device.   Baseline 4   Period Weeks   Status New     CC Long Term Goal  #4   Title Patient will report at least 25% improvement in her overall feeling of wellness, including energy level.   Time 4   Period Weeks   Status New     CC Long Term Goal  #5   Title lymphedema life impacr scale score reduced to <40% impairment   Time 4   Period Weeks   Status New     CC Long Term Goal  #6   Title Pt. will be knowledgeable about self-care for/management of LE edema   Time 4   Period Weeks   Status New      Additional Goals   Additional Goals Yes             Plan - 11/14/16 1245    Clinical Impression Statement Patient returns today about two weeks after an evaluation just post-lumpectomy.  She seemed to be doing well at that time, just two weeks after her surgery.  Today she seems like a different person:  tired, with a lot of complaints, and seeking help (whereas last time she seemed happy to be told she could continue exercising on her own and didn't seem to need therapy).  She has abnormal gait, which she reports has been the case since low back surgery 9 years ago.  She has leg swelling, right > left, which she reports she has had for a long time.  She has compression knee-high stockings (which sound simply like TED hose), that she doesn't wear in part because she has difficulty donning them.  She did not know about the availability of donning assist devices; she is concerned about the cost of compression garments as she is on a fixed disability income.  She reports fatigue and that she used to go the gym but doesn't currently.  She became short of breath easily. She also has chronic low back pain and has had back surgery in 2009.  This is a re-eval.   Rehab Potential Good   Clinical Impairments Affecting Rehab Potential diabetes with neuropathy, h/o back surgery 2009   PT Frequency 2x / week   PT Duration 4 weeks   PT Treatment/Interventions ADLs/Self Care Home Management;DME Instruction;Gait training;Stair training;Therapeutic exercise;Balance training;Neuromuscular re-education;Patient/family education;Orthotic Fit/Training;Manual techniques;Manual lymph drainage;Compression bandaging;Passive range of motion;Taping   PT Next Visit Plan Recheck shoulder A/ROM.  Do 6 minute walk test.  Do LE MMT. Begin strengthening and conditioning exercise for trunk , UEs and LEs.  Advise to get compression stockings and a donning butler, and the importance of wearing these daily.  Work on normalizing gait.    Consulted and Agree with Plan of Care Patient      Patient will benefit from skilled therapeutic intervention in order to improve the following deficits and impairments:  Abnormal gait, Decreased endurance, Decreased knowledge of use of DME, Increased edema, Impaired sensation  Visit Diagnosis: Acute pain of left shoulder - Plan: PT plan of care cert/re-cert  Midline low back pain, unspecified chronicity, with sciatica presence unspecified - Plan: PT plan of care cert/re-cert  Other abnormalities of gait and mobility - Plan: PT plan of care cert/re-cert  Localized edema - Plan: PT plan of care cert/re-cert      G-Codes - 99/37/16 1310    Functional Assessment Tool Used (Outpatient Only) lymphedema life impact scale  Functional Limitation Self care   Self Care Current Status [phone]) At least 40 percent but less than 60 percent impaired, limited or restricted   Self Care Goal Status ([id number]) At least 20 percent but less than 40 percent impaired, limited or restricted       Problem List Patient Active Problem List   Diagnosis Date Noted  . Breast cancer, stage 1, left ([place]) 10/13/2016  . Malignant neoplasm of upper-outer quadrant of left breast in female, estrogen receptor positive ([place]) 09/30/2016  . Hx of colonic polyps 03/27/2016  . Dysphagia 09/03/2011  . Chest pain 01/02/2011  . Edema 01/02/2011  . WOUND INFECTION 10/20/2007  . [place] DISEASE 10/20/2007  . LAMINECTOMY, LUMBAR, HX OF 10/20/2007    Maxwel Meadowcroft 11/14/2016, 1:15 PM  [place], [place], [address] Phone: [phone]   Fax:  [phone]  Name: [name] MRN: [id number] Date of Birth: May 15, 1956  Serafina Royals, PT 11/14/16 1:15 PM

## 2016-11-17 ENCOUNTER — Ambulatory Visit
Admission: RE | Admit: 2016-11-17 | Discharge: 2016-11-17 | Disposition: A | Discharge status: Home / Self Care | Payer: Medicare Other | Source: Ambulatory Visit | Attending: Radiation Oncology | Admitting: Radiation Oncology

## 2016-11-17 ENCOUNTER — Encounter: Payer: Self-pay | Admitting: Radiation Oncology

## 2016-11-17 DIAGNOSIS — Z510 Encounter for antineoplastic radiation therapy: Secondary | ICD-10-CM | POA: Diagnosis not present

## 2016-11-17 DIAGNOSIS — Z170 Estrogen receptor positive status [ER+]: Principal | ICD-10-CM

## 2016-11-17 DIAGNOSIS — Z90710 Acquired absence of both cervix and uterus: Secondary | ICD-10-CM | POA: Diagnosis not present

## 2016-11-17 DIAGNOSIS — Z9889 Other specified postprocedural states: Secondary | ICD-10-CM | POA: Diagnosis not present

## 2016-11-17 DIAGNOSIS — Z7984 Long term (current) use of oral hypoglycemic drugs: Secondary | ICD-10-CM | POA: Diagnosis not present

## 2016-11-17 DIAGNOSIS — C50412 Malignant neoplasm of upper-outer quadrant of left female breast: Secondary | ICD-10-CM

## 2016-11-17 DIAGNOSIS — I10 Essential (primary) hypertension: Secondary | ICD-10-CM | POA: Diagnosis not present

## 2016-11-17 DIAGNOSIS — F329 Major depressive disorder, single episode, unspecified: Secondary | ICD-10-CM | POA: Diagnosis not present

## 2016-11-17 DIAGNOSIS — Z882 Allergy status to sulfonamides status: Secondary | ICD-10-CM | POA: Diagnosis not present

## 2016-11-17 DIAGNOSIS — Z8673 Personal history of transient ischemic attack (TIA), and cerebral infarction without residual deficits: Secondary | ICD-10-CM | POA: Diagnosis not present

## 2016-11-17 DIAGNOSIS — M797 Fibromyalgia: Secondary | ICD-10-CM | POA: Diagnosis not present

## 2016-11-17 DIAGNOSIS — Z79899 Other long term (current) drug therapy: Secondary | ICD-10-CM | POA: Diagnosis not present

## 2016-11-17 DIAGNOSIS — F419 Anxiety disorder, unspecified: Secondary | ICD-10-CM | POA: Diagnosis not present

## 2016-11-17 DIAGNOSIS — Z801 Family history of malignant neoplasm of trachea, bronchus and lung: Secondary | ICD-10-CM | POA: Diagnosis not present

## 2016-11-17 DIAGNOSIS — J45909 Unspecified asthma, uncomplicated: Secondary | ICD-10-CM | POA: Diagnosis not present

## 2016-11-17 DIAGNOSIS — D0512 Intraductal carcinoma in situ of left breast: Secondary | ICD-10-CM | POA: Diagnosis not present

## 2016-11-17 DIAGNOSIS — K219 Gastro-esophageal reflux disease without esophagitis: Secondary | ICD-10-CM | POA: Diagnosis not present

## 2016-11-17 DIAGNOSIS — Z885 Allergy status to narcotic agent status: Secondary | ICD-10-CM | POA: Diagnosis not present

## 2016-11-17 DIAGNOSIS — E1140 Type 2 diabetes mellitus with diabetic neuropathy, unspecified: Secondary | ICD-10-CM | POA: Diagnosis not present

## 2016-11-17 DIAGNOSIS — Z880 Allergy status to penicillin: Secondary | ICD-10-CM | POA: Diagnosis not present

## 2016-11-17 NOTE — Progress Notes (Signed)
Radiation Oncology         [phone] ________________________________  Re-Evaluation Consultation  Name: [name] MRN: [id number]  Date: 11/17/2016  DOB: 1956/03/10  MS:XJDBZ, Purcell Nails, MD  Magrinat, Virgie Dad, MD   REFERRING PHYSICIAN: Magrinat, Virgie Dad, MD  DIAGNOSIS:    ICD-9-CM ICD-10-CM   1. Malignant neoplasm of upper-outer quadrant of left breast in female, estrogen receptor positive ([place]) 174.4 C50.412    V86.0 Z17.0    Stage IA (pT1c, pN0) UOQ Left Breast Invasive Ductal Carcinoma, ER+ / PR+ / Her2-, Grade 1  CHIEF COMPLAINT: Here to discuss management of left breast cancer  HISTORY OF PRESENT ILLNESS::[name] is a 61 y.o. female who presented with a left breast mass and distortion on routine screening mammogram at [facility] on 08/28/16. Ultrasound on 09/16/16 showed a 9 x 6 x 7 mm spiculated mass at the 2 o'clock position, 7 cm from the nipple. Biopsy on 09/23/16 showed invasive and in situ mammary carcinoma with characteristics as described above in the diagnosis. Receptor status is ER 100%, PR 90%, HER2 -, and Ki67 10%.   The patient presented to breast clinic on 10/01/16. Since then, she had a left lumpectomy and sentinel lymph node biopsies on 10/13/16. Left lumpectomy revealed grade 1 invasive ductal carcinoma measuring 1.1 cm. The surgical resection margins were negative for carcinoma. Four biopsied left axillary sentinel lymph nodes were negative for carcinoma. pT1c, pN0 Oncotype DX testing performed on the pathology revealed a score of 25; intermediate risk with a 17% chance of distant recurrence in 10 years with Tamoxifen alone. The possible benefit from chemotherapy is in the 5% range and the patient declined adjuvant chemotherapy in light she already has neuropathy from diabetes and felt that her functional status is very marginal.  The patient presents today with her husband to further discuss radiation in the management of her disease.  PREVIOUS  RADIATION THERAPY: No  PAST MEDICAL HISTORY:  has a past medical history of Anxiety; Asthma; Cancer ([place]); Complication of anesthesia; Constipation; Constipation; DDD (degenerative disc disease), lumbar; Depression; Diabetes mellitus; Dyspnea; Family history of adverse reaction to anesthesia; Fibromyalgia; GERD (gastroesophageal reflux disease); Headache; History of blood transfusion; Hypertension; Pneumonia (2008); PONV (postoperative nausea and vomiting); and Stroke ([place]).    PAST SURGICAL HISTORY: Past Surgical History:  Procedure Laterality Date  . ABDOMINAL HYSTERECTOMY     Cervical- cancer cell  . ANTERIOR CERVICAL DISCECTOMY  2007  . BACK SURGERY  2009   Laminectomy  . Back surgery 4 yrs ago  2008  . BALLOON DILATION  10/02/2011   Procedure: BALLOON DILATION;  Surgeon: [name], MD;  [facility];  Service: Endoscopy;  Laterality: N/A;  . BREAST LUMPECTOMY WITH RADIOACTIVE SEED AND SENTINEL LYMPH NODE BIOPSY Left 10/13/2016   Procedure: BREAST LUMPECTOMY WITH RADIOACTIVE SEED AND SENTINEL LYMPH NODE BIOPSY;  Surgeon: [name], MD;  Location: [place];  Service: General;  Laterality: Left;  . CHOLECYSTECTOMY    . COLONOSCOPY WITH PROPOFOL N/A 04/18/2016   Procedure: COLONOSCOPY WITH PROPOFOL;  Surgeon: [name], MD;  [facility];  Service: Endoscopy;  Laterality: N/A;  955  . FOOT SURGERY     tried to remove bone spur- was unable- "just clean"  . MALONEY DILATION  10/02/2011   Procedure: MALONEY DILATION;  Surgeon: [name], MD;  [facility];  Service: Endoscopy;  Laterality: N/A;  . SAVORY DILATION  10/02/2011   Procedure: SAVORY DILATION;  Surgeon: [name], MD;  [facility];  Service: Endoscopy;  Laterality: N/A;    FAMILY HISTORY: family history includes Lung cancer in her father.  SOCIAL HISTORY:  reports that she has never smoked. She has never used smokeless tobacco. She reports that she drinks  alcohol. She reports that she does not use drugs.  ALLERGIES: Adhesive [tape]; Amoxicillin-pot clavulanate; Codeine; Penicillins; and Sulfonamide derivatives  MEDICATIONS:  Current Outpatient Prescriptions  Medication Sig Dispense Refill  . albuterol (PROVENTIL HFA;VENTOLIN HFA) 108 (90 BASE) MCG/ACT inhaler Inhale 2 puffs into the lungs every 6 (six) hours as needed for wheezing or shortness of breath.    . ALPRAZolam (XANAX) 1 MG tablet Take 1 mg by mouth 4 (four) times daily as needed.     Marland Kitchen atenolol (TENORMIN) 25 MG tablet Take 25 mg by mouth daily.     . bisacodyl (DULCOLAX) 5 MG EC tablet Take 10 mg by mouth daily as needed for moderate constipation.    . fluticasone (FLONASE) 50 MCG/ACT nasal spray Place 1 spray into both nostrils daily as needed for allergies or rhinitis.    Marland Kitchen glimepiride (AMARYL) 4 MG tablet Take 4 mg by mouth 2 (two) times daily.    Marland Kitchen lidocaine (LIDODERM) 5 % Place 1 patch onto the skin daily as needed (pain). Remove & Discard patch within 12 hours or as directed by MD     . losartan (COZAAR) 25 MG tablet Take 25 mg by mouth daily.    . metFORMIN (GLUCOPHAGE) 1000 MG tablet Take 1,000 mg by mouth 2 (two) times daily.     . methocarbamol (ROBAXIN) 500 MG tablet Take 1 tablet (500 mg total) by mouth every 6 (six) hours as needed for muscle spasms. 20 tablet 0  . montelukast (SINGULAIR) 10 MG tablet Take 10 mg by mouth daily as needed.    . Oxycodone HCl 10 MG TABS Take 10 mg by mouth every 6 (six) hours as needed (pain).     Marland Kitchen terbinafine (LAMISIL) 1 % cream Apply 1 application topically 2 (two) times daily. For feet     No current facility-administered medications for this encounter.     REVIEW OF SYSTEMS: A 10+ POINT REVIEW OF SYSTEMS WAS OBTAINED including neurology, dermatology, psychiatry, cardiac, respiratory, lymph, extremities, GI, GU, Musculoskeletal, constitutional, breasts, reproductive, HEENT. A complete review of systems is obtained and is otherwise  negative.  The patient reports mild pain in her left axilla  and she also has chronic back pain and neuropathy. She takes oxycodone as needed and uses a lidoderm patch.   PHYSICAL EXAM:  Vitals:   11/17/16 1324  BP: 118/86  Pulse: 73  Temp: 97.6 F (36.4 C)  TempSrc: Oral  SpO2: 97%  Weight: 225 lb 6.4 oz (102.2 kg)  Height: 5' 1.5" (1.562 m)   General: Alert and oriented, in no acute distress HEENT: Head is normocephalic. Extraocular movements are intact. Oropharynx is clear. Neck: Neck is supple, no palpable cervical or supraclavicular lymphadenopathy. Heart: Regular in rate and rhythm with no murmurs, rubs, or gallops. Chest: Clear to auscultation bilaterally, with no rhonchi, wheezes, or rales. Neurologic: Cranial nerves II through XII are grossly intact. No obvious focalities. Speech is fluent. Coordination is intact. MSK: The patient had some difficulty getting onto the exam table. Psychiatric: Judgment and insight are intact. Affect is appropriate. Breasts: Right breast no palpable mass or nipple discharge. Left breast has a scar in the UOQ, healed well with no sign of  drainage or infection, no separate axillary scar is appreciated.   LABORATORY DATA:  Lab Results  Component Value Date   WBC 7.9 10/01/2016   HGB 14.1 10/01/2016   HCT 41.2 10/01/2016   MCV 90.4 10/01/2016   PLT 188 10/01/2016   CMP     Component Value Date/Time   NA 139 10/01/2016 0806   K 4.2 10/01/2016 0806   CL 104 04/11/2016 1108   CO2 27 10/01/2016 0806   GLUCOSE 267 (H) 10/01/2016 0806   BUN 19.4 10/01/2016 0806   CREATININE 1.2 (H) 10/01/2016 0806   CALCIUM 9.9 10/01/2016 0806   PROT 6.9 10/01/2016 0806   ALBUMIN 3.8 10/01/2016 0806   AST 30 10/01/2016 0806   ALT 44 10/01/2016 0806   ALKPHOS 71 10/01/2016 0806   BILITOT 0.87 10/01/2016 0806   GFRNONAA >60 04/11/2016 1108   GFRAA >60 04/11/2016 1108         RADIOGRAPHY: No results found.    IMPRESSION/PLAN: Stage IA (pT1c, pN0)  UOQ Left Breast Invasive Ductal Carcinoma, ER+ / PR+ / Her2-, Grade 1  The patient was felt to be a good candidate for breast conservation therapy with lumpectomy and sentinel lymph node procedure and adjuvant radiation therapy. Oncotype DX testing showed some benefit with adjuvant chemotherapy to reduce the risk of recurrence. However, the patient was intimidated by the possible complications and toxicities of chemotherapy. Therefore she declined chemotherapy and Dr. Jana Hakim obliged  I spoke to the patient today regarding her diagnosis and options for treatment. We discussed the equivalence in terms of survival and local failure between mastectomy and breast conservation. We discussed the role of radiation in decreasing local failures in patients who undergo lumpectomy. We discussed the process of simulation and the placement tattoos. We discussed 4-6 weeks of treatment as an outpatient; depending on her setup. We discussed the possibility of asymptomatic lung damage. We discussed the low likelihood of secondary malignancies. We discussed the possible side effects including but not limited to skin redness, fatigue, permanent skin darkening, and breast swelling.  CT simulation is scheduled on 11/20/16 at 11AM. We anticipate beginning treatments the following week. __________________________________________ -----------------------------------  Blair Promise, PhD, MD  This document serves as a record of services personally performed by [name], MD. It was created on his behalf by Darcus Austin, a trained medical scribe. The creation of this record is based on the scribe's personal observations and the provider's statements to them. This document has been checked and approved by the attending provider.

## 2016-11-17 NOTE — Progress Notes (Signed)
Location of Breast Cancer: left breast  Histology per Pathology Report:   10/13/16 Diagnosis 1. 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 - THE SURGICAL RESECTION MARGINS ARE NEGATIVE FOR CARCINOMA. - SEE ONCOLOGY TABLE BELOW. 2. Lymph node, sentinel, biopsy, Left axillary - THERE IS NO EVIDENCE OF CARCINOMA IN 1 OF 1 LYMPH NODE (0/1). 3. Lymph node, sentinel, biopsy, Left axillary - THERE IS NO EVIDENCE OF CARCINOMA IN 1 OF 1 LYMPH NODE (0/1). 4. Lymph node, sentinel, biopsy, Left axillary - THERE IS NO EVIDENCE OF CARCINOMA IN 1 OF 1 LYMPH NODE (0/1). 5. Lymph node, sentinel, biopsy, Left axillary - THERE IS NO EVIDENCE OF CARCINOMA IN 1 OF 1 LYMPH NODE (0/1). 6. Breast, excision, Left inferior margin - FIBROCYSTIC CHANGES WITH CALCIFICATIONS. - THERE IS NO EVIDENCE OF MALIGNANCY. - SEE COMMENT. 7. Breast, excision, Left anterior margin - BENIGN BREAST PARENCHYMA. - THERE IS NO EVIDENCE OF MALIGNANCY. - SEE COMMENT.  09/23/16 Diagnosis Breast, left, needle core biopsy, upper outer quadrant - INVASIVE AND IN SITU MAMMARY CARCINOMA.  Receptor Status: ER(100%), PR (90%), Her2-neu (neg), Ki-(10%)  Did patient present with symptoms (if so, please note symptoms) or was this found on screening mammography?: screening mammogram  Past/Anticipated interventions by surgeon, if any: 10/13/16 - Procedure: BREAST LUMPECTOMY WITH RADIOACTIVE SEED AND SENTINEL LYMPH NODE BIOPSY;  Surgeon: [name], MD  Past/Anticipated interventions by medical oncology, if any: Oncotype DX of 25.  Patient has decided on radiation followed by anti-estrogen therapy  Lymphedema issues, if any:  no    Pain issues, if any:  yes has pain under her left arm for a seroma.  Also has chronic back pain and neuropathy. Takes oxycodone as needed and uses a Lidoderm patch.  OB Gyn history: Menarche age 88, first live birth age 21, the patient is [place] P2. She underwent  hysterectomy with unilateral salpingo-oophorectomy at age 63. She used estradiol for 5 years as hormone replacement until her diagnosis of breast cancer in April 2018.  SAFETY ISSUES:  Prior radiation? no  Pacemaker/ICD? no  Possible current pregnancy?no  Is the patient on methotrexate? no  Current Complaints / other details:  Patient is here with her ex-husband.  BP 118/86 (BP Location: Right Arm, Patient Position: Sitting)   Pulse 73   Temp 97.6 F (36.4 C) (Oral)   Ht 5' 1.5" (1.562 m)   Wt 225 lb 6.4 oz (102.2 kg)   SpO2 97%   BMI 41.90 kg/m   Wt Readings from Last 3 Encounters:  11/17/16 225 lb 6.4 oz (102.2 kg)  11/04/16 225 lb 1.6 oz (102.1 kg)  10/09/16 221 lb 12.8 oz (100.6 kg)      Jacqulyn Liner, RN 11/17/2016,1:32 PM

## 2016-11-17 NOTE — Progress Notes (Signed)
Please see the Nurse Progress Note in the MD Initial Consult Encounter for this patient. 

## 2016-11-19 ENCOUNTER — Ambulatory Visit: Payer: Medicare Other | Admitting: Physical Therapy

## 2016-11-19 ENCOUNTER — Encounter: Payer: Self-pay | Admitting: Physical Therapy

## 2016-11-19 DIAGNOSIS — R2689 Other abnormalities of gait and mobility: Secondary | ICD-10-CM | POA: Diagnosis not present

## 2016-11-19 DIAGNOSIS — M25512 Pain in left shoulder: Secondary | ICD-10-CM | POA: Diagnosis not present

## 2016-11-19 DIAGNOSIS — R600 Localized edema: Secondary | ICD-10-CM | POA: Diagnosis not present

## 2016-11-19 DIAGNOSIS — M545 Low back pain: Secondary | ICD-10-CM

## 2016-11-19 NOTE — Patient Instructions (Signed)

## 2016-11-19 NOTE — Therapy (Addendum)
[place], [place], [address] Phone: [phone]   Fax:  [phone]  Physical Therapy Treatment  Patient Details  Name: [name] MRN: [id number] Date of Birth: 1956/03/16 Referring Provider: Dr. [name]  Encounter Date: 11/19/2016      PT End of Session - 11/19/16 1153    Visit Number 2   Number of Visits 9   Date for PT Re-Evaluation 12/19/16   PT Start Time 1055   PT Stop Time 1150   PT Time Calculation (min) 55 min   Activity Tolerance Patient tolerated treatment well   Behavior During Therapy [facility] for tasks assessed/performed      Past Medical History:  Diagnosis Date  . Anxiety   . Asthma   . Cancer [facility])    Left breast  . Complication of anesthesia    pt states she is hard to wake up after anesthetic-"she was told this at [facility]."   . Constipation   . Constipation   . DDD (degenerative disc disease), lumbar   . Depression    situational  . Diabetes mellitus    Type II  . Dyspnea   . Family history of adverse reaction to anesthesia    Brother- N/V  . Fibromyalgia   . GERD (gastroesophageal reflux disease)   . Headache   . History of blood transfusion    with childbirth  . Hypertension   . Pneumonia 2008  . PONV (postoperative nausea and vomiting)   . Stroke [facility])    mini stroke 2008    Past Surgical History:  Procedure Laterality Date  . ABDOMINAL HYSTERECTOMY     Cervical- cancer cell  . ANTERIOR CERVICAL DISCECTOMY  2007  . BACK SURGERY  2009   Laminectomy  . Back surgery 4 yrs ago  2008  . BALLOON DILATION  10/02/2011   Procedure: BALLOON DILATION;  Surgeon: [name], MD;  [facility];  Service: Endoscopy;  Laterality: N/A;  . BREAST LUMPECTOMY WITH RADIOACTIVE SEED AND SENTINEL LYMPH NODE BIOPSY Left 10/13/2016   Procedure: BREAST LUMPECTOMY WITH RADIOACTIVE SEED AND SENTINEL LYMPH NODE BIOPSY;  Surgeon: [name], MD;  Location: [place];  Service: General;  Laterality: Left;  . CHOLECYSTECTOMY    . COLONOSCOPY WITH PROPOFOL N/A 04/18/2016   Procedure: COLONOSCOPY WITH PROPOFOL;  Surgeon: [name], MD;  [facility];  Service: Endoscopy;  Laterality: N/A;  955  . FOOT SURGERY     tried to remove bone spur- was unable- "just clean"  . MALONEY DILATION  10/02/2011   Procedure: MALONEY DILATION;  Surgeon: [name], MD;  [facility];  Service: Endoscopy;  Laterality: N/A;  . SAVORY DILATION  10/02/2011   Procedure: SAVORY DILATION;  Surgeon: [name], MD;  [facility];  Service: Endoscopy;  Laterality: N/A;    There were no vitals filed for this visit.      Subjective Assessment - 11/19/16 1058    Subjective I have radiation simulation tomorrow. I am doing much better today. I think my nerves got the best of me and my back flared up. My legs are down and I have been working really hard on my diet. I walked a half mile this morning before I came. I have been trying to walk a half mile everyday.    Pertinent History Patient was diagnosed on 08/28/16 with left grade 1 invasive ductal carcinoma breast  cancer. It measures 9 mm and is located in the upper outer quadrant. It is ER/PR positive and HER2 negative with a Ki67 of 10%.  Had left lumpectomy with SLNB 10/12/16.  She will have XRT with sim on 11/17/16; will not have chemo. Diabetes and on medications, diabetic neuropathy; HTN controlled with meds.  h/o back problems with surgery in 2009 for 3-5 disc fusion with a right CVA while hospitalized.  Vertigo.   Patient Stated Goals to feel better   Currently in Pain? Yes   Pain Score 4    Pain Location Back   Pain Orientation Mid   Pain Descriptors / Indicators Discomfort   Pain Type Surgical pain   Pain Onset More than a month ago   Pain Frequency Constant            [facility] PT Assessment - 11/19/16 0001      AROM   Right Shoulder Flexion 149  Degrees   Right Shoulder ABduction 155 Degrees   Right Shoulder Internal Rotation 70 Degrees   Right Shoulder External Rotation 90 Degrees   Left Shoulder Flexion 154 Degrees   Left Shoulder ABduction 150 Degrees   Left Shoulder Internal Rotation 72 Degrees   Left Shoulder External Rotation 72 Degrees     Strength   Overall Strength Within functional limits for tasks performed  for hips and knees, left ankle was much weaker   Strength Assessment Site Ankle   Right/Left Ankle Right;Left   Right Ankle Dorsiflexion 5/5   Left Ankle Dorsiflexion 3+/5     6 Minute Walk- Baseline   6 Minute Walk- Baseline yes  832.5 ft, pt stumbled 3x and required CGA for safety   Modified Borg Scale for Dyspnea 3- Moderate shortness of breath or breathing difficulty   Perceived Rate of Exertion (Borg) 13- Somewhat hard                     [facility] Adult PT Treatment/Exercise - 11/19/16 0001      Shoulder Exercises: Supine   Horizontal ABduction Strengthening;Both;10 reps;Theraband   Theraband Level (Shoulder Horizontal ABduction) Level 1 (Yellow)   External Rotation Strengthening;Both;10 reps;Theraband   Theraband Level (Shoulder External Rotation) Level 1 (Yellow)   Flexion Strengthening;Both;Theraband   Theraband Level (Shoulder Flexion) Level 1 (Yellow)  with narrow grip   Other Supine Exercises Meeks decompression exercises x 5 reps each with 3 sec holds                      Breast Clinic Goals - 10/01/16 1119      Patient will be able to verbalize understanding of pertinent lymphedema risk reduction practices relevant to her diagnosis specifically related to skin care.   Time 1   Period Days   Status Achieved     Patient will be able to return demonstrate and/or verbalize understanding of the post-op home exercise program related to regaining shoulder range of motion.   Time 1   Period Days   Status Achieved     Patient will be able to verbalize understanding  of the importance of attending the postoperative After Breast Cancer Class for further lymphedema risk reduction education and therapeutic exercise.   Time 1   Period Days   Status Achieved          Long Term Clinic Goals - 11/14/16 1301      CC Long Term Goal  #1   Title Patient will be independent in a  home exercise program for general conditioning including trunk, arms and legs (and endurance).   Time 4   Period Weeks   Status New     CC Long Term Goal  #2   Title Patient will be knowledgeable about community exercise program options [facility] [facility]   Time 4   Period Weeks   Status New     CC Long Term Goal  #3   Title Patient will know how and where to obtain compression stockings and donning assist device.   Baseline 4   Period Weeks   Status New     CC Long Term Goal  #4   Title Patient will report at least 25% improvement in her overall feeling of wellness, including energy level.   Time 4   Period Weeks   Status New     CC Long Term Goal  #5   Title lymphedema life impacr scale score reduced to <40% impairment   Time 4   Period Weeks   Status New     CC Long Term Goal  #6   Title Pt. will be knowledgeable about self-care for/management of LE edema   Time 4   Period Weeks   Status New     Additional Goals   Additional Goals Yes            Plan - 11/19/16 1209    Clinical Impression Statement Assessed pt's 6 min walk test today. She was able to walk 832 ft in 6 min and had 3 near falls where she stumbled. Pt ambulated with CGA due to stumbling. LE strengthening reveals left ankle weakness 3+/5. Hips and knees were grossly 5/5. Her bilateral shoulder ROM is limited in direction of flexion and left external rotation. Began today with Meeks decompression exercises to help with back pain and scapular stablization exercises. Encouraged pt to keep walking at home. Pt states she has been dieting lately and her swelling has been decreasing. She  does not want to pursue compression garments at this time because she think she can manage the swelling through lifestyle changes.    Rehab Potential Good   Clinical Impairments Affecting Rehab Potential diabetes with neuropathy, h/o back surgery 2009   PT Frequency 2x / week   PT Duration 4 weeks   PT Treatment/Interventions ADLs/Self Care Home Management;DME Instruction;Gait training;Stair training;Therapeutic exercise;Balance training;Neuromuscular re-education;Patient/family education;Orthotic Fit/Training;Manual techniques;Manual lymph drainage;Compression bandaging;Passive range of motion;Taping   PT Next Visit Plan issue scapular exercises as HEP, assess indep with Meeks, strengthening and conditioning exercise for trunk , UEs and LEs.   Work on normalizing gait.   PT Home Exercise Plan Post-op shoulder ROM HEP but with more focus on positioning for radiation treatment, Meeks   Consulted and Agree with Plan of Care Patient      Patient will benefit from skilled therapeutic intervention in order to improve the following deficits and impairments:  Abnormal gait, Decreased endurance, Decreased knowledge of use of DME, Increased edema, Impaired sensation  Visit Diagnosis: Acute pain of left shoulder  Midline low back pain, unspecified chronicity, with sciatica presence unspecified  Other abnormalities of gait and mobility     Problem List Patient Active Problem List   Diagnosis Date Noted  . Breast cancer, stage 1, left ([place]) 10/13/2016  . Malignant neoplasm of upper-outer quadrant of left breast in female, estrogen receptor positive ([place]) 09/30/2016  . Hx of colonic polyps 03/27/2016  . Dysphagia 09/03/2011  . Chest pain 01/02/2011  .  Edema 01/02/2011  . WOUND INFECTION 10/20/2007  . [place] DISEASE 10/20/2007  . LAMINECTOMY, LUMBAR, HX OF 10/20/2007    Allyson Sabal [facility] 11/19/2016, 12:15 PM  [place], [place], [address] Phone: [phone]   Fax:  [phone]  Name: [name] MRN: [id number] Date of Birth: 01-16-1956  Manus Gunning, PT 11/19/16 12:15 PM  PHYSICAL THERAPY DISCHARGE SUMMARY  Visits from Start of Care: 2  Current functional level related to goals / functional outcomes: See above   Remaining deficits: See above  Education / Equipment: See above Plan: Patient agrees to discharge.  Patient goals were not met. Patient is being discharged due to not returning since the last visit.  ?????     Allyson Sabal [place], [place] 07/13/17 10:16 AM

## 2016-11-20 ENCOUNTER — Encounter: Payer: Medicare Other | Admitting: Physical Therapy

## 2016-11-20 ENCOUNTER — Ambulatory Visit
Admission: RE | Admit: 2016-11-20 | Discharge: 2016-11-20 | Disposition: A | Discharge status: Home / Self Care | Payer: Medicare Other | Source: Ambulatory Visit | Attending: Radiation Oncology | Admitting: Radiation Oncology

## 2016-11-20 ENCOUNTER — Other Ambulatory Visit: Payer: Self-pay | Admitting: General Surgery

## 2016-11-20 DIAGNOSIS — Z79899 Other long term (current) drug therapy: Secondary | ICD-10-CM | POA: Diagnosis not present

## 2016-11-20 DIAGNOSIS — Z170 Estrogen receptor positive status [ER+]: Principal | ICD-10-CM

## 2016-11-20 DIAGNOSIS — J45909 Unspecified asthma, uncomplicated: Secondary | ICD-10-CM | POA: Diagnosis not present

## 2016-11-20 DIAGNOSIS — K219 Gastro-esophageal reflux disease without esophagitis: Secondary | ICD-10-CM | POA: Diagnosis not present

## 2016-11-20 DIAGNOSIS — Z801 Family history of malignant neoplasm of trachea, bronchus and lung: Secondary | ICD-10-CM | POA: Diagnosis not present

## 2016-11-20 DIAGNOSIS — C50412 Malignant neoplasm of upper-outer quadrant of left female breast: Secondary | ICD-10-CM

## 2016-11-20 DIAGNOSIS — Z885 Allergy status to narcotic agent status: Secondary | ICD-10-CM | POA: Diagnosis not present

## 2016-11-20 DIAGNOSIS — Z510 Encounter for antineoplastic radiation therapy: Secondary | ICD-10-CM | POA: Diagnosis not present

## 2016-11-20 DIAGNOSIS — R5381 Other malaise: Secondary | ICD-10-CM

## 2016-11-20 DIAGNOSIS — M797 Fibromyalgia: Secondary | ICD-10-CM | POA: Diagnosis not present

## 2016-11-20 DIAGNOSIS — Z8673 Personal history of transient ischemic attack (TIA), and cerebral infarction without residual deficits: Secondary | ICD-10-CM | POA: Diagnosis not present

## 2016-11-20 DIAGNOSIS — Z880 Allergy status to penicillin: Secondary | ICD-10-CM | POA: Diagnosis not present

## 2016-11-20 DIAGNOSIS — Z882 Allergy status to sulfonamides status: Secondary | ICD-10-CM | POA: Diagnosis not present

## 2016-11-20 DIAGNOSIS — Z7984 Long term (current) use of oral hypoglycemic drugs: Secondary | ICD-10-CM | POA: Diagnosis not present

## 2016-11-20 DIAGNOSIS — E1140 Type 2 diabetes mellitus with diabetic neuropathy, unspecified: Secondary | ICD-10-CM | POA: Diagnosis not present

## 2016-11-20 DIAGNOSIS — I10 Essential (primary) hypertension: Secondary | ICD-10-CM | POA: Diagnosis not present

## 2016-11-20 NOTE — Progress Notes (Signed)
  Radiation Oncology         [phone] ________________________________  Name: [name] MRN: [id number]  Date: 11/20/2016  DOB: 1956/02/28  SIMULATION AND TREATMENT PLANNING NOTE   DIAGNOSIS: Stage IA (pT1c, pN0) UOQ Left Breast Invasive Ductal Carcinoma, ER+ / PR+ / Her2-, Grade 1  NARRATIVE:  The patient was brought to the [place] suite.  Identity was confirmed.  All relevant records and images related to the planned course of therapy were reviewed.  The patient freely provided informed written consent to proceed with treatment after reviewing the details related to the planned course of therapy. The consent form was witnessed and verified by the simulation staff.  Then, the patient was set-up in a stable reproducible  supine position for radiation therapy.  CT images were obtained.  Surface markings were placed.  The CT images were loaded into the planning software.  Then the target and avoidance structures were contoured.  Treatment planning then occurred.  The radiation prescription was entered and confirmed.  Then, I designed and supervised the construction of a total of 3 medically necessary complex treatment devices.  I have requested : 3D Simulation  I have requested a DVH of the following structures: heart, lungs, and lumpectomy cavity.  I have ordered:dose calc.  PLAN:  The patient will receive 50.4 Gy in 28 fractions. The patient will then proceed with a boost to the lumpectomy cavity of 10 Gy for a cumulative dose of 60.4 Gy. Hypofractionated treatment will be attempted if technically possible.  -----------------------------------  Blair Promise, PhD, MD  This document serves as a record of services personally performed by [name], MD. It was created on his behalf by Bethann Humble, a trained medical scribe. The creation of this record is based on the scribe's personal observations and the provider's statements to them. This document has been checked and  approved by the attending provider.

## 2016-11-24 ENCOUNTER — Ambulatory Visit: Payer: Medicare Other

## 2016-11-24 ENCOUNTER — Other Ambulatory Visit: Payer: Self-pay | Admitting: Oncology

## 2016-11-26 DIAGNOSIS — Z885 Allergy status to narcotic agent status: Secondary | ICD-10-CM | POA: Diagnosis not present

## 2016-11-26 DIAGNOSIS — J45909 Unspecified asthma, uncomplicated: Secondary | ICD-10-CM | POA: Diagnosis not present

## 2016-11-26 DIAGNOSIS — Z7984 Long term (current) use of oral hypoglycemic drugs: Secondary | ICD-10-CM | POA: Diagnosis not present

## 2016-11-26 DIAGNOSIS — Z510 Encounter for antineoplastic radiation therapy: Secondary | ICD-10-CM | POA: Diagnosis not present

## 2016-11-26 DIAGNOSIS — Z79899 Other long term (current) drug therapy: Secondary | ICD-10-CM | POA: Diagnosis not present

## 2016-11-26 DIAGNOSIS — Z882 Allergy status to sulfonamides status: Secondary | ICD-10-CM | POA: Diagnosis not present

## 2016-11-26 DIAGNOSIS — I10 Essential (primary) hypertension: Secondary | ICD-10-CM | POA: Diagnosis not present

## 2016-11-26 DIAGNOSIS — M797 Fibromyalgia: Secondary | ICD-10-CM | POA: Diagnosis not present

## 2016-11-26 DIAGNOSIS — K219 Gastro-esophageal reflux disease without esophagitis: Secondary | ICD-10-CM | POA: Diagnosis not present

## 2016-11-26 DIAGNOSIS — Z8673 Personal history of transient ischemic attack (TIA), and cerebral infarction without residual deficits: Secondary | ICD-10-CM | POA: Diagnosis not present

## 2016-11-26 DIAGNOSIS — Z880 Allergy status to penicillin: Secondary | ICD-10-CM | POA: Diagnosis not present

## 2016-11-26 DIAGNOSIS — C50412 Malignant neoplasm of upper-outer quadrant of left female breast: Secondary | ICD-10-CM | POA: Diagnosis not present

## 2016-11-26 DIAGNOSIS — Z170 Estrogen receptor positive status [ER+]: Secondary | ICD-10-CM | POA: Diagnosis not present

## 2016-11-26 DIAGNOSIS — E1140 Type 2 diabetes mellitus with diabetic neuropathy, unspecified: Secondary | ICD-10-CM | POA: Diagnosis not present

## 2016-11-26 DIAGNOSIS — Z801 Family history of malignant neoplasm of trachea, bronchus and lung: Secondary | ICD-10-CM | POA: Diagnosis not present

## 2016-11-27 ENCOUNTER — Telehealth: Payer: Self-pay | Admitting: Nutrition

## 2016-11-27 NOTE — Telephone Encounter (Signed)
I contacted patient to schedule to nutrition appointment after receiving referral from Dr. Donne Hazel. Patient is a newly diagnosed breast cancer.  Contacted patient at number on her chart.  However, she was not available. Her voice mailbox was full and I was unable to leave a message.  **Disclaimer: This note was dictated with voice recognition software. Similar sounding words can inadvertently be transcribed and this note may contain transcription errors which may not have been corrected upon publication of note.**

## 2016-11-28 ENCOUNTER — Other Ambulatory Visit (HOSPITAL_COMMUNITY): Payer: Self-pay | Admitting: General Surgery

## 2016-11-28 ENCOUNTER — Other Ambulatory Visit: Payer: Self-pay | Admitting: General Surgery

## 2016-11-28 ENCOUNTER — Ambulatory Visit
Admission: RE | Admit: 2016-11-28 | Discharge: 2016-11-28 | Disposition: A | Discharge status: Home / Self Care | Payer: Medicare Other | Source: Ambulatory Visit | Attending: Radiation Oncology | Admitting: Radiation Oncology

## 2016-11-28 DIAGNOSIS — Z79899 Other long term (current) drug therapy: Secondary | ICD-10-CM | POA: Diagnosis not present

## 2016-11-28 DIAGNOSIS — K219 Gastro-esophageal reflux disease without esophagitis: Secondary | ICD-10-CM | POA: Diagnosis not present

## 2016-11-28 DIAGNOSIS — Z880 Allergy status to penicillin: Secondary | ICD-10-CM | POA: Diagnosis not present

## 2016-11-28 DIAGNOSIS — I10 Essential (primary) hypertension: Secondary | ICD-10-CM | POA: Diagnosis not present

## 2016-11-28 DIAGNOSIS — Z882 Allergy status to sulfonamides status: Secondary | ICD-10-CM | POA: Diagnosis not present

## 2016-11-28 DIAGNOSIS — M797 Fibromyalgia: Secondary | ICD-10-CM | POA: Diagnosis not present

## 2016-11-28 DIAGNOSIS — Z780 Asymptomatic menopausal state: Secondary | ICD-10-CM

## 2016-11-28 DIAGNOSIS — J45909 Unspecified asthma, uncomplicated: Secondary | ICD-10-CM | POA: Diagnosis not present

## 2016-11-28 DIAGNOSIS — Z801 Family history of malignant neoplasm of trachea, bronchus and lung: Secondary | ICD-10-CM | POA: Diagnosis not present

## 2016-11-28 DIAGNOSIS — E1140 Type 2 diabetes mellitus with diabetic neuropathy, unspecified: Secondary | ICD-10-CM | POA: Diagnosis not present

## 2016-11-28 DIAGNOSIS — Z170 Estrogen receptor positive status [ER+]: Secondary | ICD-10-CM | POA: Diagnosis not present

## 2016-11-28 DIAGNOSIS — Z7984 Long term (current) use of oral hypoglycemic drugs: Secondary | ICD-10-CM | POA: Diagnosis not present

## 2016-11-28 DIAGNOSIS — Z510 Encounter for antineoplastic radiation therapy: Secondary | ICD-10-CM | POA: Diagnosis not present

## 2016-11-28 DIAGNOSIS — Z8673 Personal history of transient ischemic attack (TIA), and cerebral infarction without residual deficits: Secondary | ICD-10-CM | POA: Diagnosis not present

## 2016-11-28 DIAGNOSIS — C50412 Malignant neoplasm of upper-outer quadrant of left female breast: Secondary | ICD-10-CM | POA: Diagnosis not present

## 2016-11-28 DIAGNOSIS — Z885 Allergy status to narcotic agent status: Secondary | ICD-10-CM | POA: Diagnosis not present

## 2016-12-01 ENCOUNTER — Encounter (HOSPITAL_COMMUNITY): Payer: Self-pay

## 2016-12-01 ENCOUNTER — Ambulatory Visit (HOSPITAL_COMMUNITY)
Admission: RE | Admit: 2016-12-01 | Discharge: 2016-12-01 | Disposition: A | Discharge status: Home / Self Care | Payer: Medicare Other | Source: Ambulatory Visit | Attending: General Surgery | Admitting: General Surgery

## 2016-12-01 ENCOUNTER — Ambulatory Visit
Admission: RE | Admit: 2016-12-01 | Discharge: 2016-12-01 | Disposition: A | Discharge status: Home / Self Care | Payer: Medicare Other | Source: Ambulatory Visit | Attending: Radiation Oncology | Admitting: Radiation Oncology

## 2016-12-01 ENCOUNTER — Other Ambulatory Visit (HOSPITAL_COMMUNITY): Payer: Medicare Other

## 2016-12-01 DIAGNOSIS — C50919 Malignant neoplasm of unspecified site of unspecified female breast: Secondary | ICD-10-CM | POA: Diagnosis not present

## 2016-12-01 DIAGNOSIS — J45909 Unspecified asthma, uncomplicated: Secondary | ICD-10-CM | POA: Diagnosis not present

## 2016-12-01 DIAGNOSIS — Z7984 Long term (current) use of oral hypoglycemic drugs: Secondary | ICD-10-CM | POA: Diagnosis not present

## 2016-12-01 DIAGNOSIS — Z170 Estrogen receptor positive status [ER+]: Secondary | ICD-10-CM | POA: Diagnosis not present

## 2016-12-01 DIAGNOSIS — Z780 Asymptomatic menopausal state: Secondary | ICD-10-CM | POA: Diagnosis not present

## 2016-12-01 DIAGNOSIS — Z882 Allergy status to sulfonamides status: Secondary | ICD-10-CM | POA: Diagnosis not present

## 2016-12-01 DIAGNOSIS — Z13820 Encounter for screening for osteoporosis: Secondary | ICD-10-CM | POA: Diagnosis not present

## 2016-12-01 DIAGNOSIS — Z885 Allergy status to narcotic agent status: Secondary | ICD-10-CM | POA: Diagnosis not present

## 2016-12-01 DIAGNOSIS — Z801 Family history of malignant neoplasm of trachea, bronchus and lung: Secondary | ICD-10-CM | POA: Diagnosis not present

## 2016-12-01 DIAGNOSIS — Z8673 Personal history of transient ischemic attack (TIA), and cerebral infarction without residual deficits: Secondary | ICD-10-CM | POA: Diagnosis not present

## 2016-12-01 DIAGNOSIS — Z880 Allergy status to penicillin: Secondary | ICD-10-CM | POA: Diagnosis not present

## 2016-12-01 DIAGNOSIS — C50412 Malignant neoplasm of upper-outer quadrant of left female breast: Secondary | ICD-10-CM | POA: Diagnosis not present

## 2016-12-01 DIAGNOSIS — Z510 Encounter for antineoplastic radiation therapy: Secondary | ICD-10-CM | POA: Diagnosis not present

## 2016-12-01 DIAGNOSIS — I10 Essential (primary) hypertension: Secondary | ICD-10-CM | POA: Diagnosis not present

## 2016-12-01 DIAGNOSIS — M797 Fibromyalgia: Secondary | ICD-10-CM | POA: Diagnosis not present

## 2016-12-01 DIAGNOSIS — E1140 Type 2 diabetes mellitus with diabetic neuropathy, unspecified: Secondary | ICD-10-CM | POA: Diagnosis not present

## 2016-12-01 DIAGNOSIS — Z79899 Other long term (current) drug therapy: Secondary | ICD-10-CM | POA: Diagnosis not present

## 2016-12-01 DIAGNOSIS — K219 Gastro-esophageal reflux disease without esophagitis: Secondary | ICD-10-CM | POA: Diagnosis not present

## 2016-12-02 ENCOUNTER — Ambulatory Visit
Admission: RE | Admit: 2016-12-02 | Discharge: 2016-12-02 | Disposition: A | Discharge status: Home / Self Care | Payer: Medicare Other | Source: Ambulatory Visit | Attending: Radiation Oncology | Admitting: Radiation Oncology

## 2016-12-02 DIAGNOSIS — Z8673 Personal history of transient ischemic attack (TIA), and cerebral infarction without residual deficits: Secondary | ICD-10-CM | POA: Diagnosis not present

## 2016-12-02 DIAGNOSIS — E1140 Type 2 diabetes mellitus with diabetic neuropathy, unspecified: Secondary | ICD-10-CM | POA: Diagnosis not present

## 2016-12-02 DIAGNOSIS — M797 Fibromyalgia: Secondary | ICD-10-CM | POA: Diagnosis not present

## 2016-12-02 DIAGNOSIS — Z880 Allergy status to penicillin: Secondary | ICD-10-CM | POA: Diagnosis not present

## 2016-12-02 DIAGNOSIS — Z170 Estrogen receptor positive status [ER+]: Secondary | ICD-10-CM | POA: Diagnosis not present

## 2016-12-02 DIAGNOSIS — Z510 Encounter for antineoplastic radiation therapy: Secondary | ICD-10-CM | POA: Diagnosis not present

## 2016-12-02 DIAGNOSIS — Z79899 Other long term (current) drug therapy: Secondary | ICD-10-CM | POA: Diagnosis not present

## 2016-12-02 DIAGNOSIS — Z885 Allergy status to narcotic agent status: Secondary | ICD-10-CM | POA: Diagnosis not present

## 2016-12-02 DIAGNOSIS — I10 Essential (primary) hypertension: Secondary | ICD-10-CM | POA: Diagnosis not present

## 2016-12-02 DIAGNOSIS — C50412 Malignant neoplasm of upper-outer quadrant of left female breast: Secondary | ICD-10-CM | POA: Diagnosis not present

## 2016-12-02 DIAGNOSIS — J45909 Unspecified asthma, uncomplicated: Secondary | ICD-10-CM | POA: Diagnosis not present

## 2016-12-02 DIAGNOSIS — Z801 Family history of malignant neoplasm of trachea, bronchus and lung: Secondary | ICD-10-CM | POA: Diagnosis not present

## 2016-12-02 DIAGNOSIS — K219 Gastro-esophageal reflux disease without esophagitis: Secondary | ICD-10-CM | POA: Diagnosis not present

## 2016-12-02 DIAGNOSIS — Z882 Allergy status to sulfonamides status: Secondary | ICD-10-CM | POA: Diagnosis not present

## 2016-12-02 DIAGNOSIS — Z7984 Long term (current) use of oral hypoglycemic drugs: Secondary | ICD-10-CM | POA: Diagnosis not present

## 2016-12-03 ENCOUNTER — Ambulatory Visit
Admission: RE | Admit: 2016-12-03 | Discharge: 2016-12-03 | Disposition: A | Discharge status: Home / Self Care | Payer: Medicare Other | Source: Ambulatory Visit | Attending: Radiation Oncology | Admitting: Radiation Oncology

## 2016-12-03 DIAGNOSIS — K219 Gastro-esophageal reflux disease without esophagitis: Secondary | ICD-10-CM | POA: Diagnosis not present

## 2016-12-03 DIAGNOSIS — Z882 Allergy status to sulfonamides status: Secondary | ICD-10-CM | POA: Diagnosis not present

## 2016-12-03 DIAGNOSIS — M797 Fibromyalgia: Secondary | ICD-10-CM | POA: Diagnosis not present

## 2016-12-03 DIAGNOSIS — Z510 Encounter for antineoplastic radiation therapy: Secondary | ICD-10-CM | POA: Diagnosis not present

## 2016-12-03 DIAGNOSIS — I10 Essential (primary) hypertension: Secondary | ICD-10-CM | POA: Diagnosis not present

## 2016-12-03 DIAGNOSIS — Z170 Estrogen receptor positive status [ER+]: Secondary | ICD-10-CM | POA: Diagnosis not present

## 2016-12-03 DIAGNOSIS — E1140 Type 2 diabetes mellitus with diabetic neuropathy, unspecified: Secondary | ICD-10-CM | POA: Diagnosis not present

## 2016-12-03 DIAGNOSIS — J45909 Unspecified asthma, uncomplicated: Secondary | ICD-10-CM | POA: Diagnosis not present

## 2016-12-03 DIAGNOSIS — Z7984 Long term (current) use of oral hypoglycemic drugs: Secondary | ICD-10-CM | POA: Diagnosis not present

## 2016-12-03 DIAGNOSIS — Z79899 Other long term (current) drug therapy: Secondary | ICD-10-CM | POA: Diagnosis not present

## 2016-12-03 DIAGNOSIS — Z880 Allergy status to penicillin: Secondary | ICD-10-CM | POA: Diagnosis not present

## 2016-12-03 DIAGNOSIS — C50412 Malignant neoplasm of upper-outer quadrant of left female breast: Secondary | ICD-10-CM | POA: Diagnosis not present

## 2016-12-03 DIAGNOSIS — Z885 Allergy status to narcotic agent status: Secondary | ICD-10-CM | POA: Diagnosis not present

## 2016-12-03 DIAGNOSIS — Z8673 Personal history of transient ischemic attack (TIA), and cerebral infarction without residual deficits: Secondary | ICD-10-CM | POA: Diagnosis not present

## 2016-12-03 DIAGNOSIS — Z801 Family history of malignant neoplasm of trachea, bronchus and lung: Secondary | ICD-10-CM | POA: Diagnosis not present

## 2016-12-04 ENCOUNTER — Ambulatory Visit
Admission: RE | Admit: 2016-12-04 | Discharge: 2016-12-04 | Disposition: A | Discharge status: Home / Self Care | Payer: Medicare Other | Source: Ambulatory Visit | Attending: Radiation Oncology | Admitting: Radiation Oncology

## 2016-12-04 DIAGNOSIS — Z8673 Personal history of transient ischemic attack (TIA), and cerebral infarction without residual deficits: Secondary | ICD-10-CM | POA: Diagnosis not present

## 2016-12-04 DIAGNOSIS — Z7984 Long term (current) use of oral hypoglycemic drugs: Secondary | ICD-10-CM | POA: Diagnosis not present

## 2016-12-04 DIAGNOSIS — Z880 Allergy status to penicillin: Secondary | ICD-10-CM | POA: Diagnosis not present

## 2016-12-04 DIAGNOSIS — Z801 Family history of malignant neoplasm of trachea, bronchus and lung: Secondary | ICD-10-CM | POA: Diagnosis not present

## 2016-12-04 DIAGNOSIS — J45909 Unspecified asthma, uncomplicated: Secondary | ICD-10-CM | POA: Diagnosis not present

## 2016-12-04 DIAGNOSIS — K219 Gastro-esophageal reflux disease without esophagitis: Secondary | ICD-10-CM | POA: Diagnosis not present

## 2016-12-04 DIAGNOSIS — I10 Essential (primary) hypertension: Secondary | ICD-10-CM | POA: Diagnosis not present

## 2016-12-04 DIAGNOSIS — Z882 Allergy status to sulfonamides status: Secondary | ICD-10-CM | POA: Diagnosis not present

## 2016-12-04 DIAGNOSIS — E1140 Type 2 diabetes mellitus with diabetic neuropathy, unspecified: Secondary | ICD-10-CM | POA: Diagnosis not present

## 2016-12-04 DIAGNOSIS — M797 Fibromyalgia: Secondary | ICD-10-CM | POA: Diagnosis not present

## 2016-12-04 DIAGNOSIS — Z510 Encounter for antineoplastic radiation therapy: Secondary | ICD-10-CM | POA: Diagnosis not present

## 2016-12-04 DIAGNOSIS — Z885 Allergy status to narcotic agent status: Secondary | ICD-10-CM | POA: Diagnosis not present

## 2016-12-04 DIAGNOSIS — C50412 Malignant neoplasm of upper-outer quadrant of left female breast: Secondary | ICD-10-CM | POA: Diagnosis not present

## 2016-12-04 DIAGNOSIS — Z79899 Other long term (current) drug therapy: Secondary | ICD-10-CM | POA: Diagnosis not present

## 2016-12-04 DIAGNOSIS — Z170 Estrogen receptor positive status [ER+]: Secondary | ICD-10-CM | POA: Diagnosis not present

## 2016-12-05 ENCOUNTER — Ambulatory Visit
Admission: RE | Admit: 2016-12-05 | Discharge: 2016-12-05 | Disposition: A | Discharge status: Home / Self Care | Payer: Medicare Other | Source: Ambulatory Visit | Attending: Radiation Oncology | Admitting: Radiation Oncology

## 2016-12-05 DIAGNOSIS — Z885 Allergy status to narcotic agent status: Secondary | ICD-10-CM | POA: Diagnosis not present

## 2016-12-05 DIAGNOSIS — Z7984 Long term (current) use of oral hypoglycemic drugs: Secondary | ICD-10-CM | POA: Diagnosis not present

## 2016-12-05 DIAGNOSIS — Z801 Family history of malignant neoplasm of trachea, bronchus and lung: Secondary | ICD-10-CM | POA: Diagnosis not present

## 2016-12-05 DIAGNOSIS — C50412 Malignant neoplasm of upper-outer quadrant of left female breast: Secondary | ICD-10-CM

## 2016-12-05 DIAGNOSIS — M797 Fibromyalgia: Secondary | ICD-10-CM | POA: Diagnosis not present

## 2016-12-05 DIAGNOSIS — J45909 Unspecified asthma, uncomplicated: Secondary | ICD-10-CM | POA: Diagnosis not present

## 2016-12-05 DIAGNOSIS — Z79899 Other long term (current) drug therapy: Secondary | ICD-10-CM | POA: Diagnosis not present

## 2016-12-05 DIAGNOSIS — Z510 Encounter for antineoplastic radiation therapy: Secondary | ICD-10-CM | POA: Diagnosis not present

## 2016-12-05 DIAGNOSIS — E1140 Type 2 diabetes mellitus with diabetic neuropathy, unspecified: Secondary | ICD-10-CM | POA: Diagnosis not present

## 2016-12-05 DIAGNOSIS — Z170 Estrogen receptor positive status [ER+]: Secondary | ICD-10-CM | POA: Diagnosis not present

## 2016-12-05 DIAGNOSIS — Z880 Allergy status to penicillin: Secondary | ICD-10-CM | POA: Diagnosis not present

## 2016-12-05 DIAGNOSIS — Z882 Allergy status to sulfonamides status: Secondary | ICD-10-CM | POA: Diagnosis not present

## 2016-12-05 DIAGNOSIS — K219 Gastro-esophageal reflux disease without esophagitis: Secondary | ICD-10-CM | POA: Diagnosis not present

## 2016-12-05 DIAGNOSIS — Z8673 Personal history of transient ischemic attack (TIA), and cerebral infarction without residual deficits: Secondary | ICD-10-CM | POA: Diagnosis not present

## 2016-12-05 DIAGNOSIS — I10 Essential (primary) hypertension: Secondary | ICD-10-CM | POA: Diagnosis not present

## 2016-12-05 MED ORDER — ALRA NON-METALLIC DEODORANT (RAD-ONC)
1.0000 "application " | Freq: Once | TOPICAL | Status: AC
  Administered 2016-12-05: 1 via TOPICAL

## 2016-12-05 MED ORDER — RADIAPLEXRX EX GEL
Freq: Once | CUTANEOUS | Status: AC
  Administered 2016-12-05: 13:00:00 via TOPICAL

## 2016-12-05 NOTE — Progress Notes (Signed)
Pt here for patient teaching.  Pt given Radiation and You booklet, skin care instructions, Alra deodorant and Radiaplex gel.  Reviewed areas of pertinence such as fatigue, skin changes, breast tenderness and breast swelling . Pt able to give teach back of to pat skin,apply Radiaplex bid, avoid applying anything to skin within 4 hours of treatment and to use an electric razor if they must shave. Pt demonstrated understanding and verbalizes understanding of information given and will contact nursing with any questions or concerns.    Patient was also given antifungal powder and was advised to apply it twice a day (and to wipe off before treatment).

## 2016-12-08 ENCOUNTER — Ambulatory Visit
Admission: RE | Admit: 2016-12-08 | Discharge: 2016-12-08 | Disposition: A | Discharge status: Home / Self Care | Payer: Medicare Other | Source: Ambulatory Visit | Attending: Radiation Oncology | Admitting: Radiation Oncology

## 2016-12-08 ENCOUNTER — Ambulatory Visit: Payer: Medicare Other | Admitting: Nutrition

## 2016-12-08 DIAGNOSIS — Z880 Allergy status to penicillin: Secondary | ICD-10-CM | POA: Diagnosis not present

## 2016-12-08 DIAGNOSIS — Z885 Allergy status to narcotic agent status: Secondary | ICD-10-CM | POA: Diagnosis not present

## 2016-12-08 DIAGNOSIS — J45909 Unspecified asthma, uncomplicated: Secondary | ICD-10-CM | POA: Diagnosis not present

## 2016-12-08 DIAGNOSIS — Z882 Allergy status to sulfonamides status: Secondary | ICD-10-CM | POA: Diagnosis not present

## 2016-12-08 DIAGNOSIS — Z79899 Other long term (current) drug therapy: Secondary | ICD-10-CM | POA: Diagnosis not present

## 2016-12-08 DIAGNOSIS — Z801 Family history of malignant neoplasm of trachea, bronchus and lung: Secondary | ICD-10-CM | POA: Diagnosis not present

## 2016-12-08 DIAGNOSIS — I10 Essential (primary) hypertension: Secondary | ICD-10-CM | POA: Diagnosis not present

## 2016-12-08 DIAGNOSIS — Z8673 Personal history of transient ischemic attack (TIA), and cerebral infarction without residual deficits: Secondary | ICD-10-CM | POA: Diagnosis not present

## 2016-12-08 DIAGNOSIS — Z170 Estrogen receptor positive status [ER+]: Secondary | ICD-10-CM | POA: Diagnosis not present

## 2016-12-08 DIAGNOSIS — Z510 Encounter for antineoplastic radiation therapy: Secondary | ICD-10-CM | POA: Diagnosis not present

## 2016-12-08 DIAGNOSIS — C50412 Malignant neoplasm of upper-outer quadrant of left female breast: Secondary | ICD-10-CM | POA: Diagnosis not present

## 2016-12-08 DIAGNOSIS — Z7984 Long term (current) use of oral hypoglycemic drugs: Secondary | ICD-10-CM | POA: Diagnosis not present

## 2016-12-08 DIAGNOSIS — K219 Gastro-esophageal reflux disease without esophagitis: Secondary | ICD-10-CM | POA: Diagnosis not present

## 2016-12-08 DIAGNOSIS — M797 Fibromyalgia: Secondary | ICD-10-CM | POA: Diagnosis not present

## 2016-12-08 DIAGNOSIS — E1140 Type 2 diabetes mellitus with diabetic neuropathy, unspecified: Secondary | ICD-10-CM | POA: Diagnosis not present

## 2016-12-08 NOTE — Progress Notes (Signed)
61 year old female diagnosed with stage IA breast cancer.  Past medical history includes diabetes, anxiety, constipation, DDD, depression, GERD, and stroke.  Medications include Xanax, Amaryl and metformin.  Labs include glucose 152-263.   Height: 5 feet 1-1/2 inches. Weight: 225.4 pounds on June 4. Usual body weight: 228 pounds October 2017. BMI: 41.9. (obese)  Patient reports she has been educated on diabetic diet in the past. She is trying not to eat a lot of carbohydrates but is confused about what foods have carbohydrates in them. Reports CBG this morning 119. Reports she is afraid to eat sometimes.  Nutrition diagnosis:  Food and nutrition related knowledge deficit related to breast cancer and diabetes as evidenced by patient's self-report of knowledge deficit.  Intervention: Educated patient on the importance of regular meals consisting of protein, fat, and carbohydrate. Reviewed carbohydrate containing foods and explained importance of portion sizes and limiting total carbohydrates at mealtimes to 45-60 g Encouraged blood sugars below 180 during treatment for breast cancer. Provided fact sheets and answered questions.  Teach back method used.  Patient should be referred to nutrition and diabetes education services, for further diabetic education.  Nutrition diagnosis resolved.  **Disclaimer: This note was dictated with voice recognition software. Similar sounding words can inadvertently be transcribed and this note may contain transcription errors which may not have been corrected upon publication of note.**

## 2016-12-09 ENCOUNTER — Ambulatory Visit
Admission: RE | Admit: 2016-12-09 | Discharge: 2016-12-09 | Disposition: A | Discharge status: Home / Self Care | Payer: Medicare Other | Source: Ambulatory Visit | Attending: Radiation Oncology | Admitting: Radiation Oncology

## 2016-12-09 DIAGNOSIS — Z510 Encounter for antineoplastic radiation therapy: Secondary | ICD-10-CM | POA: Diagnosis not present

## 2016-12-09 DIAGNOSIS — Z882 Allergy status to sulfonamides status: Secondary | ICD-10-CM | POA: Diagnosis not present

## 2016-12-09 DIAGNOSIS — J45909 Unspecified asthma, uncomplicated: Secondary | ICD-10-CM | POA: Diagnosis not present

## 2016-12-09 DIAGNOSIS — K219 Gastro-esophageal reflux disease without esophagitis: Secondary | ICD-10-CM | POA: Diagnosis not present

## 2016-12-09 DIAGNOSIS — Z8673 Personal history of transient ischemic attack (TIA), and cerebral infarction without residual deficits: Secondary | ICD-10-CM | POA: Diagnosis not present

## 2016-12-09 DIAGNOSIS — Z170 Estrogen receptor positive status [ER+]: Secondary | ICD-10-CM | POA: Diagnosis not present

## 2016-12-09 DIAGNOSIS — Z7984 Long term (current) use of oral hypoglycemic drugs: Secondary | ICD-10-CM | POA: Diagnosis not present

## 2016-12-09 DIAGNOSIS — M797 Fibromyalgia: Secondary | ICD-10-CM | POA: Diagnosis not present

## 2016-12-09 DIAGNOSIS — Z885 Allergy status to narcotic agent status: Secondary | ICD-10-CM | POA: Diagnosis not present

## 2016-12-09 DIAGNOSIS — I10 Essential (primary) hypertension: Secondary | ICD-10-CM | POA: Diagnosis not present

## 2016-12-09 DIAGNOSIS — Z79899 Other long term (current) drug therapy: Secondary | ICD-10-CM | POA: Diagnosis not present

## 2016-12-09 DIAGNOSIS — Z801 Family history of malignant neoplasm of trachea, bronchus and lung: Secondary | ICD-10-CM | POA: Diagnosis not present

## 2016-12-09 DIAGNOSIS — Z880 Allergy status to penicillin: Secondary | ICD-10-CM | POA: Diagnosis not present

## 2016-12-09 DIAGNOSIS — E1140 Type 2 diabetes mellitus with diabetic neuropathy, unspecified: Secondary | ICD-10-CM | POA: Diagnosis not present

## 2016-12-09 DIAGNOSIS — C50412 Malignant neoplasm of upper-outer quadrant of left female breast: Secondary | ICD-10-CM | POA: Diagnosis not present

## 2016-12-10 ENCOUNTER — Ambulatory Visit
Admission: RE | Admit: 2016-12-10 | Discharge: 2016-12-10 | Disposition: A | Discharge status: Home / Self Care | Payer: Medicare Other | Source: Ambulatory Visit | Attending: Radiation Oncology | Admitting: Radiation Oncology

## 2016-12-10 DIAGNOSIS — C50412 Malignant neoplasm of upper-outer quadrant of left female breast: Secondary | ICD-10-CM | POA: Diagnosis not present

## 2016-12-10 DIAGNOSIS — E1140 Type 2 diabetes mellitus with diabetic neuropathy, unspecified: Secondary | ICD-10-CM | POA: Diagnosis not present

## 2016-12-10 DIAGNOSIS — Z882 Allergy status to sulfonamides status: Secondary | ICD-10-CM | POA: Diagnosis not present

## 2016-12-10 DIAGNOSIS — Z170 Estrogen receptor positive status [ER+]: Secondary | ICD-10-CM | POA: Diagnosis not present

## 2016-12-10 DIAGNOSIS — Z880 Allergy status to penicillin: Secondary | ICD-10-CM | POA: Diagnosis not present

## 2016-12-10 DIAGNOSIS — Z79899 Other long term (current) drug therapy: Secondary | ICD-10-CM | POA: Diagnosis not present

## 2016-12-10 DIAGNOSIS — I10 Essential (primary) hypertension: Secondary | ICD-10-CM | POA: Diagnosis not present

## 2016-12-10 DIAGNOSIS — Z8673 Personal history of transient ischemic attack (TIA), and cerebral infarction without residual deficits: Secondary | ICD-10-CM | POA: Diagnosis not present

## 2016-12-10 DIAGNOSIS — Z7984 Long term (current) use of oral hypoglycemic drugs: Secondary | ICD-10-CM | POA: Diagnosis not present

## 2016-12-10 DIAGNOSIS — K219 Gastro-esophageal reflux disease without esophagitis: Secondary | ICD-10-CM | POA: Diagnosis not present

## 2016-12-10 DIAGNOSIS — Z885 Allergy status to narcotic agent status: Secondary | ICD-10-CM | POA: Diagnosis not present

## 2016-12-10 DIAGNOSIS — Z510 Encounter for antineoplastic radiation therapy: Secondary | ICD-10-CM | POA: Diagnosis not present

## 2016-12-10 DIAGNOSIS — J45909 Unspecified asthma, uncomplicated: Secondary | ICD-10-CM | POA: Diagnosis not present

## 2016-12-10 DIAGNOSIS — Z801 Family history of malignant neoplasm of trachea, bronchus and lung: Secondary | ICD-10-CM | POA: Diagnosis not present

## 2016-12-10 DIAGNOSIS — M797 Fibromyalgia: Secondary | ICD-10-CM | POA: Diagnosis not present

## 2016-12-11 ENCOUNTER — Ambulatory Visit
Admission: RE | Admit: 2016-12-11 | Discharge: 2016-12-11 | Disposition: A | Discharge status: Home / Self Care | Payer: Medicare Other | Source: Ambulatory Visit | Attending: Radiation Oncology | Admitting: Radiation Oncology

## 2016-12-11 DIAGNOSIS — Z885 Allergy status to narcotic agent status: Secondary | ICD-10-CM | POA: Diagnosis not present

## 2016-12-11 DIAGNOSIS — Z170 Estrogen receptor positive status [ER+]: Secondary | ICD-10-CM | POA: Diagnosis not present

## 2016-12-11 DIAGNOSIS — K219 Gastro-esophageal reflux disease without esophagitis: Secondary | ICD-10-CM | POA: Diagnosis not present

## 2016-12-11 DIAGNOSIS — C50412 Malignant neoplasm of upper-outer quadrant of left female breast: Secondary | ICD-10-CM | POA: Diagnosis not present

## 2016-12-11 DIAGNOSIS — I10 Essential (primary) hypertension: Secondary | ICD-10-CM | POA: Diagnosis not present

## 2016-12-11 DIAGNOSIS — Z510 Encounter for antineoplastic radiation therapy: Secondary | ICD-10-CM | POA: Diagnosis not present

## 2016-12-11 DIAGNOSIS — E1140 Type 2 diabetes mellitus with diabetic neuropathy, unspecified: Secondary | ICD-10-CM | POA: Diagnosis not present

## 2016-12-11 DIAGNOSIS — Z880 Allergy status to penicillin: Secondary | ICD-10-CM | POA: Diagnosis not present

## 2016-12-11 DIAGNOSIS — J45909 Unspecified asthma, uncomplicated: Secondary | ICD-10-CM | POA: Diagnosis not present

## 2016-12-11 DIAGNOSIS — Z801 Family history of malignant neoplasm of trachea, bronchus and lung: Secondary | ICD-10-CM | POA: Diagnosis not present

## 2016-12-11 DIAGNOSIS — Z882 Allergy status to sulfonamides status: Secondary | ICD-10-CM | POA: Diagnosis not present

## 2016-12-11 DIAGNOSIS — Z79899 Other long term (current) drug therapy: Secondary | ICD-10-CM | POA: Diagnosis not present

## 2016-12-11 DIAGNOSIS — M797 Fibromyalgia: Secondary | ICD-10-CM | POA: Diagnosis not present

## 2016-12-11 DIAGNOSIS — Z7984 Long term (current) use of oral hypoglycemic drugs: Secondary | ICD-10-CM | POA: Diagnosis not present

## 2016-12-11 DIAGNOSIS — Z8673 Personal history of transient ischemic attack (TIA), and cerebral infarction without residual deficits: Secondary | ICD-10-CM | POA: Diagnosis not present

## 2016-12-12 ENCOUNTER — Ambulatory Visit
Admission: RE | Admit: 2016-12-12 | Discharge: 2016-12-12 | Disposition: A | Discharge status: Home / Self Care | Payer: Medicare Other | Source: Ambulatory Visit | Attending: Radiation Oncology | Admitting: Radiation Oncology

## 2016-12-12 DIAGNOSIS — G894 Chronic pain syndrome: Secondary | ICD-10-CM | POA: Diagnosis not present

## 2016-12-12 DIAGNOSIS — Z801 Family history of malignant neoplasm of trachea, bronchus and lung: Secondary | ICD-10-CM | POA: Diagnosis not present

## 2016-12-12 DIAGNOSIS — K219 Gastro-esophageal reflux disease without esophagitis: Secondary | ICD-10-CM | POA: Diagnosis not present

## 2016-12-12 DIAGNOSIS — C50412 Malignant neoplasm of upper-outer quadrant of left female breast: Secondary | ICD-10-CM | POA: Diagnosis not present

## 2016-12-12 DIAGNOSIS — Z0001 Encounter for general adult medical examination with abnormal findings: Secondary | ICD-10-CM | POA: Diagnosis not present

## 2016-12-12 DIAGNOSIS — Z885 Allergy status to narcotic agent status: Secondary | ICD-10-CM | POA: Diagnosis not present

## 2016-12-12 DIAGNOSIS — Z1389 Encounter for screening for other disorder: Secondary | ICD-10-CM | POA: Diagnosis not present

## 2016-12-12 DIAGNOSIS — Z510 Encounter for antineoplastic radiation therapy: Secondary | ICD-10-CM | POA: Diagnosis not present

## 2016-12-12 DIAGNOSIS — Z7984 Long term (current) use of oral hypoglycemic drugs: Secondary | ICD-10-CM | POA: Diagnosis not present

## 2016-12-12 DIAGNOSIS — Z79899 Other long term (current) drug therapy: Secondary | ICD-10-CM | POA: Diagnosis not present

## 2016-12-12 DIAGNOSIS — Z880 Allergy status to penicillin: Secondary | ICD-10-CM | POA: Diagnosis not present

## 2016-12-12 DIAGNOSIS — J45909 Unspecified asthma, uncomplicated: Secondary | ICD-10-CM | POA: Diagnosis not present

## 2016-12-12 DIAGNOSIS — Z170 Estrogen receptor positive status [ER+]: Secondary | ICD-10-CM | POA: Diagnosis not present

## 2016-12-12 DIAGNOSIS — Z882 Allergy status to sulfonamides status: Secondary | ICD-10-CM | POA: Diagnosis not present

## 2016-12-12 DIAGNOSIS — B372 Candidiasis of skin and nail: Secondary | ICD-10-CM | POA: Diagnosis not present

## 2016-12-12 DIAGNOSIS — E1140 Type 2 diabetes mellitus with diabetic neuropathy, unspecified: Secondary | ICD-10-CM | POA: Diagnosis not present

## 2016-12-12 DIAGNOSIS — Z8673 Personal history of transient ischemic attack (TIA), and cerebral infarction without residual deficits: Secondary | ICD-10-CM | POA: Diagnosis not present

## 2016-12-12 DIAGNOSIS — I10 Essential (primary) hypertension: Secondary | ICD-10-CM | POA: Diagnosis not present

## 2016-12-12 DIAGNOSIS — M797 Fibromyalgia: Secondary | ICD-10-CM | POA: Diagnosis not present

## 2016-12-15 ENCOUNTER — Ambulatory Visit
Admission: RE | Admit: 2016-12-15 | Discharge: 2016-12-15 | Disposition: A | Discharge status: Home / Self Care | Payer: Medicare Other | Source: Ambulatory Visit | Attending: Radiation Oncology | Admitting: Radiation Oncology

## 2016-12-15 DIAGNOSIS — C50412 Malignant neoplasm of upper-outer quadrant of left female breast: Secondary | ICD-10-CM | POA: Diagnosis not present

## 2016-12-15 DIAGNOSIS — K219 Gastro-esophageal reflux disease without esophagitis: Secondary | ICD-10-CM | POA: Diagnosis not present

## 2016-12-15 DIAGNOSIS — Z170 Estrogen receptor positive status [ER+]: Secondary | ICD-10-CM | POA: Diagnosis not present

## 2016-12-15 DIAGNOSIS — M797 Fibromyalgia: Secondary | ICD-10-CM | POA: Diagnosis not present

## 2016-12-15 DIAGNOSIS — Z510 Encounter for antineoplastic radiation therapy: Secondary | ICD-10-CM | POA: Diagnosis not present

## 2016-12-15 DIAGNOSIS — Z801 Family history of malignant neoplasm of trachea, bronchus and lung: Secondary | ICD-10-CM | POA: Diagnosis not present

## 2016-12-15 DIAGNOSIS — J45909 Unspecified asthma, uncomplicated: Secondary | ICD-10-CM | POA: Diagnosis not present

## 2016-12-15 DIAGNOSIS — E1140 Type 2 diabetes mellitus with diabetic neuropathy, unspecified: Secondary | ICD-10-CM | POA: Diagnosis not present

## 2016-12-15 DIAGNOSIS — Z8673 Personal history of transient ischemic attack (TIA), and cerebral infarction without residual deficits: Secondary | ICD-10-CM | POA: Diagnosis not present

## 2016-12-15 DIAGNOSIS — Z7984 Long term (current) use of oral hypoglycemic drugs: Secondary | ICD-10-CM | POA: Diagnosis not present

## 2016-12-15 DIAGNOSIS — Z880 Allergy status to penicillin: Secondary | ICD-10-CM | POA: Diagnosis not present

## 2016-12-15 DIAGNOSIS — Z882 Allergy status to sulfonamides status: Secondary | ICD-10-CM | POA: Diagnosis not present

## 2016-12-15 DIAGNOSIS — Z79899 Other long term (current) drug therapy: Secondary | ICD-10-CM | POA: Diagnosis not present

## 2016-12-15 DIAGNOSIS — Z885 Allergy status to narcotic agent status: Secondary | ICD-10-CM | POA: Diagnosis not present

## 2016-12-15 DIAGNOSIS — I10 Essential (primary) hypertension: Secondary | ICD-10-CM | POA: Diagnosis not present

## 2016-12-16 ENCOUNTER — Ambulatory Visit
Admission: RE | Admit: 2016-12-16 | Discharge: 2016-12-16 | Disposition: A | Discharge status: Home / Self Care | Payer: Medicare Other | Source: Ambulatory Visit | Attending: Radiation Oncology | Admitting: Radiation Oncology

## 2016-12-16 DIAGNOSIS — Z79899 Other long term (current) drug therapy: Secondary | ICD-10-CM | POA: Diagnosis not present

## 2016-12-16 DIAGNOSIS — Z880 Allergy status to penicillin: Secondary | ICD-10-CM | POA: Diagnosis not present

## 2016-12-16 DIAGNOSIS — K219 Gastro-esophageal reflux disease without esophagitis: Secondary | ICD-10-CM | POA: Diagnosis not present

## 2016-12-16 DIAGNOSIS — E1140 Type 2 diabetes mellitus with diabetic neuropathy, unspecified: Secondary | ICD-10-CM | POA: Diagnosis not present

## 2016-12-16 DIAGNOSIS — Z801 Family history of malignant neoplasm of trachea, bronchus and lung: Secondary | ICD-10-CM | POA: Diagnosis not present

## 2016-12-16 DIAGNOSIS — Z885 Allergy status to narcotic agent status: Secondary | ICD-10-CM | POA: Diagnosis not present

## 2016-12-16 DIAGNOSIS — Z170 Estrogen receptor positive status [ER+]: Secondary | ICD-10-CM | POA: Diagnosis not present

## 2016-12-16 DIAGNOSIS — M797 Fibromyalgia: Secondary | ICD-10-CM | POA: Diagnosis not present

## 2016-12-16 DIAGNOSIS — I10 Essential (primary) hypertension: Secondary | ICD-10-CM | POA: Diagnosis not present

## 2016-12-16 DIAGNOSIS — C50412 Malignant neoplasm of upper-outer quadrant of left female breast: Secondary | ICD-10-CM | POA: Diagnosis not present

## 2016-12-16 DIAGNOSIS — Z8673 Personal history of transient ischemic attack (TIA), and cerebral infarction without residual deficits: Secondary | ICD-10-CM | POA: Diagnosis not present

## 2016-12-16 DIAGNOSIS — J45909 Unspecified asthma, uncomplicated: Secondary | ICD-10-CM | POA: Diagnosis not present

## 2016-12-16 DIAGNOSIS — Z7984 Long term (current) use of oral hypoglycemic drugs: Secondary | ICD-10-CM | POA: Diagnosis not present

## 2016-12-16 DIAGNOSIS — Z882 Allergy status to sulfonamides status: Secondary | ICD-10-CM | POA: Diagnosis not present

## 2016-12-16 DIAGNOSIS — Z510 Encounter for antineoplastic radiation therapy: Secondary | ICD-10-CM | POA: Diagnosis not present

## 2016-12-18 ENCOUNTER — Ambulatory Visit
Admission: RE | Admit: 2016-12-18 | Discharge: 2016-12-18 | Disposition: A | Discharge status: Home / Self Care | Payer: Medicare Other | Source: Ambulatory Visit | Attending: Radiation Oncology | Admitting: Radiation Oncology

## 2016-12-18 DIAGNOSIS — Z880 Allergy status to penicillin: Secondary | ICD-10-CM | POA: Diagnosis not present

## 2016-12-18 DIAGNOSIS — Z79899 Other long term (current) drug therapy: Secondary | ICD-10-CM | POA: Diagnosis not present

## 2016-12-18 DIAGNOSIS — C50412 Malignant neoplasm of upper-outer quadrant of left female breast: Secondary | ICD-10-CM | POA: Diagnosis not present

## 2016-12-18 DIAGNOSIS — Z170 Estrogen receptor positive status [ER+]: Secondary | ICD-10-CM | POA: Diagnosis not present

## 2016-12-18 DIAGNOSIS — K219 Gastro-esophageal reflux disease without esophagitis: Secondary | ICD-10-CM | POA: Diagnosis not present

## 2016-12-18 DIAGNOSIS — Z510 Encounter for antineoplastic radiation therapy: Secondary | ICD-10-CM | POA: Diagnosis not present

## 2016-12-18 DIAGNOSIS — I10 Essential (primary) hypertension: Secondary | ICD-10-CM | POA: Diagnosis not present

## 2016-12-18 DIAGNOSIS — Z7984 Long term (current) use of oral hypoglycemic drugs: Secondary | ICD-10-CM | POA: Diagnosis not present

## 2016-12-18 DIAGNOSIS — E1140 Type 2 diabetes mellitus with diabetic neuropathy, unspecified: Secondary | ICD-10-CM | POA: Diagnosis not present

## 2016-12-18 DIAGNOSIS — M797 Fibromyalgia: Secondary | ICD-10-CM | POA: Diagnosis not present

## 2016-12-18 DIAGNOSIS — J45909 Unspecified asthma, uncomplicated: Secondary | ICD-10-CM | POA: Diagnosis not present

## 2016-12-18 DIAGNOSIS — Z8673 Personal history of transient ischemic attack (TIA), and cerebral infarction without residual deficits: Secondary | ICD-10-CM | POA: Diagnosis not present

## 2016-12-18 DIAGNOSIS — Z885 Allergy status to narcotic agent status: Secondary | ICD-10-CM | POA: Diagnosis not present

## 2016-12-18 DIAGNOSIS — Z882 Allergy status to sulfonamides status: Secondary | ICD-10-CM | POA: Diagnosis not present

## 2016-12-18 DIAGNOSIS — Z801 Family history of malignant neoplasm of trachea, bronchus and lung: Secondary | ICD-10-CM | POA: Diagnosis not present

## 2016-12-19 ENCOUNTER — Ambulatory Visit
Admission: RE | Admit: 2016-12-19 | Discharge: 2016-12-19 | Disposition: A | Discharge status: Home / Self Care | Payer: Medicare Other | Source: Ambulatory Visit | Attending: Radiation Oncology | Admitting: Radiation Oncology

## 2016-12-19 DIAGNOSIS — Z510 Encounter for antineoplastic radiation therapy: Secondary | ICD-10-CM | POA: Diagnosis not present

## 2016-12-19 DIAGNOSIS — K219 Gastro-esophageal reflux disease without esophagitis: Secondary | ICD-10-CM | POA: Diagnosis not present

## 2016-12-19 DIAGNOSIS — Z801 Family history of malignant neoplasm of trachea, bronchus and lung: Secondary | ICD-10-CM | POA: Diagnosis not present

## 2016-12-19 DIAGNOSIS — Z882 Allergy status to sulfonamides status: Secondary | ICD-10-CM | POA: Diagnosis not present

## 2016-12-19 DIAGNOSIS — M797 Fibromyalgia: Secondary | ICD-10-CM | POA: Diagnosis not present

## 2016-12-19 DIAGNOSIS — Z79899 Other long term (current) drug therapy: Secondary | ICD-10-CM | POA: Diagnosis not present

## 2016-12-19 DIAGNOSIS — E1140 Type 2 diabetes mellitus with diabetic neuropathy, unspecified: Secondary | ICD-10-CM | POA: Diagnosis not present

## 2016-12-19 DIAGNOSIS — Z170 Estrogen receptor positive status [ER+]: Secondary | ICD-10-CM | POA: Diagnosis not present

## 2016-12-19 DIAGNOSIS — C50412 Malignant neoplasm of upper-outer quadrant of left female breast: Secondary | ICD-10-CM | POA: Diagnosis not present

## 2016-12-19 DIAGNOSIS — Z7984 Long term (current) use of oral hypoglycemic drugs: Secondary | ICD-10-CM | POA: Diagnosis not present

## 2016-12-19 DIAGNOSIS — J45909 Unspecified asthma, uncomplicated: Secondary | ICD-10-CM | POA: Diagnosis not present

## 2016-12-19 DIAGNOSIS — I10 Essential (primary) hypertension: Secondary | ICD-10-CM | POA: Diagnosis not present

## 2016-12-19 DIAGNOSIS — Z885 Allergy status to narcotic agent status: Secondary | ICD-10-CM | POA: Diagnosis not present

## 2016-12-19 DIAGNOSIS — Z8673 Personal history of transient ischemic attack (TIA), and cerebral infarction without residual deficits: Secondary | ICD-10-CM | POA: Diagnosis not present

## 2016-12-19 DIAGNOSIS — Z880 Allergy status to penicillin: Secondary | ICD-10-CM | POA: Diagnosis not present

## 2016-12-22 ENCOUNTER — Ambulatory Visit
Admission: RE | Admit: 2016-12-22 | Discharge: 2016-12-22 | Disposition: A | Discharge status: Home / Self Care | Payer: Medicare Other | Source: Ambulatory Visit | Attending: Radiation Oncology | Admitting: Radiation Oncology

## 2016-12-22 DIAGNOSIS — K219 Gastro-esophageal reflux disease without esophagitis: Secondary | ICD-10-CM | POA: Diagnosis not present

## 2016-12-22 DIAGNOSIS — C50412 Malignant neoplasm of upper-outer quadrant of left female breast: Secondary | ICD-10-CM | POA: Diagnosis not present

## 2016-12-22 DIAGNOSIS — Z885 Allergy status to narcotic agent status: Secondary | ICD-10-CM | POA: Diagnosis not present

## 2016-12-22 DIAGNOSIS — J45909 Unspecified asthma, uncomplicated: Secondary | ICD-10-CM | POA: Diagnosis not present

## 2016-12-22 DIAGNOSIS — Z170 Estrogen receptor positive status [ER+]: Secondary | ICD-10-CM | POA: Diagnosis not present

## 2016-12-22 DIAGNOSIS — Z510 Encounter for antineoplastic radiation therapy: Secondary | ICD-10-CM | POA: Diagnosis not present

## 2016-12-22 DIAGNOSIS — Z882 Allergy status to sulfonamides status: Secondary | ICD-10-CM | POA: Diagnosis not present

## 2016-12-22 DIAGNOSIS — E1140 Type 2 diabetes mellitus with diabetic neuropathy, unspecified: Secondary | ICD-10-CM | POA: Diagnosis not present

## 2016-12-22 DIAGNOSIS — Z880 Allergy status to penicillin: Secondary | ICD-10-CM | POA: Diagnosis not present

## 2016-12-22 DIAGNOSIS — I10 Essential (primary) hypertension: Secondary | ICD-10-CM | POA: Diagnosis not present

## 2016-12-22 DIAGNOSIS — Z8673 Personal history of transient ischemic attack (TIA), and cerebral infarction without residual deficits: Secondary | ICD-10-CM | POA: Diagnosis not present

## 2016-12-22 DIAGNOSIS — Z801 Family history of malignant neoplasm of trachea, bronchus and lung: Secondary | ICD-10-CM | POA: Diagnosis not present

## 2016-12-22 DIAGNOSIS — Z79899 Other long term (current) drug therapy: Secondary | ICD-10-CM | POA: Diagnosis not present

## 2016-12-22 DIAGNOSIS — M797 Fibromyalgia: Secondary | ICD-10-CM | POA: Diagnosis not present

## 2016-12-22 DIAGNOSIS — Z7984 Long term (current) use of oral hypoglycemic drugs: Secondary | ICD-10-CM | POA: Diagnosis not present

## 2016-12-23 ENCOUNTER — Ambulatory Visit
Admission: RE | Admit: 2016-12-23 | Discharge: 2016-12-23 | Disposition: A | Discharge status: Home / Self Care | Payer: Medicare Other | Source: Ambulatory Visit | Attending: Radiation Oncology | Admitting: Radiation Oncology

## 2016-12-23 DIAGNOSIS — C50412 Malignant neoplasm of upper-outer quadrant of left female breast: Secondary | ICD-10-CM | POA: Diagnosis not present

## 2016-12-23 DIAGNOSIS — Z79899 Other long term (current) drug therapy: Secondary | ICD-10-CM | POA: Diagnosis not present

## 2016-12-23 DIAGNOSIS — Z885 Allergy status to narcotic agent status: Secondary | ICD-10-CM | POA: Diagnosis not present

## 2016-12-23 DIAGNOSIS — E1140 Type 2 diabetes mellitus with diabetic neuropathy, unspecified: Secondary | ICD-10-CM | POA: Diagnosis not present

## 2016-12-23 DIAGNOSIS — Z170 Estrogen receptor positive status [ER+]: Principal | ICD-10-CM

## 2016-12-23 DIAGNOSIS — Z801 Family history of malignant neoplasm of trachea, bronchus and lung: Secondary | ICD-10-CM | POA: Diagnosis not present

## 2016-12-23 DIAGNOSIS — Z8673 Personal history of transient ischemic attack (TIA), and cerebral infarction without residual deficits: Secondary | ICD-10-CM | POA: Diagnosis not present

## 2016-12-23 DIAGNOSIS — Z510 Encounter for antineoplastic radiation therapy: Secondary | ICD-10-CM | POA: Diagnosis not present

## 2016-12-23 DIAGNOSIS — K219 Gastro-esophageal reflux disease without esophagitis: Secondary | ICD-10-CM | POA: Diagnosis not present

## 2016-12-23 DIAGNOSIS — J45909 Unspecified asthma, uncomplicated: Secondary | ICD-10-CM | POA: Diagnosis not present

## 2016-12-23 DIAGNOSIS — Z7984 Long term (current) use of oral hypoglycemic drugs: Secondary | ICD-10-CM | POA: Diagnosis not present

## 2016-12-23 DIAGNOSIS — Z880 Allergy status to penicillin: Secondary | ICD-10-CM | POA: Diagnosis not present

## 2016-12-23 DIAGNOSIS — I10 Essential (primary) hypertension: Secondary | ICD-10-CM | POA: Diagnosis not present

## 2016-12-23 DIAGNOSIS — Z882 Allergy status to sulfonamides status: Secondary | ICD-10-CM | POA: Diagnosis not present

## 2016-12-23 DIAGNOSIS — M797 Fibromyalgia: Secondary | ICD-10-CM | POA: Diagnosis not present

## 2016-12-23 MED ORDER — SONAFINE EX EMUL
1.0000 "application " | Freq: Once | CUTANEOUS | Status: AC
  Administered 2016-12-23: 1 via TOPICAL

## 2016-12-24 ENCOUNTER — Ambulatory Visit
Admission: RE | Admit: 2016-12-24 | Discharge: 2016-12-24 | Disposition: A | Discharge status: Home / Self Care | Payer: Medicare Other | Source: Ambulatory Visit | Attending: Radiation Oncology | Admitting: Radiation Oncology

## 2016-12-24 DIAGNOSIS — Z8673 Personal history of transient ischemic attack (TIA), and cerebral infarction without residual deficits: Secondary | ICD-10-CM | POA: Diagnosis not present

## 2016-12-24 DIAGNOSIS — Z801 Family history of malignant neoplasm of trachea, bronchus and lung: Secondary | ICD-10-CM | POA: Diagnosis not present

## 2016-12-24 DIAGNOSIS — J45909 Unspecified asthma, uncomplicated: Secondary | ICD-10-CM | POA: Diagnosis not present

## 2016-12-24 DIAGNOSIS — K219 Gastro-esophageal reflux disease without esophagitis: Secondary | ICD-10-CM | POA: Diagnosis not present

## 2016-12-24 DIAGNOSIS — M797 Fibromyalgia: Secondary | ICD-10-CM | POA: Diagnosis not present

## 2016-12-24 DIAGNOSIS — Z885 Allergy status to narcotic agent status: Secondary | ICD-10-CM | POA: Diagnosis not present

## 2016-12-24 DIAGNOSIS — Z79899 Other long term (current) drug therapy: Secondary | ICD-10-CM | POA: Diagnosis not present

## 2016-12-24 DIAGNOSIS — Z170 Estrogen receptor positive status [ER+]: Secondary | ICD-10-CM | POA: Diagnosis not present

## 2016-12-24 DIAGNOSIS — C50412 Malignant neoplasm of upper-outer quadrant of left female breast: Secondary | ICD-10-CM | POA: Diagnosis not present

## 2016-12-24 DIAGNOSIS — Z7984 Long term (current) use of oral hypoglycemic drugs: Secondary | ICD-10-CM | POA: Diagnosis not present

## 2016-12-24 DIAGNOSIS — Z880 Allergy status to penicillin: Secondary | ICD-10-CM | POA: Diagnosis not present

## 2016-12-24 DIAGNOSIS — Z882 Allergy status to sulfonamides status: Secondary | ICD-10-CM | POA: Diagnosis not present

## 2016-12-24 DIAGNOSIS — Z510 Encounter for antineoplastic radiation therapy: Secondary | ICD-10-CM | POA: Diagnosis not present

## 2016-12-24 DIAGNOSIS — I10 Essential (primary) hypertension: Secondary | ICD-10-CM | POA: Diagnosis not present

## 2016-12-24 DIAGNOSIS — E1140 Type 2 diabetes mellitus with diabetic neuropathy, unspecified: Secondary | ICD-10-CM | POA: Diagnosis not present

## 2016-12-25 ENCOUNTER — Ambulatory Visit
Admission: RE | Admit: 2016-12-25 | Discharge: 2016-12-25 | Disposition: A | Discharge status: Home / Self Care | Payer: Medicare Other | Source: Ambulatory Visit | Attending: Radiation Oncology | Admitting: Radiation Oncology

## 2016-12-25 DIAGNOSIS — Z885 Allergy status to narcotic agent status: Secondary | ICD-10-CM | POA: Diagnosis not present

## 2016-12-25 DIAGNOSIS — Z7984 Long term (current) use of oral hypoglycemic drugs: Secondary | ICD-10-CM | POA: Diagnosis not present

## 2016-12-25 DIAGNOSIS — Z880 Allergy status to penicillin: Secondary | ICD-10-CM | POA: Diagnosis not present

## 2016-12-25 DIAGNOSIS — Z8673 Personal history of transient ischemic attack (TIA), and cerebral infarction without residual deficits: Secondary | ICD-10-CM | POA: Diagnosis not present

## 2016-12-25 DIAGNOSIS — K219 Gastro-esophageal reflux disease without esophagitis: Secondary | ICD-10-CM | POA: Diagnosis not present

## 2016-12-25 DIAGNOSIS — Z79899 Other long term (current) drug therapy: Secondary | ICD-10-CM | POA: Diagnosis not present

## 2016-12-25 DIAGNOSIS — M797 Fibromyalgia: Secondary | ICD-10-CM | POA: Diagnosis not present

## 2016-12-25 DIAGNOSIS — Z801 Family history of malignant neoplasm of trachea, bronchus and lung: Secondary | ICD-10-CM | POA: Diagnosis not present

## 2016-12-25 DIAGNOSIS — J45909 Unspecified asthma, uncomplicated: Secondary | ICD-10-CM | POA: Diagnosis not present

## 2016-12-25 DIAGNOSIS — I10 Essential (primary) hypertension: Secondary | ICD-10-CM | POA: Diagnosis not present

## 2016-12-25 DIAGNOSIS — Z882 Allergy status to sulfonamides status: Secondary | ICD-10-CM | POA: Diagnosis not present

## 2016-12-25 DIAGNOSIS — Z510 Encounter for antineoplastic radiation therapy: Secondary | ICD-10-CM | POA: Diagnosis not present

## 2016-12-25 DIAGNOSIS — E1140 Type 2 diabetes mellitus with diabetic neuropathy, unspecified: Secondary | ICD-10-CM | POA: Diagnosis not present

## 2016-12-25 DIAGNOSIS — Z170 Estrogen receptor positive status [ER+]: Secondary | ICD-10-CM | POA: Diagnosis not present

## 2016-12-25 DIAGNOSIS — C50412 Malignant neoplasm of upper-outer quadrant of left female breast: Secondary | ICD-10-CM | POA: Diagnosis not present

## 2016-12-26 ENCOUNTER — Ambulatory Visit
Admission: RE | Admit: 2016-12-26 | Discharge: 2016-12-26 | Disposition: A | Discharge status: Home / Self Care | Payer: Medicare Other | Source: Ambulatory Visit | Attending: Radiation Oncology | Admitting: Radiation Oncology

## 2016-12-26 DIAGNOSIS — Z880 Allergy status to penicillin: Secondary | ICD-10-CM | POA: Diagnosis not present

## 2016-12-26 DIAGNOSIS — Z885 Allergy status to narcotic agent status: Secondary | ICD-10-CM | POA: Diagnosis not present

## 2016-12-26 DIAGNOSIS — I10 Essential (primary) hypertension: Secondary | ICD-10-CM | POA: Diagnosis not present

## 2016-12-26 DIAGNOSIS — Z7984 Long term (current) use of oral hypoglycemic drugs: Secondary | ICD-10-CM | POA: Diagnosis not present

## 2016-12-26 DIAGNOSIS — Z801 Family history of malignant neoplasm of trachea, bronchus and lung: Secondary | ICD-10-CM | POA: Diagnosis not present

## 2016-12-26 DIAGNOSIS — Z882 Allergy status to sulfonamides status: Secondary | ICD-10-CM | POA: Diagnosis not present

## 2016-12-26 DIAGNOSIS — Z510 Encounter for antineoplastic radiation therapy: Secondary | ICD-10-CM | POA: Diagnosis not present

## 2016-12-26 DIAGNOSIS — K219 Gastro-esophageal reflux disease without esophagitis: Secondary | ICD-10-CM | POA: Diagnosis not present

## 2016-12-26 DIAGNOSIS — Z8673 Personal history of transient ischemic attack (TIA), and cerebral infarction without residual deficits: Secondary | ICD-10-CM | POA: Diagnosis not present

## 2016-12-26 DIAGNOSIS — Z170 Estrogen receptor positive status [ER+]: Secondary | ICD-10-CM | POA: Diagnosis not present

## 2016-12-26 DIAGNOSIS — J45909 Unspecified asthma, uncomplicated: Secondary | ICD-10-CM | POA: Diagnosis not present

## 2016-12-26 DIAGNOSIS — M797 Fibromyalgia: Secondary | ICD-10-CM | POA: Diagnosis not present

## 2016-12-26 DIAGNOSIS — E1140 Type 2 diabetes mellitus with diabetic neuropathy, unspecified: Secondary | ICD-10-CM | POA: Diagnosis not present

## 2016-12-26 DIAGNOSIS — C50412 Malignant neoplasm of upper-outer quadrant of left female breast: Secondary | ICD-10-CM | POA: Diagnosis not present

## 2016-12-26 DIAGNOSIS — Z79899 Other long term (current) drug therapy: Secondary | ICD-10-CM | POA: Diagnosis not present

## 2016-12-29 ENCOUNTER — Ambulatory Visit
Admission: RE | Admit: 2016-12-29 | Discharge: 2016-12-29 | Disposition: A | Discharge status: Home / Self Care | Payer: Medicare Other | Source: Ambulatory Visit | Attending: Radiation Oncology | Admitting: Radiation Oncology

## 2016-12-29 DIAGNOSIS — Z885 Allergy status to narcotic agent status: Secondary | ICD-10-CM | POA: Diagnosis not present

## 2016-12-29 DIAGNOSIS — Z79899 Other long term (current) drug therapy: Secondary | ICD-10-CM | POA: Diagnosis not present

## 2016-12-29 DIAGNOSIS — Z170 Estrogen receptor positive status [ER+]: Secondary | ICD-10-CM | POA: Diagnosis not present

## 2016-12-29 DIAGNOSIS — I10 Essential (primary) hypertension: Secondary | ICD-10-CM | POA: Diagnosis not present

## 2016-12-29 DIAGNOSIS — J45909 Unspecified asthma, uncomplicated: Secondary | ICD-10-CM | POA: Diagnosis not present

## 2016-12-29 DIAGNOSIS — E1140 Type 2 diabetes mellitus with diabetic neuropathy, unspecified: Secondary | ICD-10-CM | POA: Diagnosis not present

## 2016-12-29 DIAGNOSIS — C50412 Malignant neoplasm of upper-outer quadrant of left female breast: Secondary | ICD-10-CM | POA: Diagnosis not present

## 2016-12-29 DIAGNOSIS — Z882 Allergy status to sulfonamides status: Secondary | ICD-10-CM | POA: Diagnosis not present

## 2016-12-29 DIAGNOSIS — Z7984 Long term (current) use of oral hypoglycemic drugs: Secondary | ICD-10-CM | POA: Diagnosis not present

## 2016-12-29 DIAGNOSIS — K219 Gastro-esophageal reflux disease without esophagitis: Secondary | ICD-10-CM | POA: Diagnosis not present

## 2016-12-29 DIAGNOSIS — Z510 Encounter for antineoplastic radiation therapy: Secondary | ICD-10-CM | POA: Diagnosis not present

## 2016-12-29 DIAGNOSIS — Z801 Family history of malignant neoplasm of trachea, bronchus and lung: Secondary | ICD-10-CM | POA: Diagnosis not present

## 2016-12-29 DIAGNOSIS — Z8673 Personal history of transient ischemic attack (TIA), and cerebral infarction without residual deficits: Secondary | ICD-10-CM | POA: Diagnosis not present

## 2016-12-29 DIAGNOSIS — Z880 Allergy status to penicillin: Secondary | ICD-10-CM | POA: Diagnosis not present

## 2016-12-29 DIAGNOSIS — M797 Fibromyalgia: Secondary | ICD-10-CM | POA: Diagnosis not present

## 2016-12-30 ENCOUNTER — Other Ambulatory Visit: Payer: Self-pay | Admitting: Radiation Oncology

## 2016-12-30 ENCOUNTER — Ambulatory Visit
Admission: RE | Admit: 2016-12-30 | Discharge: 2016-12-30 | Disposition: A | Discharge status: Home / Self Care | Payer: Medicare Other | Source: Ambulatory Visit | Attending: Radiation Oncology | Admitting: Radiation Oncology

## 2016-12-30 DIAGNOSIS — C50412 Malignant neoplasm of upper-outer quadrant of left female breast: Secondary | ICD-10-CM | POA: Diagnosis not present

## 2016-12-30 DIAGNOSIS — Z880 Allergy status to penicillin: Secondary | ICD-10-CM | POA: Diagnosis not present

## 2016-12-30 DIAGNOSIS — Z7984 Long term (current) use of oral hypoglycemic drugs: Secondary | ICD-10-CM | POA: Diagnosis not present

## 2016-12-30 DIAGNOSIS — Z885 Allergy status to narcotic agent status: Secondary | ICD-10-CM | POA: Diagnosis not present

## 2016-12-30 DIAGNOSIS — Z170 Estrogen receptor positive status [ER+]: Secondary | ICD-10-CM | POA: Insufficient documentation

## 2016-12-30 DIAGNOSIS — Z79899 Other long term (current) drug therapy: Secondary | ICD-10-CM | POA: Insufficient documentation

## 2016-12-30 DIAGNOSIS — Z510 Encounter for antineoplastic radiation therapy: Secondary | ICD-10-CM | POA: Insufficient documentation

## 2016-12-30 DIAGNOSIS — Z882 Allergy status to sulfonamides status: Secondary | ICD-10-CM | POA: Insufficient documentation

## 2016-12-30 MED ORDER — PROCHLORPERAZINE MALEATE 10 MG PO TABS: 10.0000 mg | ORAL_TABLET | Freq: Four times a day (QID) | ORAL | 0 refills | Status: DC | PRN

## 2016-12-31 ENCOUNTER — Ambulatory Visit
Admission: RE | Admit: 2016-12-31 | Discharge: 2016-12-31 | Disposition: A | Discharge status: Home / Self Care | Payer: Medicare Other | Source: Ambulatory Visit | Attending: Radiation Oncology | Admitting: Radiation Oncology

## 2016-12-31 DIAGNOSIS — Z510 Encounter for antineoplastic radiation therapy: Secondary | ICD-10-CM | POA: Diagnosis not present

## 2016-12-31 DIAGNOSIS — Z170 Estrogen receptor positive status [ER+]: Secondary | ICD-10-CM | POA: Diagnosis not present

## 2016-12-31 DIAGNOSIS — Z885 Allergy status to narcotic agent status: Secondary | ICD-10-CM | POA: Diagnosis not present

## 2016-12-31 DIAGNOSIS — Z7984 Long term (current) use of oral hypoglycemic drugs: Secondary | ICD-10-CM | POA: Diagnosis not present

## 2016-12-31 DIAGNOSIS — Z880 Allergy status to penicillin: Secondary | ICD-10-CM | POA: Diagnosis not present

## 2016-12-31 DIAGNOSIS — Z79899 Other long term (current) drug therapy: Secondary | ICD-10-CM | POA: Diagnosis not present

## 2016-12-31 DIAGNOSIS — C50412 Malignant neoplasm of upper-outer quadrant of left female breast: Secondary | ICD-10-CM | POA: Diagnosis not present

## 2016-12-31 DIAGNOSIS — Z882 Allergy status to sulfonamides status: Secondary | ICD-10-CM | POA: Diagnosis not present

## 2017-01-01 ENCOUNTER — Ambulatory Visit
Admission: RE | Admit: 2017-01-01 | Discharge: 2017-01-01 | Disposition: A | Discharge status: Home / Self Care | Payer: Medicare Other | Source: Ambulatory Visit | Attending: Radiation Oncology | Admitting: Radiation Oncology

## 2017-01-01 DIAGNOSIS — Z170 Estrogen receptor positive status [ER+]: Secondary | ICD-10-CM | POA: Diagnosis not present

## 2017-01-01 DIAGNOSIS — Z510 Encounter for antineoplastic radiation therapy: Secondary | ICD-10-CM | POA: Diagnosis not present

## 2017-01-01 DIAGNOSIS — Z79899 Other long term (current) drug therapy: Secondary | ICD-10-CM | POA: Diagnosis not present

## 2017-01-01 DIAGNOSIS — Z882 Allergy status to sulfonamides status: Secondary | ICD-10-CM | POA: Diagnosis not present

## 2017-01-01 DIAGNOSIS — Z7984 Long term (current) use of oral hypoglycemic drugs: Secondary | ICD-10-CM | POA: Diagnosis not present

## 2017-01-01 DIAGNOSIS — Z885 Allergy status to narcotic agent status: Secondary | ICD-10-CM | POA: Diagnosis not present

## 2017-01-01 DIAGNOSIS — Z880 Allergy status to penicillin: Secondary | ICD-10-CM | POA: Diagnosis not present

## 2017-01-01 DIAGNOSIS — C50412 Malignant neoplasm of upper-outer quadrant of left female breast: Secondary | ICD-10-CM | POA: Diagnosis not present

## 2017-01-02 ENCOUNTER — Ambulatory Visit
Admission: RE | Admit: 2017-01-02 | Discharge: 2017-01-02 | Disposition: A | Discharge status: Home / Self Care | Payer: Medicare Other | Source: Ambulatory Visit | Attending: Radiation Oncology | Admitting: Radiation Oncology

## 2017-01-02 DIAGNOSIS — Z7984 Long term (current) use of oral hypoglycemic drugs: Secondary | ICD-10-CM | POA: Diagnosis not present

## 2017-01-02 DIAGNOSIS — Z885 Allergy status to narcotic agent status: Secondary | ICD-10-CM | POA: Diagnosis not present

## 2017-01-02 DIAGNOSIS — C50412 Malignant neoplasm of upper-outer quadrant of left female breast: Secondary | ICD-10-CM

## 2017-01-02 DIAGNOSIS — Z170 Estrogen receptor positive status [ER+]: Secondary | ICD-10-CM | POA: Diagnosis not present

## 2017-01-02 DIAGNOSIS — Z880 Allergy status to penicillin: Secondary | ICD-10-CM | POA: Diagnosis not present

## 2017-01-02 DIAGNOSIS — Z882 Allergy status to sulfonamides status: Secondary | ICD-10-CM | POA: Diagnosis not present

## 2017-01-02 DIAGNOSIS — Z79899 Other long term (current) drug therapy: Secondary | ICD-10-CM | POA: Diagnosis not present

## 2017-01-02 DIAGNOSIS — Z510 Encounter for antineoplastic radiation therapy: Secondary | ICD-10-CM | POA: Diagnosis not present

## 2017-01-02 MED ORDER — RADIAPLEXRX EX GEL
Freq: Once | CUTANEOUS | Status: AC
  Administered 2017-01-02: 11:00:00 via TOPICAL

## 2017-01-05 ENCOUNTER — Other Ambulatory Visit: Payer: Self-pay

## 2017-01-05 ENCOUNTER — Ambulatory Visit
Admission: RE | Admit: 2017-01-05 | Discharge: 2017-01-05 | Disposition: A | Discharge status: Home / Self Care | Payer: Medicare Other | Source: Ambulatory Visit | Attending: Radiation Oncology | Admitting: Radiation Oncology

## 2017-01-05 DIAGNOSIS — Z170 Estrogen receptor positive status [ER+]: Principal | ICD-10-CM

## 2017-01-05 DIAGNOSIS — Z880 Allergy status to penicillin: Secondary | ICD-10-CM | POA: Diagnosis not present

## 2017-01-05 DIAGNOSIS — C50412 Malignant neoplasm of upper-outer quadrant of left female breast: Secondary | ICD-10-CM

## 2017-01-05 DIAGNOSIS — Z79899 Other long term (current) drug therapy: Secondary | ICD-10-CM | POA: Diagnosis not present

## 2017-01-05 DIAGNOSIS — Z882 Allergy status to sulfonamides status: Secondary | ICD-10-CM | POA: Diagnosis not present

## 2017-01-05 DIAGNOSIS — Z510 Encounter for antineoplastic radiation therapy: Secondary | ICD-10-CM | POA: Diagnosis not present

## 2017-01-05 DIAGNOSIS — C50912 Malignant neoplasm of unspecified site of left female breast: Secondary | ICD-10-CM

## 2017-01-05 DIAGNOSIS — Z885 Allergy status to narcotic agent status: Secondary | ICD-10-CM | POA: Diagnosis not present

## 2017-01-05 DIAGNOSIS — Z7984 Long term (current) use of oral hypoglycemic drugs: Secondary | ICD-10-CM | POA: Diagnosis not present

## 2017-01-05 NOTE — Progress Notes (Signed)
[name]  [phone] [phone]     ID: [name] DOB: [birth year]  MR#: [id number]  PYK#:[id number]  Patient Care Team: Redmond School, MD as PCP - General (Internal Medicine) [name], MD as Consulting Physician (General Surgery) Magrinat, Virgie Dad, MD as Consulting Physician (Oncology) [name], MD as Consulting Physician (Radiation Oncology) [name], MD as Consulting Physician (Gastroenterology) Chauncey Cruel, MD OTHER MD:  CHIEF COMPLAINT: Estrogen receptor positive breast cancer  CURRENT TREATMENT: Completing adjuvant radiation; to start anastrozole   BREAST CANCER HISTORY: From the original intake note:  [name] had bilateral screening mammography with tomography at [facility] 08/28/2016. There was a possible mass in the left breast and on 09/16/2016 she was brought back for left diagnostic mammography with tomography and left breast ultrasonography. The left breast density was category B. In the left breast there was an irregular mass with architectural distortion in the upper outer quadrant, which appear to measure approximately 0.8 cm. Thank you by ultrasound this measured 0.9 cm and it was located at the 2:00 axis 7 cm from the nipple. Ultrasound of the axilla was reported as negative.   Biopsy of the left breast mass in question 09/23/2016 showed [facility] [phone]) ductal carcinoma in situ, E-cadherin positive, estrogen receptor 100% positive with strong staining intensity, progesterone receptor 90% positive with moderate staining intensity, with an MIB-1 of 10%, and no HER-2 amplification, signals ratio being 1.05 and the number per cell 1.10.  Her subsequent history is as detailed below  INTERVAL HISTORY: [name] returns today for follow-up and treatment of her estrogen receptor positive breast cancer. She continues on radiation, with her last dose being later this week. She is generally tolerating  this well. She has had fatigue and some desquamation chiefly in the inframammary fold. She tells me "this is nothing I can't deal with". She is ready to begin discussing anti-estrogens.  REVIEW OF SYSTEMS: She tells me taking care of her mother made her tough and she can deal with medical issues. She hurts all the time but she does not like to use any pain medication. She admits to some anxiety. Sometimes she has problems sleeping. A detailed review of systems today was otherwise stable  PAST MEDICAL HISTORY: Past Medical History:  Diagnosis Date  . Anxiety   . Asthma   . Cancer [facility])    Left breast  . Complication of anesthesia    pt states she is hard to wake up after anesthetic-"she was told this at [facility]."   . Constipation   . Constipation   . DDD (degenerative disc disease), lumbar   . Depression    situational  . Diabetes mellitus    Type II  . Dyspnea   . Family history of adverse reaction to anesthesia    Brother- N/V  . Fibromyalgia   . GERD (gastroesophageal reflux disease)   . Headache   . History of blood transfusion    with childbirth  . Hypertension   . Pneumonia 2008  . PONV (postoperative nausea and vomiting)   . Stroke [facility])    mini stroke 2008    PAST SURGICAL HISTORY: Past Surgical History:  Procedure Laterality Date  . ABDOMINAL HYSTERECTOMY     Cervical- cancer cell  . ANTERIOR CERVICAL DISCECTOMY  2007  . BACK SURGERY  2009   Laminectomy  . Back surgery 4 yrs ago  2008  . BALLOON DILATION  10/02/2011   Procedure: BALLOON DILATION;  Surgeon: [name], MD;  [facility];  Service: Endoscopy;  Laterality: N/A;  . BREAST LUMPECTOMY WITH RADIOACTIVE SEED AND SENTINEL LYMPH NODE BIOPSY Left 10/13/2016   Procedure: BREAST LUMPECTOMY WITH RADIOACTIVE SEED AND SENTINEL LYMPH NODE BIOPSY;  Surgeon: [name], MD;  Location: [place];  Service: General;  Laterality: Left;  . CHOLECYSTECTOMY    . COLONOSCOPY WITH PROPOFOL  N/A 04/18/2016   Procedure: COLONOSCOPY WITH PROPOFOL;  Surgeon: [name], MD;  [facility];  Service: Endoscopy;  Laterality: N/A;  955  . FOOT SURGERY     tried to remove bone spur- was unable- "just clean"  . MALONEY DILATION  10/02/2011   Procedure: MALONEY DILATION;  Surgeon: [name], MD;  [facility];  Service: Endoscopy;  Laterality: N/A;  . SAVORY DILATION  10/02/2011   Procedure: SAVORY DILATION;  Surgeon: [name], MD;  [facility];  Service: Endoscopy;  Laterality: N/A;    FAMILY HISTORY Family History  Problem Relation Age of Onset  . Lung cancer Father   The patient's father died at the age of 25, 4 years after being diagnosed with lung cancer in the setting of tobacco abuse. The patient's mother died at the age of 44 from a heart attack. The patient had 6 brothers, 3 sisters. There is no history of breast or ovarian cancer in the family to her knowledge.  GYNECOLOGIC HISTORY:  No LMP recorded. Patient has had a hysterectomy. Menarche age 14, first live birth age 24, the patient is [place] P2. She underwent hysterectomy with unilateral salpingo-oophorectomy at age 66. She used estradiol as hormone replacement until her diagnosis of breast cancer in April 2018.  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 She also has significant fibromyalgia. She is separated but not divorced from her husband [name]. He has significant medical problems. The patient's son [name] lives in [place]; he works at [employer]. The patient tells me [name] was married in [place] because it was his husband's dream. She greatly enjoyed the wedding. Son [name] lives in [place] and works in a Human resources officer. The patient has no grandchildren. She is a Psychologist, forensic.    ADVANCED DIRECTIVES: Not in place; patient is comfortable having her husband as healthcare power of  attorney   HEALTH MAINTENANCE: Social History  Substance Use Topics  . Smoking status: Never Smoker  . Smokeless tobacco: Never Used  . Alcohol use 0.0 oz/week     Comment: glass of wine- occasional     Colonoscopy:  PAP:  Bone density:   Allergies  Allergen Reactions  . Adhesive [Tape] Other (See Comments)    Tears skin  . Amoxicillin-Pot Clavulanate Nausea And Vomiting  . Codeine Itching  . Penicillins Other (See Comments)    Upset stomach  . Sulfonamide Derivatives Other (See Comments)    Burning sensation    Current Outpatient Prescriptions  Medication Sig Dispense Refill  . albuterol (PROVENTIL HFA;VENTOLIN HFA) 108 (90 BASE) MCG/ACT inhaler Inhale 2 puffs into the lungs every 6 (six) hours as needed for wheezing or shortness of breath.    . ALPRAZolam (XANAX) 1 MG tablet Take 1 mg by mouth 4 (four) times daily as needed.     Marland Kitchen anastrozole (ARIMIDEX) 1 MG tablet Take 1 tablet (1 mg total) by mouth daily. 90 tablet 4  . atenolol (TENORMIN) 25 MG  tablet Take 25 mg by mouth daily.     . bisacodyl (DULCOLAX) 5 MG EC tablet Take 10 mg by mouth daily as needed for moderate constipation.    . fluticasone (FLONASE) 50 MCG/ACT nasal spray Place 1 spray into both nostrils daily as needed for allergies or rhinitis.    Marland Kitchen gabapentin (NEURONTIN) 300 MG capsule Take 1 capsule (300 mg total) by mouth at bedtime. 90 capsule 4  . glimepiride (AMARYL) 4 MG tablet Take 4 mg by mouth 2 (two) times daily.    . hyaluronate sodium (RADIAPLEXRX) GEL Apply 1 application topically 2 (two) times daily.    Marland Kitchen lidocaine (LIDODERM) 5 % Place 1 patch onto the skin daily as needed (pain). Remove & Discard patch within 12 hours or as directed by MD     . losartan (COZAAR) 25 MG tablet Take 25 mg by mouth daily.    . metFORMIN (GLUCOPHAGE) 1000 MG tablet Take 1,000 mg by mouth 2 (two) times daily.     . methocarbamol (ROBAXIN) 500 MG tablet Take 1 tablet (500 mg total) by mouth every 6 (six) hours as  needed for muscle spasms. 20 tablet 0  . montelukast (SINGULAIR) 10 MG tablet Take 10 mg by mouth daily as needed.    . non-metallic deodorant Jethro Poling) MISC Apply 1 application topically daily as needed.    . Oxycodone HCl 10 MG TABS Take 10 mg by mouth every 6 (six) hours as needed (pain).     . prochlorperazine (COMPAZINE) 10 MG tablet Take 1 tablet (10 mg total) by mouth every 6 (six) hours as needed for nausea or vomiting. 30 tablet 0  . terbinafine (LAMISIL) 1 % cream Apply 1 application topically 2 (two) times daily. For feet     No current facility-administered medications for this visit.     OBJECTIVE: Middle-aged white woman In no acute distress  Vitals:   01/06/17 1158  BP: 138/69  Pulse: 67  Resp: 18  Temp: 97.9 F (36.6 C)     Body mass index is 40.95 kg/m.    ECOG FS:1 - Symptomatic but completely ambulatory  Sclerae unicteric, pupils round and equal Oropharynx clear and moist No cervical or supraclavicular adenopathy Lungs no rales or rhonchi Heart regular rate and rhythm Abd soft, nontender, positive bowel sounds MSK no focal spinal tenderness, no upper extremity lymphedema Neuro: nonfocal, well oriented, appropriate affect Breasts: The right breast is unremarkable. The left breast is status post lumpectomy and is currently receiving radiation. There is erythema over the port, although not marked, and there is desquamation in the inframammary fold. Both axillae are benign.  LAB RESULTS:  CMP     Component Value Date/Time   NA 140 01/06/2017 1124   Laurie 4.3 01/06/2017 1124   CL 104 04/11/2016 1108   CO2 23 01/06/2017 1124   GLUCOSE 245 (H) 01/06/2017 1124   BUN 12.0 01/06/2017 1124   CREATININE 1.1 01/06/2017 1124   CALCIUM 9.8 01/06/2017 1124   PROT 6.8 01/06/2017 1124   ALBUMIN 3.7 01/06/2017 1124   AST 46 (H) 01/06/2017 1124   ALT 59 (H) 01/06/2017 1124   ALKPHOS 70 01/06/2017 1124   BILITOT 0.80 01/06/2017 1124   GFRNONAA >60 04/11/2016 1108   GFRAA  >60 04/11/2016 1108    No results found for: TOTALPROTELP, ALBUMINELP, A1GS, A2GS, BETS, BETA2SER, GAMS, MSPIKE, SPEI  No results found for: KPAFRELGTCHN, LAMBDASER, KAPLAMBRATIO  Lab Results  Component Value Date   WBC 5.7 01/06/2017  NEUTROABS 3.5 01/06/2017   HGB 14.2 01/06/2017   HCT 42.2 01/06/2017   MCV 90.4 01/06/2017   PLT 159 01/06/2017      Chemistry      Component Value Date/Time   NA 140 01/06/2017 1124   Laurie 4.3 01/06/2017 1124   CL 104 04/11/2016 1108   CO2 23 01/06/2017 1124   BUN 12.0 01/06/2017 1124   CREATININE 1.1 01/06/2017 1124      Component Value Date/Time   CALCIUM 9.8 01/06/2017 1124   ALKPHOS 70 01/06/2017 1124   AST 46 (H) 01/06/2017 1124   ALT 59 (H) 01/06/2017 1124   BILITOT 0.80 01/06/2017 1124       No results found for: LABCA2  No components found for: [id number]  No results for input(s): INR in the last 168 hours.  Urinalysis    Component Value Date/Time   COLORURINE YELLOW 07/30/2014 1600   APPEARANCEUR CLEAR 07/30/2014 1600   LABSPEC 1.020 07/30/2014 1600   PHURINE 5.5 07/30/2014 1600   GLUCOSEU >1000 (A) 07/30/2014 1600   HGBUR NEGATIVE 07/30/2014 1600   BILIRUBINUR NEGATIVE 07/30/2014 1600   KETONESUR NEGATIVE 07/30/2014 1600   PROTEINUR NEGATIVE 07/30/2014 1600   UROBILINOGEN 0.2 07/30/2014 1600   NITRITE NEGATIVE 07/30/2014 1600   LEUKOCYTESUR NEGATIVE 07/30/2014 1600     STUDIES: No results found.  ELIGIBLE FOR AVAILABLE RESEARCH PROTOCOL: no  ASSESSMENT: 61 y.o. [place], [place] woman status post left breast upper outer quadrant biopsy 09/23/2016 for a clinical T1b N0, [place] IA t invasive ductal carcinoma, grade 1, estrogen and progesterone receptor positive, HER-2 not amplified, with an MIB-1 of 10%  (1) status post left lumpectomy and sentinel lymph node sampling 10/13/2016 for a pT1c pN0, stage IA invasive ductal carcinoma, grade 1, with negative margins.  (2) Oncotype DX score of 25 predicts a 10 year risk  of recurrence outside the breast of 17% if the patient's only systemic therapy is tamoxifen for 5 years  (a) the possible benefit from chemotherapy in the 5% range was discussed. The patient declined adjuvant chemotherapy  (3) adjuvant radiation to be completed 01/08/2017.  (4) To start anastrozole 02/14/2017  (a) bone density 12/01/2016 shows a T score of 1.3 (normal).    PLAN: I spent approximately 30 minutes with [name] with most of that time spent discussing her overall situation. She has done fantastic with radiation and will finish in 2 days.  My guess is that it will take her about a month to begin to recover some of her energy so we are not going to start anti-estrogens until September 1.  We specifically discussed anastrozole. I think this is a good drug for her especially since she just had a bone density in June which was normal.  The problem is that she already has "aches all over" because of her fibromyalgia. She feels if things really got persistently worse she would be able to tell the difference. If so then I think anastrozole would be a better choice for her. That is the one we put it.  I am also writing for gabapentin for her to take at bedtime. That should help with the nighttime hot flashes.    Chauncey Cruel, MD   01/06/2017 9:40 PM Medical Oncology and Hematology [facility] [address] [place], [place] [address] Tel. [phone]    Fax. [phone]

## 2017-01-06 ENCOUNTER — Ambulatory Visit
Admission: RE | Admit: 2017-01-06 | Discharge: 2017-01-06 | Disposition: A | Discharge status: Home / Self Care | Payer: Medicare Other | Source: Ambulatory Visit | Attending: Radiation Oncology | Admitting: Radiation Oncology

## 2017-01-06 ENCOUNTER — Ambulatory Visit (HOSPITAL_BASED_OUTPATIENT_CLINIC_OR_DEPARTMENT_OTHER): Payer: Medicare Other | Admitting: Oncology

## 2017-01-06 ENCOUNTER — Other Ambulatory Visit (HOSPITAL_BASED_OUTPATIENT_CLINIC_OR_DEPARTMENT_OTHER): Payer: Medicare Other

## 2017-01-06 VITALS — BP 138/69 | HR 67 | Temp 97.9°F | Resp 18 | Ht 61.5 in | Wt 220.3 lb

## 2017-01-06 DIAGNOSIS — Z7984 Long term (current) use of oral hypoglycemic drugs: Secondary | ICD-10-CM | POA: Diagnosis not present

## 2017-01-06 DIAGNOSIS — Z880 Allergy status to penicillin: Secondary | ICD-10-CM | POA: Diagnosis not present

## 2017-01-06 DIAGNOSIS — Z170 Estrogen receptor positive status [ER+]: Secondary | ICD-10-CM

## 2017-01-06 DIAGNOSIS — Z510 Encounter for antineoplastic radiation therapy: Secondary | ICD-10-CM | POA: Diagnosis not present

## 2017-01-06 DIAGNOSIS — C50912 Malignant neoplasm of unspecified site of left female breast: Secondary | ICD-10-CM

## 2017-01-06 DIAGNOSIS — R234 Changes in skin texture: Secondary | ICD-10-CM | POA: Diagnosis not present

## 2017-01-06 DIAGNOSIS — C50412 Malignant neoplasm of upper-outer quadrant of left female breast: Secondary | ICD-10-CM | POA: Diagnosis not present

## 2017-01-06 DIAGNOSIS — R5383 Other fatigue: Secondary | ICD-10-CM | POA: Diagnosis not present

## 2017-01-06 DIAGNOSIS — Z79899 Other long term (current) drug therapy: Secondary | ICD-10-CM | POA: Diagnosis not present

## 2017-01-06 DIAGNOSIS — Z882 Allergy status to sulfonamides status: Secondary | ICD-10-CM | POA: Diagnosis not present

## 2017-01-06 DIAGNOSIS — Z885 Allergy status to narcotic agent status: Secondary | ICD-10-CM | POA: Diagnosis not present

## 2017-01-06 LAB — CBC WITH DIFFERENTIAL/PLATELET
BASO%: 0.2 % (ref 0.0–2.0)
Basophils Absolute: 0 10*3/uL (ref 0.0–0.1)
EOS%: 1.2 % (ref 0.0–7.0)
Eosinophils Absolute: 0.1 10*3/uL (ref 0.0–0.5)
HCT: 42.2 % (ref 34.8–46.6)
HGB: 14.2 g/dL (ref 11.6–15.9)
LYMPH%: 26.2 % (ref 14.0–49.7)
MCH: 30.4 pg (ref 25.1–34.0)
MCHC: 33.6 g/dL (ref 31.5–36.0)
MCV: 90.4 fL (ref 79.5–101.0)
MONO#: 0.6 10*3/uL (ref 0.1–0.9)
MONO%: 10.4 % (ref 0.0–14.0)
NEUT%: 62 % (ref 38.4–76.8)
NEUTROS ABS: 3.5 10*3/uL (ref 1.5–6.5)
PLATELETS: 159 10*3/uL (ref 145–400)
RBC: 4.67 10*6/uL (ref 3.70–5.45)
RDW: 12.7 % (ref 11.2–14.5)
WBC: 5.7 10*3/uL (ref 3.9–10.3)
lymph#: 1.5 10*3/uL (ref 0.9–3.3)

## 2017-01-06 LAB — COMPREHENSIVE METABOLIC PANEL
ALT: 59 U/L — AB (ref 0–55)
AST: 46 U/L — AB (ref 5–34)
Albumin: 3.7 g/dL (ref 3.5–5.0)
Alkaline Phosphatase: 70 U/L (ref 40–150)
Anion Gap: 10 mEq/L (ref 3–11)
BUN: 12 mg/dL (ref 7.0–26.0)
CHLORIDE: 107 meq/L (ref 98–109)
CO2: 23 meq/L (ref 22–29)
CREATININE: 1.1 mg/dL (ref 0.6–1.1)
Calcium: 9.8 mg/dL (ref 8.4–10.4)
EGFR: 56 mL/min/{1.73_m2} — ABNORMAL LOW (ref >90)
GLUCOSE: 245 mg/dL — AB (ref 70–140)
POTASSIUM: 4.3 meq/L (ref 3.5–5.1)
SODIUM: 140 meq/L (ref 136–145)
Total Bilirubin: 0.8 mg/dL (ref 0.20–1.20)
Total Protein: 6.8 g/dL (ref 6.4–8.3)

## 2017-01-06 LAB — DRAW EXTRA CLOT TUBE

## 2017-01-06 MED ORDER — SONAFINE EX EMUL
1.0000 "application " | Freq: Once | CUTANEOUS | Status: AC
  Administered 2017-01-06: 1 via TOPICAL

## 2017-01-06 MED ORDER — GABAPENTIN 300 MG PO CAPS: 300.0000 mg | ORAL_CAPSULE | Freq: Every day | ORAL | 4 refills | Status: DC

## 2017-01-06 MED ORDER — SONAFINE EX EMUL: 1.0000 "application " | Freq: Once | CUTANEOUS | Status: DC

## 2017-01-06 MED ORDER — ANASTROZOLE 1 MG PO TABS: 1.0000 mg | ORAL_TABLET | Freq: Every day | ORAL | 4 refills | Status: DC

## 2017-01-07 ENCOUNTER — Ambulatory Visit
Admission: RE | Admit: 2017-01-07 | Discharge: 2017-01-07 | Disposition: A | Discharge status: Home / Self Care | Payer: Medicare Other | Source: Ambulatory Visit | Attending: Radiation Oncology | Admitting: Radiation Oncology

## 2017-01-07 DIAGNOSIS — Z882 Allergy status to sulfonamides status: Secondary | ICD-10-CM | POA: Diagnosis not present

## 2017-01-07 DIAGNOSIS — C50412 Malignant neoplasm of upper-outer quadrant of left female breast: Secondary | ICD-10-CM | POA: Diagnosis not present

## 2017-01-07 DIAGNOSIS — Z880 Allergy status to penicillin: Secondary | ICD-10-CM | POA: Diagnosis not present

## 2017-01-07 DIAGNOSIS — Z885 Allergy status to narcotic agent status: Secondary | ICD-10-CM | POA: Diagnosis not present

## 2017-01-07 DIAGNOSIS — Z170 Estrogen receptor positive status [ER+]: Secondary | ICD-10-CM | POA: Diagnosis not present

## 2017-01-07 DIAGNOSIS — Z510 Encounter for antineoplastic radiation therapy: Secondary | ICD-10-CM | POA: Diagnosis not present

## 2017-01-07 DIAGNOSIS — Z79899 Other long term (current) drug therapy: Secondary | ICD-10-CM | POA: Diagnosis not present

## 2017-01-07 DIAGNOSIS — Z7984 Long term (current) use of oral hypoglycemic drugs: Secondary | ICD-10-CM | POA: Diagnosis not present

## 2017-01-08 ENCOUNTER — Ambulatory Visit
Admission: RE | Admit: 2017-01-08 | Discharge: 2017-01-08 | Disposition: A | Discharge status: Home / Self Care | Payer: Medicare Other | Source: Ambulatory Visit | Attending: Radiation Oncology | Admitting: Radiation Oncology

## 2017-01-08 ENCOUNTER — Encounter: Payer: Self-pay | Admitting: Radiation Oncology

## 2017-01-08 DIAGNOSIS — Z885 Allergy status to narcotic agent status: Secondary | ICD-10-CM | POA: Diagnosis not present

## 2017-01-08 DIAGNOSIS — Z7984 Long term (current) use of oral hypoglycemic drugs: Secondary | ICD-10-CM | POA: Diagnosis not present

## 2017-01-08 DIAGNOSIS — Z79899 Other long term (current) drug therapy: Secondary | ICD-10-CM | POA: Diagnosis not present

## 2017-01-08 DIAGNOSIS — C50412 Malignant neoplasm of upper-outer quadrant of left female breast: Secondary | ICD-10-CM | POA: Diagnosis not present

## 2017-01-08 DIAGNOSIS — Z882 Allergy status to sulfonamides status: Secondary | ICD-10-CM | POA: Diagnosis not present

## 2017-01-08 DIAGNOSIS — Z170 Estrogen receptor positive status [ER+]: Secondary | ICD-10-CM | POA: Diagnosis not present

## 2017-01-08 DIAGNOSIS — Z880 Allergy status to penicillin: Secondary | ICD-10-CM | POA: Diagnosis not present

## 2017-01-08 DIAGNOSIS — Z510 Encounter for antineoplastic radiation therapy: Secondary | ICD-10-CM | POA: Diagnosis not present

## 2017-01-12 NOTE — Progress Notes (Signed)
  Radiation Oncology         [phone] ________________________________  Name: [name] MRN: [id number]  Date: 01/08/2017  DOB: Feb 12, 1956  End of Treatment Note  Diagnosis:   61 y.o. woman with Stage IA (pT1c, pN0) UOQ Left Breast Invasive Ductal Carcinoma, ER+ / PR+ / Her2-, Grade 1.  Indication for treatment:  Curative        Radiation treatment dates:  12/01/16 - 01/08/17  Site/dose:   Left Breast// 50.4 Gy in 28 fractions  Beams/energy:  Photon // 10X  // 3D  Narrative: The patient tolerated radiation treatment relatively well.  She reported having pain due to Fibromylai, pain felt like a pinching sensation in left breast. She reported having fatigue, nausea, "oozing" under her breast. The skin on her left breast was red with dermatitis. She was given Sonafine and Benadryl to help with skim dermaitis. She used Radiaplex, Sonafine, Hydrocortinsone, Bendrayl, cornstarch and telfa pads throughout her treatments.  Plan: The patient has completed radiation treatment. The patient will return to radiation oncology clinic for routine followup in one month. I advised them to call or return sooner if they have any questions or concerns related to their recovery or treatment.  -----------------------------------  Blair Promise, PhD, MD  This document serves as a record of services personally performed by [name] MD. It was created on his behalf by Delton Coombes, a trained medical scribe. The creation of this record is based on the scribe's personal observations and the provider's statements to them. This document has been checked and approved by the attending provider.

## 2017-02-06 ENCOUNTER — Encounter: Payer: Self-pay | Admitting: Oncology

## 2017-02-09 ENCOUNTER — Encounter: Payer: Self-pay | Admitting: Radiation Oncology

## 2017-02-09 ENCOUNTER — Ambulatory Visit
Admission: RE | Admit: 2017-02-09 | Discharge: 2017-02-09 | Disposition: A | Discharge status: Home / Self Care | Payer: Medicare Other | Source: Ambulatory Visit | Attending: Radiation Oncology | Admitting: Radiation Oncology

## 2017-02-09 DIAGNOSIS — Z885 Allergy status to narcotic agent status: Secondary | ICD-10-CM | POA: Diagnosis not present

## 2017-02-09 DIAGNOSIS — C50412 Malignant neoplasm of upper-outer quadrant of left female breast: Secondary | ICD-10-CM | POA: Diagnosis not present

## 2017-02-09 DIAGNOSIS — Z170 Estrogen receptor positive status [ER+]: Secondary | ICD-10-CM | POA: Diagnosis not present

## 2017-02-09 DIAGNOSIS — Z7984 Long term (current) use of oral hypoglycemic drugs: Secondary | ICD-10-CM | POA: Diagnosis not present

## 2017-02-09 DIAGNOSIS — Z510 Encounter for antineoplastic radiation therapy: Secondary | ICD-10-CM | POA: Diagnosis not present

## 2017-02-09 DIAGNOSIS — Z882 Allergy status to sulfonamides status: Secondary | ICD-10-CM | POA: Diagnosis not present

## 2017-02-09 DIAGNOSIS — Z79899 Other long term (current) drug therapy: Secondary | ICD-10-CM | POA: Diagnosis not present

## 2017-02-09 DIAGNOSIS — Z880 Allergy status to penicillin: Secondary | ICD-10-CM | POA: Diagnosis not present

## 2017-02-09 NOTE — Progress Notes (Signed)
Radiation Oncology         [phone] ________________________________  Name: [name] MRN: [id number]  Date: 02/09/2017  DOB: 03-Oct-1955    Follow-Up Visit Note  CC: Redmond School, MD  Magrinat, Virgie Dad, MD    ICD-10-CM   1. Malignant neoplasm of upper-outer quadrant of left breast in female, estrogen receptor positive ([place]) C50.412    Z17.0     Diagnosis:   61 y.o. woman with Stage IA (pT1c, pN0) UOQ Left Breast Invasive Ductal Carcinoma, ER+ / PR+ / Her2-, Grade 1.  Interval Since Last Radiation:  1 months  Radiation treatment dates:  12/01/16 - 01/08/17  Left Breast// 50.4 Gy in 28 fractions  Narrative:  The patient returns today for routine follow-up after treatment to her left breast. She reports having generalized aching today which she thinks is from fibromyalgia. She will start taking anastrozole on 02/14/17. She reports feeling fatigue. Nursing notes, skin on her left breast has dry peeling under the breast. She denies nipple discharge or bleeding. She does report some mild tenderness in the breast at times. She is scheduled for follow up in medical oncology with Wilber Bihari NP on 03/20/17 and with Dr. Jana Hakim on 05/05/17.   ALLERGIES:  is allergic to adhesive [tape]; amoxicillin-pot clavulanate; codeine; penicillins; and sulfonamide derivatives.  Meds: Current Outpatient Prescriptions  Medication Sig Dispense Refill  . albuterol (PROVENTIL HFA;VENTOLIN HFA) 108 (90 BASE) MCG/ACT inhaler Inhale 2 puffs into the lungs every 6 (six) hours as needed for wheezing or shortness of breath.    . ALPRAZolam (XANAX) 1 MG tablet Take 1 mg by mouth 4 (four) times daily as needed.     Marland Kitchen atenolol (TENORMIN) 25 MG tablet Take 25 mg by mouth daily.     . bisacodyl (DULCOLAX) 5 MG EC tablet Take 10 mg by mouth daily as needed for moderate constipation.    . fluticasone (FLONASE) 50 MCG/ACT nasal spray Place 1 spray into both nostrils daily as needed for allergies or  rhinitis.    Marland Kitchen gabapentin (NEURONTIN) 300 MG capsule Take 1 capsule (300 mg total) by mouth at bedtime. 90 capsule 4  . glimepiride (AMARYL) 4 MG tablet Take 4 mg by mouth 2 (two) times daily.    Marland Kitchen lidocaine (LIDODERM) 5 % Place 1 patch onto the skin daily as needed (pain). Remove & Discard patch within 12 hours or as directed by MD     . losartan (COZAAR) 25 MG tablet Take 25 mg by mouth daily.    . metFORMIN (GLUCOPHAGE) 1000 MG tablet Take 1,000 mg by mouth 2 (two) times daily.     . methocarbamol (ROBAXIN) 500 MG tablet Take 1 tablet (500 mg total) by mouth every 6 (six) hours as needed for muscle spasms. 20 tablet 0  . montelukast (SINGULAIR) 10 MG tablet Take 10 mg by mouth daily as needed.    . Oxycodone HCl 10 MG TABS Take 10 mg by mouth every 6 (six) hours as needed (pain).     . prochlorperazine (COMPAZINE) 10 MG tablet Take 1 tablet (10 mg total) by mouth every 6 (six) hours as needed for nausea or vomiting. 30 tablet 0  . terbinafine (LAMISIL) 1 % cream Apply 1 application topically 2 (two) times daily. For feet    . anastrozole (ARIMIDEX) 1 MG tablet Take 1 tablet (1 mg total) by mouth daily. (Patient not taking: Reported on 02/09/2017) 90 tablet 4  . meclizine (ANTIVERT) 25 MG tablet     .  non-metallic deodorant (ALRA) MISC Apply 1 application topically daily as needed.     No current facility-administered medications for this encounter.     Physical Findings: The patient is in no acute distress. Patient is alert and oriented.  height is 5' 1.5" (1.562 m) and weight is 220 lb 3.2 oz (99.9 kg). Her oral temperature is 97.8 F (36.6 C). Her blood pressure is 124/63 and her pulse is 74. Her oxygen saturation is 98%. .  Lungs are clear to auscultation bilaterally. Heart has regular rate and rhythm. No palpable cervical, supraclavicular, or axillary adenopathy. Abdomen soft, non-tender, normal bowel sounds. Breast exam shows right breast with no palpable mass or nipple discharge. Left  breast shows skin has healed well, with mild hyperpigmentation changes. No dominant mass, nipple discharge or bleeding. There is scar tissue along the lumpectomy scar which is mildly tender with palpation.  Lab Findings: Lab Results  Component Value Date   WBC 5.7 01/06/2017   HGB 14.2 01/06/2017   HCT 42.2 01/06/2017   MCV 90.4 01/06/2017   PLT 159 01/06/2017    Radiographic Findings: No results found.  Impression:  The patient is recovering from the effects of radiation.  No evidence of recurrence on clinical exam today.   Plan:  I encouraged her to continue with follow-up with medical oncology. I will see her back on an as-needed basis. I have encouraged her to call if she has any issues or concerns in the future.   -----------------------------------  Blair Promise, PhD, MD  This document serves as a record of services personally performed by [name], MD. It was created on his behalf by Arlyce Harman, a trained medical scribe. The creation of this record is based on the scribe's personal observations and the provider's statements to them. This document has been checked and approved by the attending provider.

## 2017-02-09 NOTE — Progress Notes (Signed)
[name] is here for follow up after treatment to her left breast.  She reports having generalized aching today which she thinks is from fibromyalgia.  She will start taking anastrozole on 02/14/17.  She reports feeling fatigued.  The skin on her left breast has dry peeling under the breast.  BP 124/63 (BP Location: Right Wrist, Patient Position: Sitting)   Pulse 74   Temp 97.8 F (36.6 C) (Oral)   Ht 5' 1.5" (1.562 m)   Wt 220 lb 3.2 oz (99.9 kg)   SpO2 98%   BMI 40.93 kg/m    Wt Readings from Last 3 Encounters:  02/09/17 220 lb 3.2 oz (99.9 kg)  01/06/17 220 lb 4.8 oz (99.9 kg)  11/17/16 225 lb 6.4 oz (102.2 kg)

## 2017-03-11 ENCOUNTER — Telehealth: Payer: Self-pay

## 2017-03-11 NOTE — Telephone Encounter (Signed)
Called patient to confirm SCP visit appt on 03/20/17.  Patient states she be here.

## 2017-03-16 DIAGNOSIS — M2550 Pain in unspecified joint: Secondary | ICD-10-CM | POA: Diagnosis not present

## 2017-03-16 DIAGNOSIS — E1165 Type 2 diabetes mellitus with hyperglycemia: Secondary | ICD-10-CM | POA: Diagnosis not present

## 2017-03-16 DIAGNOSIS — I10 Essential (primary) hypertension: Secondary | ICD-10-CM | POA: Diagnosis not present

## 2017-03-16 DIAGNOSIS — R201 Hypoesthesia of skin: Secondary | ICD-10-CM | POA: Diagnosis not present

## 2017-03-16 DIAGNOSIS — G894 Chronic pain syndrome: Secondary | ICD-10-CM | POA: Diagnosis not present

## 2017-03-16 DIAGNOSIS — T50905A Adverse effect of unspecified drugs, medicaments and biological substances, initial encounter: Secondary | ICD-10-CM | POA: Diagnosis not present

## 2017-03-20 ENCOUNTER — Ambulatory Visit (HOSPITAL_BASED_OUTPATIENT_CLINIC_OR_DEPARTMENT_OTHER): Payer: Medicare Other | Admitting: Adult Health

## 2017-03-20 VITALS — BP 115/73 | HR 71 | Temp 97.7°F | Resp 18 | Ht 61.5 in | Wt 218.6 lb

## 2017-03-20 DIAGNOSIS — Z79811 Long term (current) use of aromatase inhibitors: Secondary | ICD-10-CM

## 2017-03-20 DIAGNOSIS — Z170 Estrogen receptor positive status [ER+]: Secondary | ICD-10-CM | POA: Diagnosis not present

## 2017-03-20 DIAGNOSIS — C50412 Malignant neoplasm of upper-outer quadrant of left female breast: Secondary | ICD-10-CM | POA: Diagnosis not present

## 2017-03-20 NOTE — Progress Notes (Signed)
CLINIC:  Survivorship   REASON FOR VISIT:  Routine follow-up post-treatment for a recent history of breast cancer.  BRIEF ONCOLOGIC HISTORY:    Malignant neoplasm of upper-outer quadrant of left breast in female, estrogen receptor positive ([place])   09/23/2016 Initial Biopsy    Left breast upper outer quadrant biopsy: IDC, g1, ER+(100%), PR+(90%), Ki-67 10%, HER-2 negative (ratio 1.05).        09/30/2016 Initial Diagnosis    Malignant neoplasm of upper-outer quadrant of left breast in female, estrogen receptor positive ([place])     10/13/2016 Surgery    Left lumpectomy and SLNB: IDC, 1.1 cm, g 1, margins negative, 4 SLN negative, T1cN0      10/13/2016 Oncotype testing    25/17% intermediate risk      12/01/2016 - 01/08/2017 Radiation Therapy    Adjuvant radiation ([place]): Left Breast// 50.4 Gy in 28 fractions      02/2017 -  Anti-estrogen oral therapy    Anastrozole daily (bone density 11/2016 normal)       INTERVAL HISTORY:  Ms. Jaster presents to the [facility] today for our initial meeting to review her survivorship care plan detailing her treatment course for breast cancer, as well as monitoring long-term side effects of that treatment, education regarding health maintenance, screening, and overall wellness and health promotion.     Overall, Ms. Vath reports feeling very achy on the Anastrozole that she is taking.  She is taking it, but really is having a hard time with functioning.  She denies any other issues currently.      REVIEW OF SYSTEMS:  Review of Systems  Constitutional: Negative for appetite change, chills, fatigue and fever.  HENT:   Negative for hearing loss and lump/mass.   Eyes: Negative for eye problems and icterus.  Respiratory: Negative for chest tightness, cough and shortness of breath.   Cardiovascular: Negative for chest pain, leg swelling and palpitations.  Gastrointestinal: Negative for abdominal distention, abdominal pain, constipation,  diarrhea, nausea and vomiting.  Endocrine: Negative for hot flashes.  Musculoskeletal: Positive for arthralgias.  Skin: Negative for itching and rash.  Neurological: Negative for dizziness, extremity weakness, headaches and numbness.  Hematological: Negative for adenopathy. Does not bruise/bleed easily.  Psychiatric/Behavioral: Negative for depression. The patient is not nervous/anxious.   Breast: Denies any new nodularity, masses, tenderness, nipple changes, or nipple discharge.      ONCOLOGY TREATMENT TEAM:  1. Surgeon:  Dr. Donne Hazel at [facility] 2. Medical Oncologist: Dr. Jana Hakim  3. Radiation Oncologist: Dr. Sondra Come    PAST MEDICAL/SURGICAL HISTORY:  Past Medical History:  Diagnosis Date  . Anxiety   . Asthma   . Cancer [facility])    Left breast  . Complication of anesthesia    pt states she is hard to wake up after anesthetic-"she was told this at [facility]."   . Constipation   . Constipation   . DDD (degenerative disc disease), lumbar   . Depression    situational  . Diabetes mellitus    Type II  . Dyspnea   . Family history of adverse reaction to anesthesia    Brother- N/V  . Fibromyalgia   . GERD (gastroesophageal reflux disease)   . Headache   . History of blood transfusion    with childbirth  . History of radiation therapy 12/01/16-01/08/17   left breast 50.4 Gy in 28 fractions  . Hypertension   . Pneumonia 2008  . PONV (postoperative nausea and vomiting)   .  Stroke [facility])    mini stroke 2008   Past Surgical History:  Procedure Laterality Date  . ABDOMINAL HYSTERECTOMY     Cervical- cancer cell  . ANTERIOR CERVICAL DISCECTOMY  2007  . BACK SURGERY  2009   Laminectomy  . Back surgery 4 yrs ago  2008  . BALLOON DILATION  10/02/2011   Procedure: BALLOON DILATION;  Surgeon: [name], MD;  [facility];  Service: Endoscopy;  Laterality: N/A;  . BREAST LUMPECTOMY WITH RADIOACTIVE SEED AND SENTINEL LYMPH NODE BIOPSY  Left 10/13/2016   Procedure: BREAST LUMPECTOMY WITH RADIOACTIVE SEED AND SENTINEL LYMPH NODE BIOPSY;  Surgeon: [name], MD;  Location: [place];  Service: General;  Laterality: Left;  . CHOLECYSTECTOMY    . COLONOSCOPY WITH PROPOFOL N/A 04/18/2016   Procedure: COLONOSCOPY WITH PROPOFOL;  Surgeon: [name], MD;  [facility];  Service: Endoscopy;  Laterality: N/A;  955  . FOOT SURGERY     tried to remove bone spur- was unable- "just clean"  . MALONEY DILATION  10/02/2011   Procedure: MALONEY DILATION;  Surgeon: [name], MD;  [facility];  Service: Endoscopy;  Laterality: N/A;  . SAVORY DILATION  10/02/2011   Procedure: SAVORY DILATION;  Surgeon: [name], MD;  [facility];  Service: Endoscopy;  Laterality: N/A;     ALLERGIES:  Allergies  Allergen Reactions  . Adhesive [Tape] Other (See Comments)    Tears skin  . Amoxicillin-Pot Clavulanate Nausea And Vomiting  . Codeine Itching  . Penicillins Other (See Comments)    Upset stomach  . Sulfonamide Derivatives Other (See Comments)    Burning sensation     CURRENT MEDICATIONS:  Outpatient Encounter Prescriptions as of 03/20/2017  Medication Sig  . albuterol (PROVENTIL HFA;VENTOLIN HFA) 108 (90 BASE) MCG/ACT inhaler Inhale 2 puffs into the lungs every 6 (six) hours as needed for wheezing or shortness of breath.  . ALPRAZolam (XANAX) 1 MG tablet Take 1 mg by mouth 4 (four) times daily as needed.   Marland Kitchen anastrozole (ARIMIDEX) 1 MG tablet Take 1 tablet (1 mg total) by mouth daily. (Patient not taking: Reported on 02/09/2017)  . atenolol (TENORMIN) 25 MG tablet Take 25 mg by mouth daily.   . bisacodyl (DULCOLAX) 5 MG EC tablet Take 10 mg by mouth daily as needed for moderate constipation.  . fluticasone (FLONASE) 50 MCG/ACT nasal spray Place 1 spray into both nostrils daily as needed for allergies or rhinitis.  Marland Kitchen gabapentin (NEURONTIN) 300 MG capsule Take 1 capsule (300 mg total) by  mouth at bedtime.  Marland Kitchen glimepiride (AMARYL) 4 MG tablet Take 4 mg by mouth 2 (two) times daily.  Marland Kitchen lidocaine (LIDODERM) 5 % Place 1 patch onto the skin daily as needed (pain). Remove & Discard patch within 12 hours or as directed by MD   . losartan (COZAAR) 25 MG tablet Take 25 mg by mouth daily.  . meclizine (ANTIVERT) 25 MG tablet   . metFORMIN (GLUCOPHAGE) 1000 MG tablet Take 1,000 mg by mouth 2 (two) times daily.   . methocarbamol (ROBAXIN) 500 MG tablet Take 1 tablet (500 mg total) by mouth every 6 (six) hours as needed for muscle spasms.  . montelukast (SINGULAIR) 10 MG tablet Take 10 mg by mouth daily as needed.  . non-metallic deodorant Jethro Poling) MISC Apply 1 application topically daily as needed.  . Oxycodone HCl 10 MG TABS Take 10 mg by mouth every 6 (six) hours as  needed (pain).   . prochlorperazine (COMPAZINE) 10 MG tablet Take 1 tablet (10 mg total) by mouth every 6 (six) hours as needed for nausea or vomiting.  . terbinafine (LAMISIL) 1 % cream Apply 1 application topically 2 (two) times daily. For feet   No facility-administered encounter medications on file as of 03/20/2017.      ONCOLOGIC FAMILY HISTORY:  Family History  Problem Relation Age of Onset  . Lung cancer Father        SOCIAL HISTORY:  [name] is single and lives in [place], [place].  She denies any current or history of tobacco, alcohol, or illicit drug use.     PHYSICAL EXAMINATION:  Vital Signs:   Vitals:   03/20/17 1350  BP: 115/73  Pulse: 71  Resp: 18  Temp: 97.7 F (36.5 C)  SpO2: 99%   Filed Weights   03/20/17 1350  Weight: 218 lb 9.6 oz (99.2 kg)   General: Well-nourished, well-appearing female in no acute distress.  She is unaccompanied today.   HEENT: Head is normocephalic.  Pupils equal and reactive to light. Conjunctivae clear without exudate.  Sclerae anicteric. Oral mucosa is pink, moist.  Oropharynx is pink without lesions or erythema.  Lymph: No cervical,  supraclavicular, or infraclavicular lymphadenopathy noted on palpation.  Cardiovascular: Regular rate and rhythm.Marland Kitchen Respiratory: Clear to auscultation bilaterally. Chest expansion symmetric; breathing non-labored.  GI: Abdomen soft and round; non-tender, non-distended. Bowel sounds normoactive.  GU: Deferred.  Neuro: No focal deficits. Steady gait.  Psych: Mood and affect normal and appropriate for situation.  Extremities: No edema. MSK: No focal spinal tenderness to palpation.  Full range of motion in bilateral upper extremities Skin: Warm and dry.  LABORATORY DATA:  None for this visit.  DIAGNOSTIC IMAGING:  None for this visit.      ASSESSMENT AND PLAN:  Ms.. Foulk is a pleasant 61 y.o. female with Stage IA left breast invasive ductal carcinoma, ER+/PR+/HER2-, diagnosed in 09/2016, treated with lumpectomy, adjuvant radiation therapy, and anti-estrogen therapy with Anastrozole beginning in 02/2017.  She presents to the Survivorship Clinic for our initial meeting and routine follow-up post-completion of treatment for breast cancer.    1. Stage IA right/left breast cancer:  Ms. Mccubbins is continuing to recover from definitive treatment for breast cancer. She will follow-up with her medical oncologist, Dr. Jana Hakim in 04/2017 with history and physical exam per surveillance protocol.  She is  Having a lot of pain from taking the Anastrozole and we discussed her stopping it today.  Today, a comprehensive survivorship care plan and treatment summary was reviewed with the patient today detailing her breast cancer diagnosis, treatment course, potential late/long-term effects of treatment, appropriate follow-up care with recommendations for the future, and patient education resources.  A copy of this summary, along with a letter will be sent to the patient's primary care provider via mail/fax/In Basket message after today's visit.    2. Arthralgias: Likely related to the Anastrozole.  I reviewed in  detail different aromatase inhibitors along with Tamoxifen as alternatives to the Anastrozole she is already taking.  She has already sorted her pills for the week.  She is planning on continuing anastrozole, and stopping in about a week.  I told her she could then follow up with Dr. Jana Hakim at her already scheduled appointment in November to discuss what medication she could change to.  3. Bone health:  Given Ms. Dadamo's age/history of breast cancer and her current treatment regimen including anti-estrogen  therapy with Anastrozole, she is at risk for bone demineralization.  Her last DEXA scan was 11/2016, which showed normal results.  I reviewed she is at increased risk for bone demineralization and she was encouraged to increase her consumption of foods rich in calcium, as well as increase her weight-bearing activities.  She was given education on specific activities to promote bone health.  4. Cancer screening:  Due to Ms. Ratterree's history and her age, she should receive screening for skin cancers, colon cancer, and gynecologic cancers.  The information and recommendations are listed on the patient's comprehensive care plan/treatment summary and were reviewed in detail with the patient.    5. Health maintenance and wellness promotion: Ms. Pellicane was encouraged to consume 5-7 servings of fruits and vegetables per day. We reviewed the "Nutrition Rainbow" handout, as well as the handout "Take Control of Your Health and Reduce Your Cancer Risk" from the [place].  She was also encouraged to engage in moderate to vigorous exercise for 30 minutes per day most days of the week. We discussed the [employer] fitness program, which is designed for cancer survivors to help them become more physically fit after cancer treatments.  She was instructed to limit her alcohol consumption and continue to abstain from tobacco use.     6. Support services/counseling: It is not uncommon for this period of  the patient's cancer care trajectory to be one of many emotions and stressors.  We discussed an opportunity for her to participate in the next session of [facility] ("Finding Your New Normal") support group series designed for patients after they have completed treatment.   Ms. Igoe was encouraged to take advantage of our many other support services programs, support groups, and/or counseling in coping with her new life as a cancer survivor after completing anti-cancer treatment.  She was offered support today through active listening and expressive supportive counseling.  She was given information regarding our available services and encouraged to contact me with any questions or for help enrolling in any of our support group/programs.    Dispo:   -Return to cancer center in 04/2017 for f/u with Dr. Jana Hakim -Mammogram due in 08/2017 -Bone density due in 11/2018 -Follow up with Dr. Donne Hazel in 05/2017 -She is welcome to return back to the Survivorship Clinic at any time; no additional follow-up needed at this time.  -Consider referral back to survivorship as a long-term survivor for continued surveillance  A total of (30) minutes of face-to-face time was spent with this patient with greater than 50% of that time in counseling and care-coordination.   Gardenia Phlegm, NP Survivorship Program [facility] [phone]   Note: PRIMARY CARE PROVIDER [employer], [place] [phone]

## 2017-03-21 ENCOUNTER — Encounter: Payer: Self-pay | Admitting: Adult Health

## 2017-04-20 DIAGNOSIS — H6993 Unspecified Eustachian tube disorder, bilateral: Secondary | ICD-10-CM | POA: Diagnosis not present

## 2017-04-20 DIAGNOSIS — J019 Acute sinusitis, unspecified: Secondary | ICD-10-CM | POA: Diagnosis not present

## 2017-05-04 ENCOUNTER — Other Ambulatory Visit: Payer: Self-pay | Admitting: *Deleted

## 2017-05-04 DIAGNOSIS — C50412 Malignant neoplasm of upper-outer quadrant of left female breast: Secondary | ICD-10-CM

## 2017-05-04 DIAGNOSIS — Z170 Estrogen receptor positive status [ER+]: Principal | ICD-10-CM

## 2017-05-04 NOTE — Progress Notes (Signed)
[facility]  Telephone:([phone]) [phone] Fax:(336) [phone]     ID: [name] DOB: 1955-11-11  MR#: [id number]  DGU#:[id number]  Patient Care Team: Elfredia Nevins, MD as PCP - General (Internal Medicine) Emelia Loron, MD as Consulting Physician (General Surgery) Caffie Sotto, Valentino Hue, MD as Consulting Physician (Oncology) Antony Blackbird, MD as Consulting Physician (Radiation Oncology) Malissa Hippo, MD as Consulting Physician (Gastroenterology) Axel Filler, Larna Daughters, NP as Nurse Practitioner (Hematology and Oncology)  OTHER MD:  CHIEF COMPLAINT: Estrogen receptor positive breast cancer  CURRENT TREATMENT: Tamoxifen  BREAST CANCER HISTORY: From the original intake note:  Ellinor had bilateral screening mammography with tomography at [facility] 08/28/2016. There was a possible mass in the left breast and on 09/16/2016 she was brought back for left diagnostic mammography with tomography and left breast ultrasonography. The left breast density was category B. In the left breast there was an irregular mass with architectural distortion in the upper outer quadrant, which appear to measure approximately 0.8 cm. Thank you by ultrasound this measured 0.9 cm and it was located at the 2:00 axis 7 cm from the nipple. Ultrasound of the axilla was reported as negative.   Biopsy of the left breast mass in question 09/23/2016 showed [facility] [place] [phone]) ductal carcinoma in situ, E-cadherin positive, estrogen receptor 100% positive with strong staining intensity, progesterone receptor 90% positive with moderate staining intensity, with an MIB-1 of 10%, and no HER-2 amplification, signals ratio being 1.05 and the number per cell 1.10.  Her subsequent history is as detailed below  INTERVAL HISTORY: [name] returns today for follow-up and treatment of her estrogen receptor positive breast cancer.  She has been on anastrozole, with poor tolerance. She notes that she stopped taking  it in October. She notes that had severe arthralgias and myalgias in her arms and legs. Since ending anastrozole, she is not aching as bad. She notes that she had pain in her legs prior to starting anastrozole but reports that the pain was noticeably different.She reports having hot flashes.   REVIEW OF SYSTEMS: [name] reports she has had some recent upper respiratory issues. She reports that she have pneumonia last year. She notes having a hacking cough that produces phlegm. She follows her PCP, Dr. Sherwood Gambler. Additionally, she reports that her left hand has been hurting more recently. She reports a popping sensation and trigger fingers in the left hand. She denies unusual headaches, visual changes, nausea, vomiting, or dizziness. There has been no unusual cough, phlegm production, or pleurisy. This been no change in bowel or bladder habits. She denies unexplained fatigue or unexplained weight loss, bleeding, rash, or fever. A detailed review of systems was otherwise stable.   PAST MEDICAL HISTORY: Past Medical History:  Diagnosis Date  . Anxiety   . Asthma   . Cancer [facility])    Left breast  . Complication of anesthesia    pt states she is hard to wake up after anesthetic-"she was told this at [facility]."   . Constipation   . Constipation   . DDD (degenerative disc disease), lumbar   . Depression    situational  . Diabetes mellitus    Type II  . Dyspnea   . Family history of adverse reaction to anesthesia    Brother- N/V  . Fibromyalgia   . GERD (gastroesophageal reflux disease)   . Headache   . History of blood transfusion    with childbirth  . History of radiation therapy 12/01/16-01/08/17   left breast  50.4 Gy in 28 fractions  . Hypertension   . Pneumonia 2008  . PONV (postoperative nausea and vomiting)   . Stroke [facility])    mini stroke 2008    PAST SURGICAL HISTORY: Past Surgical History:  Procedure Laterality Date  . ABDOMINAL HYSTERECTOMY     Cervical- cancer cell    . ANTERIOR CERVICAL DISCECTOMY  2007  . BACK SURGERY  2009   Laminectomy  . Back surgery 4 yrs ago  2008  . BALLOON DILATION  10/02/2011   Procedure: BALLOON DILATION;  Surgeon: Malissa Hippo, MD;  Location: [facility] ENDO SUITE;  Service: Endoscopy;  Laterality: N/A;  . BREAST LUMPECTOMY WITH RADIOACTIVE SEED AND SENTINEL LYMPH NODE BIOPSY Left 10/13/2016   Procedure: BREAST LUMPECTOMY WITH RADIOACTIVE SEED AND SENTINEL LYMPH NODE BIOPSY;  Surgeon: Emelia Loron, MD;  Location: [facility] OR;  Service: General;  Laterality: Left;  . CHOLECYSTECTOMY    . COLONOSCOPY WITH PROPOFOL N/A 04/18/2016   Procedure: COLONOSCOPY WITH PROPOFOL;  Surgeon: Malissa Hippo, MD;  Location: [facility] ENDO SUITE;  Service: Endoscopy;  Laterality: N/A;  955  . FOOT SURGERY     tried to remove bone spur- was unable- "just clean"  . MALONEY DILATION  10/02/2011   Procedure: MALONEY DILATION;  Surgeon: Malissa Hippo, MD;  Location: [facility];  Service: Endoscopy;  Laterality: N/A;  . SAVORY DILATION  10/02/2011   Procedure: SAVORY DILATION;  Surgeon: Malissa Hippo, MD;  Location: [facility];  Service: Endoscopy;  Laterality: N/A;    FAMILY HISTORY Family History  Problem Relation Age of Onset  . Lung cancer Father   The patient's father died at the age of 39, 4 years after being diagnosed with lung cancer in the setting of tobacco abuse. The patient's mother died at the age of 42 from a heart attack. The patient had 6 brothers, 3 sisters. There is no history of breast or ovarian cancer in the family to her knowledge.  GYNECOLOGIC HISTORY:  No LMP recorded. Patient has had a hysterectomy. Menarche age 9, first live birth age 20, the patient is GX P2. She underwent hysterectomy with unilateral salpingo-oophorectomy at age 47. She used estradiol as hormone replacement until her diagnosis of breast cancer in April 2018.  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 She also has significant fibromyalgia. She is separated but not divorced from her husband Sheryn Leath.  (As of 05/05/2017 there was back living together).  He has significant medical problems. The patient's son K Carin Hock lives in [place]; he works at [employer]. The patient tells me Thayer Ohm was married in [place] because it was his husband's dream. She greatly enjoyed the wedding. Son Letha Cape lives in [place] and works in a Airline pilot. The patient has no grandchildren. She is a Control and instrumentation engineer.    ADVANCED DIRECTIVES: Not in place; patient is comfortable having her husband as healthcare power of attorney   HEALTH MAINTENANCE: Social History   Tobacco Use  . Smoking status: Never Smoker  . Smokeless tobacco: Never Used  Substance Use Topics  . Alcohol use: Yes    Alcohol/week: 0.0 oz    Comment: glass of wine- occasional  . Drug use: No     Colonoscopy:  PAP:  Bone density:   Allergies  Allergen Reactions  . Adhesive [Tape] Other (See Comments)    Tears skin  . Amoxicillin-Pot Clavulanate Nausea And  Vomiting  . Codeine Itching  . Penicillins Other (See Comments)    Upset stomach  . Sulfonamide Derivatives Other (See Comments)    Burning sensation    Current Outpatient Medications  Medication Sig Dispense Refill  . albuterol (PROVENTIL HFA;VENTOLIN HFA) 108 (90 BASE) MCG/ACT inhaler Inhale 2 puffs into the lungs every 6 (six) hours as needed for wheezing or shortness of breath.    . ALPRAZolam (XANAX) 1 MG tablet Take 1 mg by mouth 4 (four) times daily as needed.     Marland Kitchen anastrozole (ARIMIDEX) 1 MG tablet Take 1 tablet (1 mg total) by mouth daily. 90 tablet 4  . atenolol (TENORMIN) 25 MG tablet Take 25 mg by mouth daily.     . bisacodyl (DULCOLAX) 5 MG EC tablet Take 10 mg by mouth daily as needed for moderate constipation.    . fluticasone (FLONASE) 50 MCG/ACT nasal spray Place 1 spray into both nostrils daily as needed for allergies or rhinitis.    Marland Kitchen  gabapentin (NEURONTIN) 300 MG capsule Take 1 capsule (300 mg total) by mouth at bedtime. 90 capsule 4  . glimepiride (AMARYL) 4 MG tablet Take 4 mg by mouth 2 (two) times daily.    Marland Kitchen lidocaine (LIDODERM) 5 % Place 1 patch onto the skin daily as needed (pain). Remove & Discard patch within 12 hours or as directed by MD     . losartan (COZAAR) 25 MG tablet Take 25 mg by mouth daily.    . meclizine (ANTIVERT) 25 MG tablet     . metFORMIN (GLUCOPHAGE) 1000 MG tablet Take 1,000 mg by mouth 2 (two) times daily.     . methocarbamol (ROBAXIN) 500 MG tablet Take 1 tablet (500 mg total) by mouth every 6 (six) hours as needed for muscle spasms. 20 tablet 0  . montelukast (SINGULAIR) 10 MG tablet Take 10 mg by mouth daily as needed.    . non-metallic deodorant Thornton Papas) MISC Apply 1 application topically daily as needed.    . Oxycodone HCl 10 MG TABS Take 10 mg by mouth every 6 (six) hours as needed (pain).     . prochlorperazine (COMPAZINE) 10 MG tablet Take 1 tablet (10 mg total) by mouth every 6 (six) hours as needed for nausea or vomiting. 30 tablet 0  . terbinafine (LAMISIL) 1 % cream Apply 1 application topically 2 (two) times daily. For feet     No current facility-administered medications for this visit.     OBJECTIVE: Middle-aged white woman who appears stated age  Vitals:   05/05/17 1448  BP: 133/78  Pulse: 76  Resp: 20  Temp: 98.7 F (37.1 C)  SpO2: 99%     Body mass index is 40.41 kg/m.    ECOG FS:1 - Symptomatic but completely ambulatory  Sclerae unicteric, EOMs intact Oropharynx clear and moist No cervical or supraclavicular adenopathy Lungs no rales or rhonchi Heart regular rate and rhythm Abd soft, nontender, positive bowel sounds MSK no focal spinal tenderness, no upper extremity lymphedema Neuro: nonfocal, well oriented, appropriate affect Breasts: The right breast is benign.  The left breast is status post lumpectomy and radiation.  The cosmetic result is excellent.  There is  no evidence of local recurrence.  Both axillae are benign.  LAB RESULTS:  CMP     Component Value Date/Time   NA 140 01/06/2017 1124   K 4.3 01/06/2017 1124   CL 104 04/11/2016 1108   CO2 23 01/06/2017 1124   GLUCOSE 245 (H) 01/06/2017  1124   BUN 12.0 01/06/2017 1124   CREATININE 1.1 01/06/2017 1124   CALCIUM 9.8 01/06/2017 1124   PROT 6.8 01/06/2017 1124   ALBUMIN 3.7 01/06/2017 1124   AST 46 (H) 01/06/2017 1124   ALT 59 (H) 01/06/2017 1124   ALKPHOS 70 01/06/2017 1124   BILITOT 0.80 01/06/2017 1124   GFRNONAA >60 04/11/2016 1108   GFRAA >60 04/11/2016 1108    No results found for: TOTALPROTELP, ALBUMINELP, A1GS, A2GS, BETS, BETA2SER, GAMS, MSPIKE, SPEI  No results found for: Ron Parker, [facility]  Lab Results  Component Value Date   WBC 7.4 05/05/2017   NEUTROABS 4.2 05/05/2017   HGB 13.9 05/05/2017   HCT 41.9 05/05/2017   MCV 91.6 05/05/2017   PLT 182 05/05/2017      Chemistry      Component Value Date/Time   NA 140 01/06/2017 1124   K 4.3 01/06/2017 1124   CL 104 04/11/2016 1108   CO2 23 01/06/2017 1124   BUN 12.0 01/06/2017 1124   CREATININE 1.1 01/06/2017 1124      Component Value Date/Time   CALCIUM 9.8 01/06/2017 1124   ALKPHOS 70 01/06/2017 1124   AST 46 (H) 01/06/2017 1124   ALT 59 (H) 01/06/2017 1124   BILITOT 0.80 01/06/2017 1124       No results found for: LABCA2  No components found for: [id number]  No results for input(s): INR in the last 168 hours.  Urinalysis    Component Value Date/Time   COLORURINE YELLOW 07/30/2014 1600   APPEARANCEUR CLEAR 07/30/2014 1600   LABSPEC 1.020 07/30/2014 1600   PHURINE 5.5 07/30/2014 1600   GLUCOSEU >1000 (A) 07/30/2014 1600   HGBUR NEGATIVE 07/30/2014 1600   BILIRUBINUR NEGATIVE 07/30/2014 1600   KETONESUR NEGATIVE 07/30/2014 1600   PROTEINUR NEGATIVE 07/30/2014 1600   UROBILINOGEN 0.2 07/30/2014 1600   NITRITE NEGATIVE 07/30/2014 1600   LEUKOCYTESUR NEGATIVE 07/30/2014 1600      STUDIES: No results found.  ELIGIBLE FOR AVAILABLE RESEARCH PROTOCOL: no  ASSESSMENT: 61 y.o. [place], [place] woman status post left breast upper outer quadrant biopsy 09/23/2016 for a clinical T1b N0, sage IA t invasive ductal carcinoma, grade 1, estrogen and progesterone receptor positive, HER-2 not amplified, with an MIB-1 of 10%  (1) status post left lumpectomy and sentinel lymph node sampling 10/13/2016 for a pT1c pN0, stage IA invasive ductal carcinoma, grade 1, with negative margins.  (2) Oncotype DX score of 25 predicts a 10 year risk of recurrence outside the breast of 17% if the patient's only systemic therapy is tamoxifen for 5 years  (a) the possible benefit from chemotherapy in the 5% range was discussed. The patient declined adjuvant chemotherapy  (3) adjuvant radiation6/18/18 - 01/08/17 Left Breast// 50.4 Gy in 28 fractions   (4) started anastrozole July 2018; discontinued September 2018 with West effects  (a) bone density 12/01/2016 shows a T score of 1.3 (normal).  (5) to start tamoxifen 05/16/2017    PLAN: [name] has a history of fibromyalgia and it is impossible to tell whether the fibromyalgia or the anastrozole is causing her symptoms or whether he was a combination.  In any case we are not going to be able to continue that medication.  We discussed tamoxifen in detail today.  She has a good understanding of the possible toxicities West effects and complications of this agent.  It is favorable that she is status post hysterectomy.  She understands the possible risk of blood clots  But she is going  to start tamoxifen May 16, 2017.  She is going to see me in February.  If she tolerates it well the plan will be to continue that for a total of 5 years.  Otherwise we will try to give fulvestrant to try.  Was our new partner is settled in [facility] she will want to move her care there for convenience sake  She does have a yeast infection in the  inferior aspect of her right breast and I wrote her a ketoconazole cream to useas needed  She knows to call for any problems that may develop before the next visit.   Teagan Heidrick, Valentino Hue, MD  05/05/17 3:11 PM Medical Oncology and Hematology [facility] [address] [place], [place] [address] Tel. [phone]    Fax. [phone]  This document serves as a record of services personally performed by Ruthann Cancer, MD. It was created on his behalf by Merideth Abbey, a trained medical scribe. The creation of this record is based on the scribe's personal observations and the provider's statements to them.   I have reviewed the above documentation for accuracy and completeness, and I agree with the above.

## 2017-05-05 ENCOUNTER — Telehealth: Payer: Self-pay | Admitting: Oncology

## 2017-05-05 ENCOUNTER — Other Ambulatory Visit (HOSPITAL_BASED_OUTPATIENT_CLINIC_OR_DEPARTMENT_OTHER): Payer: Medicare Other

## 2017-05-05 ENCOUNTER — Ambulatory Visit (HOSPITAL_BASED_OUTPATIENT_CLINIC_OR_DEPARTMENT_OTHER): Payer: Medicare Other | Admitting: Oncology

## 2017-05-05 VITALS — BP 133/78 | HR 76 | Temp 98.7°F | Resp 20 | Ht 61.5 in | Wt 217.4 lb

## 2017-05-05 DIAGNOSIS — C50412 Malignant neoplasm of upper-outer quadrant of left female breast: Secondary | ICD-10-CM | POA: Diagnosis not present

## 2017-05-05 DIAGNOSIS — Z170 Estrogen receptor positive status [ER+]: Secondary | ICD-10-CM

## 2017-05-05 DIAGNOSIS — M797 Fibromyalgia: Secondary | ICD-10-CM

## 2017-05-05 DIAGNOSIS — B372 Candidiasis of skin and nail: Secondary | ICD-10-CM | POA: Diagnosis not present

## 2017-05-05 LAB — CBC WITH DIFFERENTIAL/PLATELET
BASO%: 0.6 % (ref 0.0–2.0)
Basophils Absolute: 0 10*3/uL (ref 0.0–0.1)
EOS ABS: 0.1 10*3/uL (ref 0.0–0.5)
EOS%: 0.9 % (ref 0.0–7.0)
HEMATOCRIT: 41.9 % (ref 34.8–46.6)
HEMOGLOBIN: 13.9 g/dL (ref 11.6–15.9)
LYMPH#: 2.4 10*3/uL (ref 0.9–3.3)
LYMPH%: 32 % (ref 14.0–49.7)
MCH: 30.5 pg (ref 25.1–34.0)
MCHC: 33.3 g/dL (ref 31.5–36.0)
MCV: 91.6 fL (ref 79.5–101.0)
MONO#: 0.7 10*3/uL (ref 0.1–0.9)
MONO%: 9.4 % (ref 0.0–14.0)
NEUT%: 57.1 % (ref 38.4–76.8)
NEUTROS ABS: 4.2 10*3/uL (ref 1.5–6.5)
Platelets: 182 10*3/uL (ref 145–400)
RBC: 4.57 10*6/uL (ref 3.70–5.45)
RDW: 12.7 % (ref 11.2–14.5)
WBC: 7.4 10*3/uL (ref 3.9–10.3)

## 2017-05-05 LAB — COMPREHENSIVE METABOLIC PANEL
ALBUMIN: 3.8 g/dL (ref 3.5–5.0)
ALK PHOS: 74 U/L (ref 40–150)
ALT: 51 U/L (ref 0–55)
ANION GAP: 8 meq/L (ref 3–11)
AST: 39 U/L — ABNORMAL HIGH (ref 5–34)
BILIRUBIN TOTAL: 0.77 mg/dL (ref 0.20–1.20)
BUN: 15 mg/dL (ref 7.0–26.0)
CALCIUM: 9.7 mg/dL (ref 8.4–10.4)
CO2: 26 meq/L (ref 22–29)
CREATININE: 1.2 mg/dL — AB (ref 0.6–1.1)
Chloride: 105 mEq/L (ref 98–109)
EGFR: 50 mL/min/{1.73_m2} — ABNORMAL LOW (ref >60)
Glucose: 194 mg/dl — ABNORMAL HIGH (ref 70–140)
Potassium: 4.4 mEq/L (ref 3.5–5.1)
Sodium: 140 mEq/L (ref 136–145)
TOTAL PROTEIN: 7.3 g/dL (ref 6.4–8.3)

## 2017-05-05 MED ORDER — TAMOXIFEN CITRATE 20 MG PO TABS: 20.0000 mg | ORAL_TABLET | Freq: Every day | ORAL | 12 refills | Status: AC

## 2017-05-05 MED ORDER — KETOCONAZOLE 2 % EX CREA: 1.0000 "application " | TOPICAL_CREAM | Freq: Every day | CUTANEOUS | 0 refills | Status: DC

## 2017-05-05 NOTE — Telephone Encounter (Signed)
Gave patient avs and calendar with appts per 11/20 los.

## 2017-05-12 DIAGNOSIS — J329 Chronic sinusitis, unspecified: Secondary | ICD-10-CM | POA: Diagnosis not present

## 2017-05-20 DIAGNOSIS — E782 Mixed hyperlipidemia: Secondary | ICD-10-CM | POA: Diagnosis not present

## 2017-05-20 DIAGNOSIS — C50912 Malignant neoplasm of unspecified site of left female breast: Secondary | ICD-10-CM | POA: Diagnosis not present

## 2017-05-20 DIAGNOSIS — R498 Other voice and resonance disorders: Secondary | ICD-10-CM | POA: Diagnosis not present

## 2017-05-20 DIAGNOSIS — E119 Type 2 diabetes mellitus without complications: Secondary | ICD-10-CM | POA: Diagnosis not present

## 2017-05-20 DIAGNOSIS — I10 Essential (primary) hypertension: Secondary | ICD-10-CM | POA: Diagnosis not present

## 2017-05-22 DIAGNOSIS — E119 Type 2 diabetes mellitus without complications: Secondary | ICD-10-CM | POA: Diagnosis not present

## 2017-05-22 DIAGNOSIS — E782 Mixed hyperlipidemia: Secondary | ICD-10-CM | POA: Diagnosis not present

## 2017-06-15 DIAGNOSIS — J329 Chronic sinusitis, unspecified: Secondary | ICD-10-CM | POA: Diagnosis not present

## 2017-06-15 DIAGNOSIS — J209 Acute bronchitis, unspecified: Secondary | ICD-10-CM | POA: Diagnosis not present

## 2017-06-15 DIAGNOSIS — G894 Chronic pain syndrome: Secondary | ICD-10-CM | POA: Diagnosis not present

## 2017-06-15 DIAGNOSIS — Z1389 Encounter for screening for other disorder: Secondary | ICD-10-CM | POA: Diagnosis not present

## 2017-06-17 ENCOUNTER — Ambulatory Visit (HOSPITAL_COMMUNITY)
Admission: RE | Admit: 2017-06-17 | Discharge: 2017-06-17 | Disposition: A | Discharge status: Home / Self Care | Payer: Medicare Other | Source: Ambulatory Visit | Attending: Internal Medicine | Admitting: Internal Medicine

## 2017-06-17 ENCOUNTER — Other Ambulatory Visit (HOSPITAL_COMMUNITY): Payer: Self-pay | Admitting: Internal Medicine

## 2017-06-17 DIAGNOSIS — R059 Cough, unspecified: Secondary | ICD-10-CM

## 2017-06-17 DIAGNOSIS — R05 Cough: Secondary | ICD-10-CM | POA: Insufficient documentation

## 2017-06-24 DIAGNOSIS — J209 Acute bronchitis, unspecified: Secondary | ICD-10-CM | POA: Diagnosis not present

## 2017-06-24 DIAGNOSIS — Z1389 Encounter for screening for other disorder: Secondary | ICD-10-CM | POA: Diagnosis not present

## 2017-07-13 ENCOUNTER — Ambulatory Visit (INDEPENDENT_AMBULATORY_CARE_PROVIDER_SITE_OTHER): Payer: Medicare Other | Admitting: Otolaryngology

## 2017-07-13 DIAGNOSIS — R490 Dysphonia: Secondary | ICD-10-CM | POA: Diagnosis not present

## 2017-07-13 DIAGNOSIS — K219 Gastro-esophageal reflux disease without esophagitis: Secondary | ICD-10-CM | POA: Diagnosis not present

## 2017-07-31 NOTE — Progress Notes (Signed)
[place]  [phone] Fax:(336) [phone]     ID: PRAIRIE STENBERG DOB: 1956-02-24  MR#: [id number]  HAL#:[id number]  Patient Care Team: Redmond School, MD as PCP - General (Internal Medicine) [name], MD as Consulting Physician (General Surgery) Magrinat, Virgie Dad, MD as Consulting Physician (Oncology) [name], MD as Consulting Physician (Radiation Oncology) [name], MD as Consulting Physician (Gastroenterology) [name], [name], NP as Nurse Practitioner (Hematology and Oncology)  OTHER MD:  CHIEF COMPLAINT: Estrogen receptor positive breast cancer  CURRENT TREATMENT: Tamoxifen  BREAST CANCER HISTORY: From the original intake note:  Tenae had bilateral screening mammography with tomography at [facility] 08/28/2016. There was a possible mass in the left breast and on 09/16/2016 she was brought back for left diagnostic mammography with tomography and left breast ultrasonography. The left breast density was category B. In the left breast there was an irregular mass with architectural distortion in the upper outer quadrant, which appear to measure approximately 0.8 cm. Thank you by ultrasound this measured 0.9 cm and it was located at the 2:00 axis 7 cm from the nipple. Ultrasound of the axilla was reported as negative.   Biopsy of the left breast mass in question 09/23/2016 showed [facility] [phone]) ductal carcinoma in situ, E-cadherin positive, estrogen receptor 100% positive with strong staining intensity, progesterone receptor 90% positive with moderate staining intensity, with an MIB-1 of 10%, and no HER-2 amplification, signals ratio being 1.05 and the number per cell 1.10.  Her subsequent history is as detailed below  INTERVAL HISTORY: Cherryl returns today for follow-up and treatment of her estrogen receptor positive breast cancer. She continues on tamoxifen, with good tolerance. She thinks she might have a little  diarrhea. She is having more  hot flashes  But noincreased vaginal discharge. She pays $10 for a 3 month supply.   REVIEW OF SYSTEMS: Marise reports that she went to [place] for 3 weeks to visit some friends. She also went to [employer] and [place]. She notes that she thoroughly enjoyed her time. She notes that she had some knee pain when she got back. She notes that she was sick with a cold and she was on steroids to clear it up. She had gurgling in her throat but this had cleared up since then. She notes that her throat was red and irritated. She denies unusual headaches, visual changes, nausea, vomiting, or dizziness. There has been no unusual cough, phlegm production, or pleurisy. This been no change in bowel or bladder habits. She denies unexplained fatigue or unexplained weight loss, bleeding, rash, or fever. A detailed review of systems was otherwise stable.     PAST MEDICAL HISTORY: Past Medical History:  Diagnosis Date  . Anxiety   . Asthma   . Cancer [facility])    Left breast  . Complication of anesthesia    pt states she is hard to wake up after anesthetic-"she was told this at [facility]."   . Constipation   . Constipation   . DDD (degenerative disc disease), lumbar   . Depression    situational  . Diabetes mellitus    Type II  . Dyspnea   . Family history of adverse reaction to anesthesia    Brother- N/V  . Fibromyalgia   . GERD (gastroesophageal reflux disease)   . Headache   . History of blood transfusion    with childbirth  . History of radiation therapy 12/01/16-01/08/17   left breast 50.4 Gy  in 28 fractions  . Hypertension   . Pneumonia 2008  . PONV (postoperative nausea and vomiting)   . Stroke [facility])    mini stroke 2008    PAST SURGICAL HISTORY: Past Surgical History:  Procedure Laterality Date  . ABDOMINAL HYSTERECTOMY     Cervical- cancer cell  . ANTERIOR CERVICAL DISCECTOMY  2007  . BACK SURGERY  2009   Laminectomy  . Back surgery 4  yrs ago  2008  . BALLOON DILATION  10/02/2011   Procedure: BALLOON DILATION;  Surgeon: [name], MD;  [facility];  Service: Endoscopy;  Laterality: N/A;  . BREAST LUMPECTOMY WITH RADIOACTIVE SEED AND SENTINEL LYMPH NODE BIOPSY Left 10/13/2016   Procedure: BREAST LUMPECTOMY WITH RADIOACTIVE SEED AND SENTINEL LYMPH NODE BIOPSY;  Surgeon: [name], MD;  Location: [place];  Service: General;  Laterality: Left;  . CHOLECYSTECTOMY    . COLONOSCOPY WITH PROPOFOL N/A 04/18/2016   Procedure: COLONOSCOPY WITH PROPOFOL;  Surgeon: [name], MD;  [facility];  Service: Endoscopy;  Laterality: N/A;  955  . FOOT SURGERY     tried to remove bone spur- was unable- "just clean"  . MALONEY DILATION  10/02/2011   Procedure: MALONEY DILATION;  Surgeon: [name], MD;  [facility];  Service: Endoscopy;  Laterality: N/A;  . SAVORY DILATION  10/02/2011   Procedure: SAVORY DILATION;  Surgeon: [name], MD;  [facility];  Service: Endoscopy;  Laterality: N/A;    FAMILY HISTORY Family History  Problem Relation Age of Onset  . Lung cancer Father   The patient's father died at the age of 39, 4 years after being diagnosed with lung cancer in the setting of tobacco abuse. The patient's mother died at the age of 75 from a heart attack. The patient had 6 brothers, 3 sisters. There is no history of breast or ovarian cancer in the family to her knowledge.  GYNECOLOGIC HISTORY:  No LMP recorded. Patient has had a hysterectomy. Menarche age 43, first live birth age 33, the patient is [place] P2. She underwent hysterectomy with unilateral salpingo-oophorectomy at age 34. She used estradiol as hormone replacement until her diagnosis of breast cancer in April 2018.  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 She also has significant fibromyalgia. She is separated but not divorced from her husband  Levon Penning.  (As of 05/05/2017 they were back living together).  He has significant medical problems. The patient's son K Pennelope Bracken lives in [place]; he works at [employer]. The patient tells me Gerald Stabs was married in [place] because it was his husband's dream. She greatly enjoyed the wedding. Son Garth Schlatter lives in [place] and works in a Human resources officer. The patient has no grandchildren. She is a Psychologist, forensic.    ADVANCED DIRECTIVES: Not in place; patient is comfortable having her husband as healthcare power of attorney   HEALTH MAINTENANCE: Social History   Tobacco Use  . Smoking status: Never Smoker  . Smokeless tobacco: Never Used  Substance Use Topics  . Alcohol use: Yes    Alcohol/week: 0.0 oz    Comment: glass of wine- occasional  . Drug use: No     Colonoscopy: November 2017/ Dr. Laural Golden  PAP:  Bone density:   Allergies  Allergen Reactions  . Adhesive [Tape] Other (See Comments)    Tears skin  . Amoxicillin-Pot Clavulanate Nausea  And Vomiting  . Codeine Itching  . Penicillins Other (See Comments)    Upset stomach  . Sulfonamide Derivatives Other (See Comments)    Burning sensation    Current Outpatient Medications  Medication Sig Dispense Refill  . albuterol (PROVENTIL HFA;VENTOLIN HFA) 108 (90 BASE) MCG/ACT inhaler Inhale 2 puffs into the lungs every 6 (six) hours as needed for wheezing or shortness of breath.    . ALPRAZolam (XANAX) 1 MG tablet Take 1 mg by mouth 4 (four) times daily as needed.     Marland Kitchen atenolol (TENORMIN) 25 MG tablet Take 25 mg by mouth daily.     . bisacodyl (DULCOLAX) 5 MG EC tablet Take 10 mg by mouth daily as needed for moderate constipation.    . fluticasone (FLONASE) 50 MCG/ACT nasal spray Place 1 spray into both nostrils daily as needed for allergies or rhinitis.    Marland Kitchen gabapentin (NEURONTIN) 300 MG capsule Take 1 capsule (300 mg total) by mouth at bedtime. 90 capsule 4  . glimepiride (AMARYL) 4 MG tablet Take 4 mg by mouth 2 (two)  times daily.    Marland Kitchen ketoconazole (NIZORAL) 2 % cream Apply 1 application topically daily. 15 g 0  . lidocaine (LIDODERM) 5 % Place 1 patch onto the skin daily as needed (pain). Remove & Discard patch within 12 hours or as directed by MD     . losartan (COZAAR) 25 MG tablet Take 25 mg by mouth daily.    . meclizine (ANTIVERT) 25 MG tablet     . metFORMIN (GLUCOPHAGE) 1000 MG tablet Take 1,000 mg by mouth 2 (two) times daily.     . methocarbamol (ROBAXIN) 500 MG tablet Take 1 tablet (500 mg total) by mouth every 6 (six) hours as needed for muscle spasms. 20 tablet 0  . montelukast (SINGULAIR) 10 MG tablet Take 10 mg by mouth daily as needed.    . non-metallic deodorant Jethro Poling) MISC Apply 1 application topically daily as needed.    . Oxycodone HCl 10 MG TABS Take 10 mg by mouth every 6 (six) hours as needed (pain).     . prochlorperazine (COMPAZINE) 10 MG tablet Take 1 tablet (10 mg total) by mouth every 6 (six) hours as needed for nausea or vomiting. 30 tablet 0  . terbinafine (LAMISIL) 1 % cream Apply 1 application topically 2 (two) times daily. For feet     No current facility-administered medications for this visit.     OBJECTIVE: Middle-aged white woman in no acute distress  Vitals:   08/04/17 1051  BP: 112/65  Pulse: 72  Resp: 20  Temp: 98.1 F (36.7 C)  SpO2: 99%     Body mass index is 40.49 kg/m.    ECOG FS:1 - Symptomatic but completely ambulatory  Sclerae unicteric, pupils round and equal Oropharynx clear and moist No cervical or supraclavicular adenopathy Lungs no rales or rhonchi Heart regular rate and rhythm Abd soft, nontender, positive bowel sounds MSK no focal spinal tenderness, no upper extremity lymphedema Neuro: nonfocal, well oriented, appropriate affect Breasts: The right breast is unremarkable.  On the left the patient is undergone lumpectomy and radiation, with no evidence of local recurrence.  Both axillae are benign.  LAB RESULTS:  CMP     Component Value  Date/Time   NA 139 08/04/2017 1012   NA 140 05/05/2017 1431   K 4.4 08/04/2017 1012   K 4.4 05/05/2017 1431   CL 104 08/04/2017 1012   CO2 26 08/04/2017 1012  CO2 26 05/05/2017 1431   GLUCOSE 272 (H) 08/04/2017 1012   GLUCOSE 194 (H) 05/05/2017 1431   BUN 18 08/04/2017 1012   BUN 15.0 05/05/2017 1431   CREATININE 1.09 08/04/2017 1012   CREATININE 1.2 (H) 05/05/2017 1431   CALCIUM 9.5 08/04/2017 1012   CALCIUM 9.7 05/05/2017 1431   PROT 6.3 (L) 08/04/2017 1012   PROT 7.3 05/05/2017 1431   ALBUMIN 3.4 (L) 08/04/2017 1012   ALBUMIN 3.8 05/05/2017 1431   AST 19 08/04/2017 1012   AST 39 (H) 05/05/2017 1431   ALT 23 08/04/2017 1012   ALT 51 05/05/2017 1431   ALKPHOS 74 08/04/2017 1012   ALKPHOS 74 05/05/2017 1431   BILITOT 0.8 08/04/2017 1012   BILITOT 0.77 05/05/2017 1431   GFRNONAA 54 (L) 08/04/2017 1012   GFRAA >60 08/04/2017 1012    No results found for: TOTALPROTELP, ALBUMINELP, A1GS, A2GS, BETS, BETA2SER, GAMS, MSPIKE, SPEI  No results found for: Nils Pyle, [facility]  Lab Results  Component Value Date   WBC 6.4 08/04/2017   NEUTROABS 3.6 08/04/2017   HGB 13.9 05/05/2017   HCT 40.8 08/04/2017   MCV 92.6 08/04/2017   PLT 147 08/04/2017      Chemistry      Component Value Date/Time   NA 139 08/04/2017 1012   NA 140 05/05/2017 1431   K 4.4 08/04/2017 1012   K 4.4 05/05/2017 1431   CL 104 08/04/2017 1012   CO2 26 08/04/2017 1012   CO2 26 05/05/2017 1431   BUN 18 08/04/2017 1012   BUN 15.0 05/05/2017 1431   CREATININE 1.09 08/04/2017 1012   CREATININE 1.2 (H) 05/05/2017 1431      Component Value Date/Time   CALCIUM 9.5 08/04/2017 1012   CALCIUM 9.7 05/05/2017 1431   ALKPHOS 74 08/04/2017 1012   ALKPHOS 74 05/05/2017 1431   AST 19 08/04/2017 1012   AST 39 (H) 05/05/2017 1431   ALT 23 08/04/2017 1012   ALT 51 05/05/2017 1431   BILITOT 0.8 08/04/2017 1012   BILITOT 0.77 05/05/2017 1431       No results found for: LABCA2  No  components found for: [id number]  No results for input(s): INR in the last 168 hours.  Urinalysis    Component Value Date/Time   COLORURINE YELLOW 07/30/2014 1600   APPEARANCEUR CLEAR 07/30/2014 1600   LABSPEC 1.020 07/30/2014 1600   PHURINE 5.5 07/30/2014 1600   GLUCOSEU >1000 (A) 07/30/2014 1600   HGBUR NEGATIVE 07/30/2014 1600   BILIRUBINUR NEGATIVE 07/30/2014 1600   KETONESUR NEGATIVE 07/30/2014 1600   PROTEINUR NEGATIVE 07/30/2014 1600   UROBILINOGEN 0.2 07/30/2014 1600   NITRITE NEGATIVE 07/30/2014 1600   LEUKOCYTESUR NEGATIVE 07/30/2014 1600     STUDIES: Repeat mammography is due March 2019  ELIGIBLE FOR AVAILABLE RESEARCH PROTOCOL: no  ASSESSMENT: 62 y.o. [place], [place] woman status post left breast upper outer quadrant biopsy 09/23/2016 for a clinical T1b N0, [place] t invasive ductal carcinoma, grade 1, estrogen and progesterone receptor positive, HER-2 not amplified, with an MIB-1 of 10%  (1) status post left lumpectomy and sentinel lymph node sampling 10/13/2016 for a pT1c pN0, stage IA invasive ductal carcinoma, grade 1, with negative margins.  (2) Oncotype DX score of 25 predicts a 10 year risk of recurrence outside the breast of 17% if the patient's only systemic therapy is tamoxifen for 5 years  (a) the possible benefit from chemotherapy in the 5% range was discussed. The patient declined adjuvant chemotherapy  (3)  adjuvant radiation6/18/18 - 01/08/17 Left Breast// 50.4 Gy in 28 fractions   (4) started anastrozole July 2018; discontinued September 2018 with side effects  (a) bone density 12/01/2016 shows a T score of 1.3 (normal).  (5) started tamoxifen 05/16/2017    PLAN: Julisa is tolerating the tamoxifen much better than she did with the aromatase inhibitors.  Hopefully we will be able to continue this drug for the prescribed 5 years.  I have encouraged her to resume [facility]  If she does  that 3-4 times a week I think that would be phenomenal.  Hot flashes are bothering her.  I think she would benefit from venlafaxine both in terms of her fibromyalgia and the hot flashes.  We discussed the possible toxicity side effects and complications of that agent and I went ahead and put in the prescription for her.  She will see me again in June.  She will see Dr. Donne Hazel in October.  She will then see me again in April 2020 and at that point I will start seeing her on a once a year basis.  She knows to call for any other issues that may develop before the next visit.  Magrinat, Virgie Dad, MD  08/04/17 11:05 AM Medical Oncology and Hematology [facility] [address] [place], [place] [address] Tel. [phone]    Fax. [phone]  This document serves as a record of services personally performed by Lurline Del, MD. It was created on his behalf by Sheron Nightingale, a trained medical scribe. The creation of this record is based on the scribe's personal observations and the provider's statements to them.   I have reviewed the above documentation for accuracy and completeness, and I agree with the above.

## 2017-08-03 ENCOUNTER — Other Ambulatory Visit: Payer: Self-pay | Admitting: *Deleted

## 2017-08-03 DIAGNOSIS — C50412 Malignant neoplasm of upper-outer quadrant of left female breast: Secondary | ICD-10-CM

## 2017-08-03 DIAGNOSIS — Z170 Estrogen receptor positive status [ER+]: Principal | ICD-10-CM

## 2017-08-04 ENCOUNTER — Inpatient Hospital Stay: Payer: Medicare Other | Attending: Oncology | Admitting: Oncology

## 2017-08-04 ENCOUNTER — Inpatient Hospital Stay: Payer: Medicare Other

## 2017-08-04 ENCOUNTER — Telehealth: Payer: Self-pay | Admitting: Oncology

## 2017-08-04 VITALS — BP 112/65 | HR 72 | Temp 98.1°F | Resp 20 | Ht 61.5 in | Wt 217.8 lb

## 2017-08-04 DIAGNOSIS — C50412 Malignant neoplasm of upper-outer quadrant of left female breast: Secondary | ICD-10-CM

## 2017-08-04 DIAGNOSIS — Z170 Estrogen receptor positive status [ER+]: Secondary | ICD-10-CM

## 2017-08-04 DIAGNOSIS — N951 Menopausal and female climacteric states: Secondary | ICD-10-CM

## 2017-08-04 DIAGNOSIS — M797 Fibromyalgia: Secondary | ICD-10-CM | POA: Diagnosis not present

## 2017-08-04 DIAGNOSIS — Z79810 Long term (current) use of selective estrogen receptor modulators (SERMs): Secondary | ICD-10-CM | POA: Insufficient documentation

## 2017-08-04 LAB — CMP (CANCER CENTER ONLY)
ALBUMIN: 3.4 g/dL — AB (ref 3.5–5.0)
ALT: 23 U/L (ref 0–55)
AST: 19 U/L (ref 5–34)
Alkaline Phosphatase: 74 U/L (ref 40–150)
Anion gap: 9 (ref 3–11)
BUN: 18 mg/dL (ref 7–26)
CHLORIDE: 104 mmol/L (ref 98–109)
CO2: 26 mmol/L (ref 22–29)
CREATININE: 1.09 mg/dL (ref 0.60–1.10)
Calcium: 9.5 mg/dL (ref 8.4–10.4)
GFR, EST NON AFRICAN AMERICAN: 54 mL/min — AB (ref >60)
GFR, Est AFR Am: >60 mL/min (ref >60)
GLUCOSE: 272 mg/dL — AB (ref 70–140)
POTASSIUM: 4.4 mmol/L (ref 3.5–5.1)
SODIUM: 139 mmol/L (ref 136–145)
Total Bilirubin: 0.8 mg/dL (ref 0.2–1.2)
Total Protein: 6.3 g/dL — ABNORMAL LOW (ref 6.4–8.3)

## 2017-08-04 LAB — CBC WITH DIFFERENTIAL (CANCER CENTER ONLY)
Basophils Absolute: 0 10*3/uL (ref 0.0–0.1)
Basophils Relative: 1 %
EOS ABS: 0 10*3/uL (ref 0.0–0.5)
EOS PCT: 1 %
HCT: 40.8 % (ref 34.8–46.6)
Hemoglobin: 13.8 g/dL (ref 11.6–15.9)
LYMPHS ABS: 2.1 10*3/uL (ref 0.9–3.3)
LYMPHS PCT: 34 %
MCH: 31.2 pg (ref 25.1–34.0)
MCHC: 33.7 g/dL (ref 31.5–36.0)
MCV: 92.6 fL (ref 79.5–101.0)
MONO ABS: 0.5 10*3/uL (ref 0.1–0.9)
MONOS PCT: 9 %
Neutro Abs: 3.6 10*3/uL (ref 1.5–6.5)
Neutrophils Relative %: 57 %
PLATELETS: 147 10*3/uL (ref 145–400)
RBC: 4.41 MIL/uL (ref 3.70–5.45)
RDW: 14.2 % (ref 11.2–14.5)
WBC Count: 6.4 10*3/uL (ref 3.9–10.3)

## 2017-08-04 MED ORDER — VENLAFAXINE HCL ER 75 MG PO CP24: 75.0000 mg | ORAL_CAPSULE | Freq: Every day | ORAL | 4 refills | Status: DC

## 2017-08-04 NOTE — Telephone Encounter (Signed)
Gave avs and calendar for june °

## 2017-09-02 DIAGNOSIS — E1165 Type 2 diabetes mellitus with hyperglycemia: Secondary | ICD-10-CM | POA: Diagnosis not present

## 2017-09-02 DIAGNOSIS — M1991 Primary osteoarthritis, unspecified site: Secondary | ICD-10-CM | POA: Diagnosis not present

## 2017-09-02 DIAGNOSIS — Z1389 Encounter for screening for other disorder: Secondary | ICD-10-CM | POA: Diagnosis not present

## 2017-09-02 DIAGNOSIS — E1129 Type 2 diabetes mellitus with other diabetic kidney complication: Secondary | ICD-10-CM | POA: Diagnosis not present

## 2017-09-02 DIAGNOSIS — M19041 Primary osteoarthritis, right hand: Secondary | ICD-10-CM | POA: Diagnosis not present

## 2017-09-07 ENCOUNTER — Other Ambulatory Visit: Payer: Self-pay | Admitting: *Deleted

## 2017-09-07 MED ORDER — VENLAFAXINE HCL ER 37.5 MG PO CP24: 37.5000 mg | ORAL_CAPSULE | Freq: Every day | ORAL | 0 refills | Status: DC

## 2017-09-23 DIAGNOSIS — R229 Localized swelling, mass and lump, unspecified: Secondary | ICD-10-CM | POA: Diagnosis not present

## 2017-09-23 DIAGNOSIS — M25541 Pain in joints of right hand: Secondary | ICD-10-CM | POA: Diagnosis not present

## 2017-09-23 DIAGNOSIS — M180 Bilateral primary osteoarthritis of first carpometacarpal joints: Secondary | ICD-10-CM | POA: Diagnosis not present

## 2017-11-02 ENCOUNTER — Other Ambulatory Visit: Payer: Self-pay | Admitting: *Deleted

## 2017-11-11 ENCOUNTER — Other Ambulatory Visit: Payer: Self-pay | Admitting: Oncology

## 2017-11-11 DIAGNOSIS — R928 Other abnormal and inconclusive findings on diagnostic imaging of breast: Secondary | ICD-10-CM

## 2017-11-17 ENCOUNTER — Ambulatory Visit (HOSPITAL_COMMUNITY)
Admission: RE | Admit: 2017-11-17 | Discharge: 2017-11-17 | Disposition: A | Discharge status: Home / Self Care | Payer: Medicare Other | Source: Ambulatory Visit | Attending: Oncology | Admitting: Oncology

## 2017-11-17 DIAGNOSIS — C50412 Malignant neoplasm of upper-outer quadrant of left female breast: Secondary | ICD-10-CM

## 2017-11-17 DIAGNOSIS — R928 Other abnormal and inconclusive findings on diagnostic imaging of breast: Secondary | ICD-10-CM | POA: Diagnosis not present

## 2017-11-17 DIAGNOSIS — Z170 Estrogen receptor positive status [ER+]: Secondary | ICD-10-CM | POA: Insufficient documentation

## 2017-11-30 ENCOUNTER — Other Ambulatory Visit: Payer: Self-pay

## 2017-11-30 DIAGNOSIS — C50412 Malignant neoplasm of upper-outer quadrant of left female breast: Secondary | ICD-10-CM

## 2017-11-30 DIAGNOSIS — Z170 Estrogen receptor positive status [ER+]: Principal | ICD-10-CM

## 2017-11-30 NOTE — Progress Notes (Signed)
[facility]  Telephone:([phone]) [phone] Fax:([phone]     ID: [name] DOB: [birth year]  MR#: [id number]  JYN#:[id number]  Patient Care Team: Elfredia Nevins, MD as PCP - General (Internal Medicine) Emelia Loron, MD as Consulting Physician (General Surgery) Neeta Storey, Valentino Hue, MD as Consulting Physician (Oncology) Antony Blackbird, MD as Consulting Physician (Radiation Oncology) Malissa Hippo, MD as Consulting Physician (Gastroenterology) Axel Filler, Larna Daughters, NP as Nurse Practitioner (Hematology and Oncology)   CHIEF COMPLAINT: Estrogen receptor positive breast cancer  CURRENT TREATMENT: Tamoxifen  BREAST CANCER HISTORY: From the original intake note:  [name] had bilateral screening mammography with tomography at [facility] 08/28/2016. There was a possible mass in the left breast and on 09/16/2016 she was brought back for left diagnostic mammography with tomography and left breast ultrasonography. The left breast density was category B. In the left breast there was an irregular mass with architectural distortion in the upper outer quadrant, which appear to measure approximately 0.8 cm. Thank you by ultrasound this measured 0.9 cm and it was located at the 2:00 axis 7 cm from the nipple. Ultrasound of the axilla was reported as negative.   Biopsy of the left breast mass in question 09/23/2016 showed [facility] [phone]) ductal carcinoma in situ, E-cadherin positive, estrogen receptor 100% positive with strong staining intensity, progesterone receptor 90% positive with moderate staining intensity, with an MIB-1 of 10%, and no HER-2 amplification, signals ratio being 1.05 and the number per cell 1.10.  Her subsequent history is as detailed below  INTERVAL HISTORY: [name] returns today for follow-up and treatment of her estrogen receptor positive breast cancer. She continues on tamoxifen, with good tolerance. She has some hair loss. She also has some  insomnia. She denies issues with hot flashes or increased vaginal discharge.   Since her last visit, she underwent diagnostic bilateral mammography with CAD and tomography on 11/17/2017 at [facility] showing: breast density category B. There was no evidence of malignancy.    REVIEW OF SYSTEMS: [name] reports that she went to [place] for a week. She also went to visit family in [place] for 10 days. She was able to minimize her sun exposure at the beach. She has been mindful of her diet and watching her weight. She has lost a few pounds by eating gluten and soy free bread. She has been more fatigued and her legs are more tired, so she hasn't exercised much lately. She usually goes to the gym and gets on the treadmill and bike. She also participates in group swimming exercises. She denies unusual headaches, visual changes, nausea, vomiting, or dizziness. There has been no unusual cough, phlegm production, or pleurisy. This been no change in bowel or bladder habits. She denies unexplained fatigue or unexplained weight loss, bleeding, rash, or fever. A detailed review of systems was otherwise stable.    PAST MEDICAL HISTORY: Past Medical History:  Diagnosis Date  . Anxiety   . Asthma   . Cancer [facility])    Left breast  . Complication of anesthesia    pt states she is hard to wake up after anesthetic-"she was told this at [facility]."   . Constipation   . Constipation   . DDD (degenerative disc disease), lumbar   . Depression    situational  . Diabetes mellitus    Type II  . Dyspnea   . Family history of adverse reaction to anesthesia    Brother- N/V  . Fibromyalgia   . GERD (  gastroesophageal reflux disease)   . Headache   . History of blood transfusion    with childbirth  . History of radiation therapy 12/01/16-01/08/17   left breast 50.4 Gy in 28 fractions  . Hypertension   . Pneumonia 2008  . PONV (postoperative nausea and vomiting)   . Stroke [facility])    mini stroke  2008    PAST SURGICAL HISTORY: Past Surgical History:  Procedure Laterality Date  . ABDOMINAL HYSTERECTOMY     Cervical- cancer cell  . ANTERIOR CERVICAL DISCECTOMY  2007  . BACK SURGERY  2009   Laminectomy  . Back surgery 4 yrs ago  2008  . BALLOON DILATION  10/02/2011   Procedure: BALLOON DILATION;  Surgeon: Malissa Hippo, MD;  Location: [facility];  Service: Endoscopy;  Laterality: N/A;  . BREAST LUMPECTOMY WITH RADIOACTIVE SEED AND SENTINEL LYMPH NODE BIOPSY Left 10/13/2016   Procedure: BREAST LUMPECTOMY WITH RADIOACTIVE SEED AND SENTINEL LYMPH NODE BIOPSY;  Surgeon: Emelia Loron, MD;  Location: [facility] OR;  Service: General;  Laterality: Left;  . CHOLECYSTECTOMY    . COLONOSCOPY WITH PROPOFOL N/A 04/18/2016   Procedure: COLONOSCOPY WITH PROPOFOL;  Surgeon: Malissa Hippo, MD;  Location: [facility];  Service: Endoscopy;  Laterality: N/A;  955  . FOOT SURGERY     tried to remove bone spur- was unable- "just clean"  . MALONEY DILATION  10/02/2011   Procedure: MALONEY DILATION;  Surgeon: Malissa Hippo, MD;  Location: [facility];  Service: Endoscopy;  Laterality: N/A;  . SAVORY DILATION  10/02/2011   Procedure: SAVORY DILATION;  Surgeon: Malissa Hippo, MD;  Location: [facility];  Service: Endoscopy;  Laterality: N/A;    FAMILY HISTORY Family History  Problem Relation Age of Onset  . Lung cancer Father   The patient's father died at the age of 31, 4 years after being diagnosed with lung cancer in the setting of tobacco abuse. The patient's mother died at the age of 37 from a heart attack. The patient had 6 brothers, 3 sisters. There is no history of breast or ovarian cancer in the family to her knowledge.  GYNECOLOGIC HISTORY:  No LMP recorded. Patient has had a hysterectomy. Menarche age 67, first live birth age 4, the patient is GX P2. She underwent hysterectomy with unilateral salpingo-oophorectomy at age 50. She used estradiol as hormone replacement until her  diagnosis of breast cancer in April 2018.  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 She also has significant fibromyalgia. She is separated but not divorced from her husband Paulean Lamotte.  (As of 05/05/2017 they were back living together).  He has significant medical problems. The patient's son K Carin Hock lives in [place]; he works at [employer]. The patient tells me Thayer Ohm was married in [place] because it was his husband's dream. She greatly enjoyed the wedding. Son Letha Cape lives in [place] and works in a Airline pilot. The patient has no grandchildren. She is a Control and instrumentation engineer.    ADVANCED DIRECTIVES: Not in place; patient is comfortable having her husband as healthcare power of attorney   HEALTH MAINTENANCE: Social History   Tobacco Use  . Smoking status: Never Smoker  . Smokeless tobacco: Never Used  Substance Use Topics  . Alcohol use: Yes    Alcohol/week: 0.0 oz    Comment: glass of wine- occasional  . Drug use: No     Colonoscopy:  November 2017/ Dr. Karilyn Cota  PAP:  Bone density:   Allergies  Allergen Reactions  . Adhesive [Tape] Other (See Comments)    Tears skin  . Amoxicillin-Pot Clavulanate Nausea And Vomiting  . Codeine Itching  . Penicillins Other (See Comments)    Upset stomach  . Sulfonamide Derivatives Other (See Comments)    Burning sensation    Current Outpatient Medications  Medication Sig Dispense Refill  . albuterol (PROVENTIL HFA;VENTOLIN HFA) 108 (90 BASE) MCG/ACT inhaler Inhale 2 puffs into the lungs every 6 (six) hours as needed for wheezing or shortness of breath.    . ALPRAZolam (XANAX) 1 MG tablet Take 1 mg by mouth 4 (four) times daily as needed.     Marland Kitchen atenolol (TENORMIN) 25 MG tablet Take 25 mg by mouth daily.     . bisacodyl (DULCOLAX) 5 MG EC tablet Take 10 mg by mouth daily as needed for moderate constipation.    . fluticasone (FLONASE) 50 MCG/ACT nasal spray Place 1  spray into both nostrils daily as needed for allergies or rhinitis.    Marland Kitchen gabapentin (NEURONTIN) 300 MG capsule Take 1 capsule (300 mg total) by mouth at bedtime. 90 capsule 4  . glimepiride (AMARYL) 4 MG tablet Take 4 mg by mouth 2 (two) times daily.    Marland Kitchen ketoconazole (NIZORAL) 2 % cream Apply 1 application topically daily. 15 g 0  . lidocaine (LIDODERM) 5 % Place 1 patch onto the skin daily as needed (pain). Remove & Discard patch within 12 hours or as directed by MD     . losartan (COZAAR) 25 MG tablet Take 25 mg by mouth daily.    . meclizine (ANTIVERT) 25 MG tablet     . metFORMIN (GLUCOPHAGE) 1000 MG tablet Take 1,000 mg by mouth 2 (two) times daily.     . methocarbamol (ROBAXIN) 500 MG tablet Take 1 tablet (500 mg total) by mouth every 6 (six) hours as needed for muscle spasms. 20 tablet 0  . montelukast (SINGULAIR) 10 MG tablet Take 10 mg by mouth daily as needed.    . non-metallic deodorant Thornton Papas) MISC Apply 1 application topically daily as needed.    . Oxycodone HCl 10 MG TABS Take 10 mg by mouth every 6 (six) hours as needed (pain).     . prochlorperazine (COMPAZINE) 10 MG tablet Take 1 tablet (10 mg total) by mouth every 6 (six) hours as needed for nausea or vomiting. 30 tablet 0  . terbinafine (LAMISIL) 1 % cream Apply 1 application topically 2 (two) times daily. For feet    . venlafaxine XR (EFFEXOR-XR) 37.5 MG 24 hr capsule Take 1 capsule (37.5 mg total) by mouth daily with breakfast. 90 capsule 0   No current facility-administered medications for this visit.     OBJECTIVE: Middle-aged white woman who appears stated age  Vitals:   12/01/17 1122  BP: 120/60  Pulse: 79  Resp: 18  Temp: 97.6 F (36.4 C)  SpO2: 99%     Body mass index is 39.95 kg/m.    ECOG FS:1 - Symptomatic but completely ambulatory  Sclerae unicteric, EOMs intact Oropharynx clear and moist No cervical or supraclavicular adenopathy Lungs no rales or rhonchi Heart regular rate and rhythm Abd soft,  nontender, positive bowel sounds MSK no focal spinal tenderness, no upper extremity lymphedema Neuro: nonfocal, well oriented, appropriate affect Breasts: The right breast is benign.  Left breast is status post lumpectomy and radiation.  There is no evidence of local recurrence.  Both axillae are benign.  LAB RESULTS:  CMP     Component Value Date/Time   NA 139 12/01/2017 1032   NA 140 05/05/2017 1431   K 4.3 12/01/2017 1032   K 4.4 05/05/2017 1431   CL 105 12/01/2017 1032   CO2 26 12/01/2017 1032   CO2 26 05/05/2017 1431   GLUCOSE 174 (H) 12/01/2017 1032   GLUCOSE 194 (H) 05/05/2017 1431   BUN 16 12/01/2017 1032   BUN 15.0 05/05/2017 1431   CREATININE 1.24 (H) 12/01/2017 1032   CREATININE 1.2 (H) 05/05/2017 1431   CALCIUM 9.6 12/01/2017 1032   CALCIUM 9.7 05/05/2017 1431   PROT 6.4 12/01/2017 1032   PROT 7.3 05/05/2017 1431   ALBUMIN 3.6 12/01/2017 1032   ALBUMIN 3.8 05/05/2017 1431   AST 20 12/01/2017 1032   AST 39 (H) 05/05/2017 1431   ALT 17 12/01/2017 1032   ALT 51 05/05/2017 1431   ALKPHOS 59 12/01/2017 1032   ALKPHOS 74 05/05/2017 1431   BILITOT 0.6 12/01/2017 1032   BILITOT 0.77 05/05/2017 1431   GFRNONAA 46 (L) 12/01/2017 1032   GFRAA 54 (L) 12/01/2017 1032    No results found for: TOTALPROTELP, ALBUMINELP, A1GS, A2GS, BETS, BETA2SER, GAMS, MSPIKE, SPEI  No results found for: Ron Parker, [facility]  Lab Results  Component Value Date   WBC 6.5 12/01/2017   NEUTROABS 3.8 12/01/2017   HGB 13.3 12/01/2017   HCT 39.5 12/01/2017   MCV 91.6 12/01/2017   PLT 172 12/01/2017      Chemistry      Component Value Date/Time   NA 139 12/01/2017 1032   NA 140 05/05/2017 1431   K 4.3 12/01/2017 1032   K 4.4 05/05/2017 1431   CL 105 12/01/2017 1032   CO2 26 12/01/2017 1032   CO2 26 05/05/2017 1431   BUN 16 12/01/2017 1032   BUN 15.0 05/05/2017 1431   CREATININE 1.24 (H) 12/01/2017 1032   CREATININE 1.2 (H) 05/05/2017 1431      Component  Value Date/Time   CALCIUM 9.6 12/01/2017 1032   CALCIUM 9.7 05/05/2017 1431   ALKPHOS 59 12/01/2017 1032   ALKPHOS 74 05/05/2017 1431   AST 20 12/01/2017 1032   AST 39 (H) 05/05/2017 1431   ALT 17 12/01/2017 1032   ALT 51 05/05/2017 1431   BILITOT 0.6 12/01/2017 1032   BILITOT 0.77 05/05/2017 1431       No results found for: LABCA2  No components found for: [id number]  No results for input(s): INR in the last 168 hours.  Urinalysis    Component Value Date/Time   COLORURINE YELLOW 07/30/2014 1600   APPEARANCEUR CLEAR 07/30/2014 1600   LABSPEC 1.020 07/30/2014 1600   PHURINE 5.5 07/30/2014 1600   GLUCOSEU >1000 (A) 07/30/2014 1600   HGBUR NEGATIVE 07/30/2014 1600   BILIRUBINUR NEGATIVE 07/30/2014 1600   KETONESUR NEGATIVE 07/30/2014 1600   PROTEINUR NEGATIVE 07/30/2014 1600   UROBILINOGEN 0.2 07/30/2014 1600   NITRITE NEGATIVE 07/30/2014 1600   LEUKOCYTESUR NEGATIVE 07/30/2014 1600     STUDIES: Mm Diag Breast Tomo Bilateral  Result Date: 11/17/2017 CLINICAL DATA:  62 year old patient presents for first annual mammogram after therapy for stage I left breast cancer, upper outer quadrant. EXAM: DIGITAL DIAGNOSTIC BILATERAL MAMMOGRAM WITH CAD AND TOMO COMPARISON:  Previous exam(s). ACR Breast Density Category b: There are scattered areas of fibroglandular density. FINDINGS: There are lumpectomy changes with surgical clips and benign fat necrosis in the deep upper outer breast. Slight skin thickening  of the left breast is consistent with radiation therapy. No mass, nonsurgical distortion, or suspicious microcalcification is identified in breast to suggest malignancy. Mammographic images were processed with CAD. IMPRESSION: No evidence of malignancy in either breast. Lumpectomy and radiation changes on left. RECOMMENDATION: Diagnostic mammogram is suggested in 1 year. (Code:[id number]-01Y) I have discussed the findings and recommendations with the patient. Results were also provided in  writing at the conclusion of the visit. If applicable, a reminder letter will be sent to the patient regarding the next appointment. BI-RADS CATEGORY  2: Benign. Electronically Signed   By: Britta Mccreedy M.D.   On: 11/17/2017 14:08     ELIGIBLE FOR AVAILABLE RESEARCH PROTOCOL: no  ASSESSMENT: 62 y.o. [place], [place] woman status post left breast upper outer quadrant biopsy 09/23/2016 for a clinical T1b N0, [place] IA t invasive ductal carcinoma, grade 1, estrogen and progesterone receptor positive, HER-2 not amplified, with an MIB-1 of 10%  (1) status post left lumpectomy and sentinel lymph node sampling 10/13/2016 for a pT1c pN0, stage IA invasive ductal carcinoma, grade 1, with negative margins.  (2) Oncotype DX score of 25 predicts a 10 year risk of recurrence outside the breast of 17% if the patient's only systemic therapy is tamoxifen for 5 years  (a) the possible benefit from chemotherapy in the 5% range was discussed. The patient declined adjuvant chemotherapy  (3) adjuvant radiation6/18/18 - 01/08/17 Left Breast// 50.4 Gy in 28 fractions   (4) started anastrozole July 2018; discontinued September 2018 with side effects  (a) bone density 12/01/2016 shows a T score of 1.3 (normal).  (5) started tamoxifen 05/16/2017    PLAN: Zariah is now a little over a year out from definitive surgery for her breast cancer with no evidence of disease recurrence.  This is favorable.  She is tolerating tamoxifen well and the plan will be to continue that a total of 5 years.  We discussed her kidney function which is a little bit down.  She does need to get her diabetes controlled and drink more water.  I have encouraged her to exercise regularly  We also discussed diet issues at length  She will see me again in April.  From that point I will start seeing her on a once a year basis.  She knows to call for any problems that may develop before the next visit.   Ardene Remley, Valentino Hue, MD  12/01/17  12:01 PM Medical Oncology and Hematology [facility] [address] [place], [place] [address] Tel. [phone]    Fax. [phone]  Fonnie Birkenhead, am acting as scribe for Lowella Dell MD.  I, Ruthann Cancer MD, have reviewed the above documentation for accuracy and completeness, and I agree with the above.

## 2017-12-01 ENCOUNTER — Telehealth: Payer: Self-pay | Admitting: Oncology

## 2017-12-01 ENCOUNTER — Inpatient Hospital Stay: Payer: Medicare Other | Attending: Oncology | Admitting: Oncology

## 2017-12-01 ENCOUNTER — Inpatient Hospital Stay: Payer: Medicare Other

## 2017-12-01 VITALS — BP 120/60 | HR 79 | Temp 97.6°F | Resp 18 | Ht 61.5 in | Wt 214.9 lb

## 2017-12-01 DIAGNOSIS — E119 Type 2 diabetes mellitus without complications: Secondary | ICD-10-CM

## 2017-12-01 DIAGNOSIS — Z170 Estrogen receptor positive status [ER+]: Secondary | ICD-10-CM | POA: Diagnosis not present

## 2017-12-01 DIAGNOSIS — G4700 Insomnia, unspecified: Secondary | ICD-10-CM | POA: Diagnosis not present

## 2017-12-01 DIAGNOSIS — Z923 Personal history of irradiation: Secondary | ICD-10-CM

## 2017-12-01 DIAGNOSIS — Z79810 Long term (current) use of selective estrogen receptor modulators (SERMs): Secondary | ICD-10-CM | POA: Diagnosis not present

## 2017-12-01 DIAGNOSIS — Z801 Family history of malignant neoplasm of trachea, bronchus and lung: Secondary | ICD-10-CM | POA: Diagnosis not present

## 2017-12-01 DIAGNOSIS — L659 Nonscarring hair loss, unspecified: Secondary | ICD-10-CM | POA: Diagnosis not present

## 2017-12-01 DIAGNOSIS — C50412 Malignant neoplasm of upper-outer quadrant of left female breast: Secondary | ICD-10-CM | POA: Insufficient documentation

## 2017-12-01 LAB — CBC WITH DIFFERENTIAL (CANCER CENTER ONLY)
BASOS ABS: 0 10*3/uL (ref 0.0–0.1)
BASOS PCT: 0 %
EOS ABS: 0 10*3/uL (ref 0.0–0.5)
EOS PCT: 1 %
HCT: 39.5 % (ref 34.8–46.6)
HEMOGLOBIN: 13.3 g/dL (ref 11.6–15.9)
Lymphocytes Relative: 32 %
Lymphs Abs: 2.1 10*3/uL (ref 0.9–3.3)
MCH: 30.9 pg (ref 25.1–34.0)
MCHC: 33.7 g/dL (ref 31.5–36.0)
MCV: 91.6 fL (ref 79.5–101.0)
Monocytes Absolute: 0.5 10*3/uL (ref 0.1–0.9)
Monocytes Relative: 8 %
NEUTROS PCT: 59 %
Neutro Abs: 3.8 10*3/uL (ref 1.5–6.5)
PLATELETS: 172 10*3/uL (ref 145–400)
RBC: 4.31 MIL/uL (ref 3.70–5.45)
RDW: 12.6 % (ref 11.2–14.5)
WBC: 6.5 10*3/uL (ref 3.9–10.3)

## 2017-12-01 LAB — CMP (CANCER CENTER ONLY)
ALK PHOS: 59 U/L (ref 40–150)
ALT: 17 U/L (ref 0–55)
AST: 20 U/L (ref 5–34)
Albumin: 3.6 g/dL (ref 3.5–5.0)
Anion gap: 8 (ref 3–11)
BILIRUBIN TOTAL: 0.6 mg/dL (ref 0.2–1.2)
BUN: 16 mg/dL (ref 7–26)
CALCIUM: 9.6 mg/dL (ref 8.4–10.4)
CHLORIDE: 105 mmol/L (ref 98–109)
CO2: 26 mmol/L (ref 22–29)
CREATININE: 1.24 mg/dL — AB (ref 0.60–1.10)
GFR, EST AFRICAN AMERICAN: 54 mL/min — AB (ref >60)
GFR, EST NON AFRICAN AMERICAN: 46 mL/min — AB (ref >60)
Glucose, Bld: 174 mg/dL — ABNORMAL HIGH (ref 70–140)
Potassium: 4.3 mmol/L (ref 3.5–5.1)
Sodium: 139 mmol/L (ref 136–145)
TOTAL PROTEIN: 6.4 g/dL (ref 6.4–8.3)

## 2017-12-01 MED ORDER — VENLAFAXINE HCL ER 37.5 MG PO CP24: 37.5000 mg | ORAL_CAPSULE | Freq: Every day | ORAL | 0 refills | Status: DC

## 2017-12-01 MED ORDER — KETOCONAZOLE 2 % EX CREA: 1.0000 "application " | TOPICAL_CREAM | Freq: Every day | CUTANEOUS | 0 refills | Status: DC

## 2017-12-01 MED ORDER — TAMOXIFEN CITRATE 20 MG PO TABS: 20.0000 mg | ORAL_TABLET | Freq: Every day | ORAL | 12 refills | Status: AC

## 2017-12-01 NOTE — Telephone Encounter (Signed)
Gave patient avs and calendar of upcoming April appointments.v

## 2017-12-01 NOTE — Addendum Note (Signed)
Addended by: Chauncey Cruel on: 12/01/2017 03:33 PM   Modules accepted: Orders

## 2017-12-04 DIAGNOSIS — M1991 Primary osteoarthritis, unspecified site: Secondary | ICD-10-CM | POA: Diagnosis not present

## 2017-12-04 DIAGNOSIS — Z0000 Encounter for general adult medical examination without abnormal findings: Secondary | ICD-10-CM | POA: Diagnosis not present

## 2017-12-04 DIAGNOSIS — E782 Mixed hyperlipidemia: Secondary | ICD-10-CM | POA: Diagnosis not present

## 2017-12-04 DIAGNOSIS — E1165 Type 2 diabetes mellitus with hyperglycemia: Secondary | ICD-10-CM | POA: Diagnosis not present

## 2017-12-04 DIAGNOSIS — G894 Chronic pain syndrome: Secondary | ICD-10-CM | POA: Diagnosis not present

## 2017-12-04 DIAGNOSIS — Z0001 Encounter for general adult medical examination with abnormal findings: Secondary | ICD-10-CM | POA: Diagnosis not present

## 2017-12-04 DIAGNOSIS — Z1389 Encounter for screening for other disorder: Secondary | ICD-10-CM | POA: Diagnosis not present

## 2017-12-14 IMAGING — MG BREAST SURGICAL SPECIMEN
1 series · 1 of 1 positions shown · non-contrast
Comparison: Previous exam(s).

CLINICAL DATA: Evaluate surgical specimen following left lumpectomy
for left breast malignancy.

EXAM:
SPECIMEN RADIOGRAPH OF THE LEFT BREAST

[L]
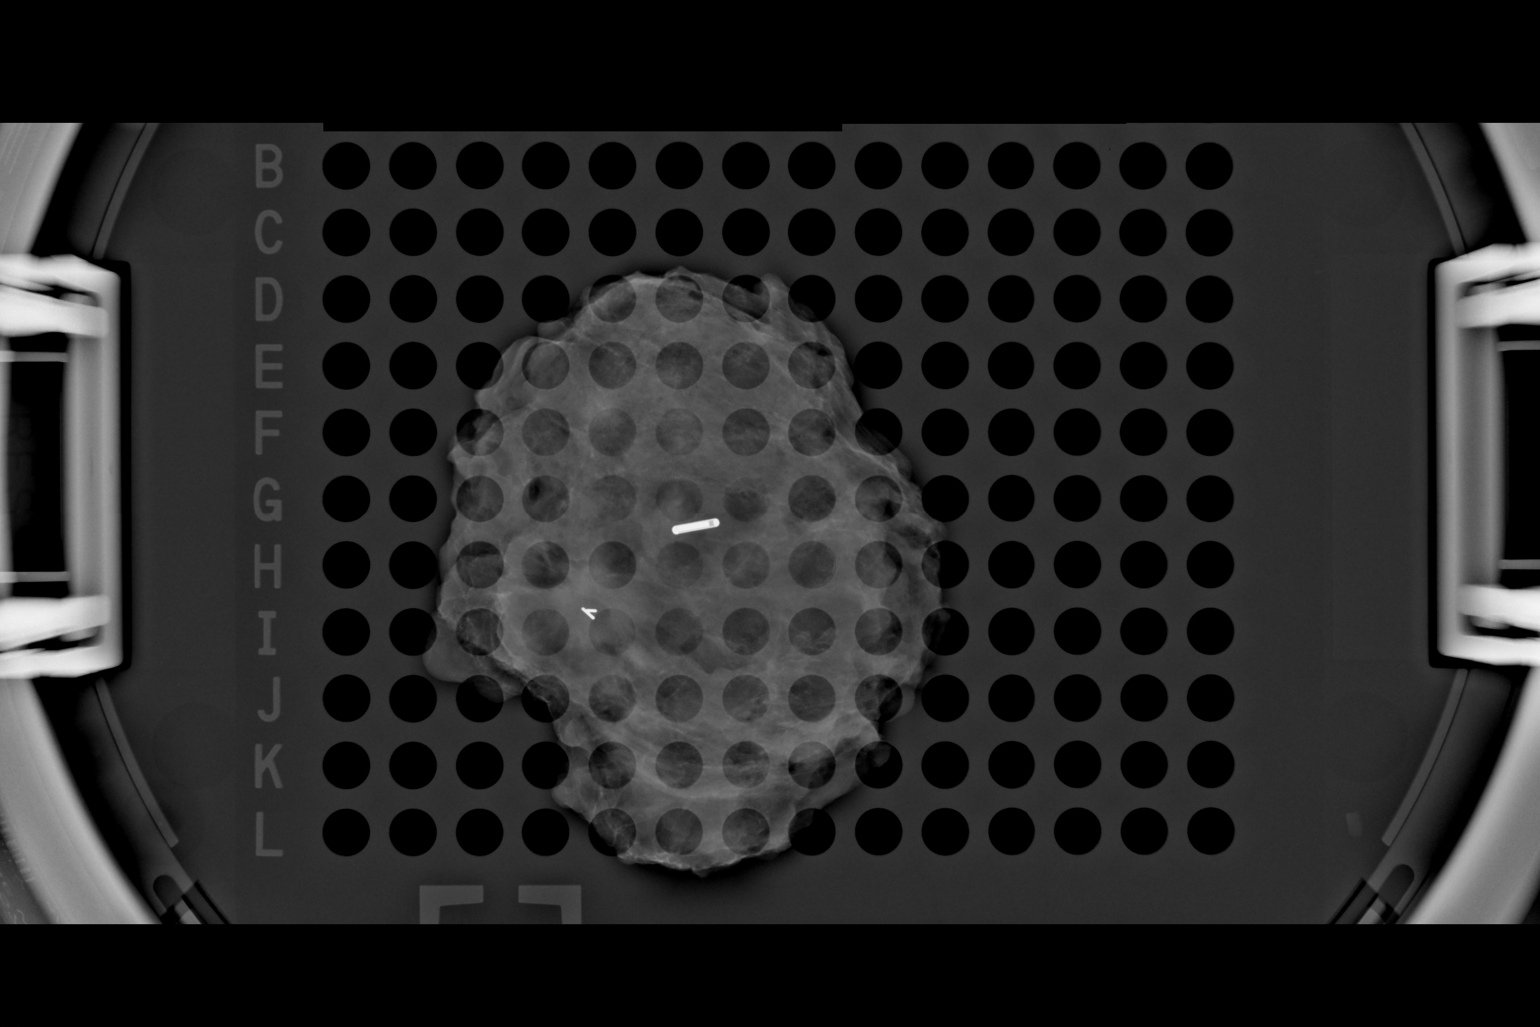

[1 of 1 positions shown; findings below may reference images not displayed]

FINDINGS: Status post excision of the left breast. The radioactive seed and
biopsy marker clip are present, completely intact, and were marked
for pathology.
IMPRESSION: Specimen radiograph of the left breast.

## 2018-01-15 DIAGNOSIS — E118 Type 2 diabetes mellitus with unspecified complications: Secondary | ICD-10-CM | POA: Diagnosis not present

## 2018-02-01 DIAGNOSIS — E119 Type 2 diabetes mellitus without complications: Secondary | ICD-10-CM | POA: Diagnosis not present

## 2018-02-26 DIAGNOSIS — I10 Essential (primary) hypertension: Secondary | ICD-10-CM | POA: Diagnosis not present

## 2018-02-26 DIAGNOSIS — R5383 Other fatigue: Secondary | ICD-10-CM | POA: Diagnosis not present

## 2018-02-26 DIAGNOSIS — E1140 Type 2 diabetes mellitus with diabetic neuropathy, unspecified: Secondary | ICD-10-CM | POA: Diagnosis not present

## 2018-02-26 DIAGNOSIS — G894 Chronic pain syndrome: Secondary | ICD-10-CM | POA: Diagnosis not present

## 2018-02-26 DIAGNOSIS — J329 Chronic sinusitis, unspecified: Secondary | ICD-10-CM | POA: Diagnosis not present

## 2018-03-05 ENCOUNTER — Other Ambulatory Visit: Payer: Self-pay | Admitting: *Deleted

## 2018-03-10 ENCOUNTER — Other Ambulatory Visit: Payer: Self-pay

## 2018-03-10 MED ORDER — VENLAFAXINE HCL ER 37.5 MG PO CP24: 37.5000 mg | ORAL_CAPSULE | Freq: Every day | ORAL | 0 refills | Status: DC

## 2018-04-07 DIAGNOSIS — Z1389 Encounter for screening for other disorder: Secondary | ICD-10-CM | POA: Diagnosis not present

## 2018-04-07 DIAGNOSIS — E1140 Type 2 diabetes mellitus with diabetic neuropathy, unspecified: Secondary | ICD-10-CM | POA: Diagnosis not present

## 2018-04-07 DIAGNOSIS — I10 Essential (primary) hypertension: Secondary | ICD-10-CM | POA: Diagnosis not present

## 2018-04-07 DIAGNOSIS — E063 Autoimmune thyroiditis: Secondary | ICD-10-CM | POA: Diagnosis not present

## 2018-04-27 DIAGNOSIS — E119 Type 2 diabetes mellitus without complications: Secondary | ICD-10-CM | POA: Diagnosis not present

## 2018-04-27 DIAGNOSIS — J329 Chronic sinusitis, unspecified: Secondary | ICD-10-CM | POA: Diagnosis not present

## 2018-05-31 DIAGNOSIS — E1140 Type 2 diabetes mellitus with diabetic neuropathy, unspecified: Secondary | ICD-10-CM | POA: Diagnosis not present

## 2018-05-31 DIAGNOSIS — E538 Deficiency of other specified B group vitamins: Secondary | ICD-10-CM | POA: Diagnosis not present

## 2018-05-31 DIAGNOSIS — G894 Chronic pain syndrome: Secondary | ICD-10-CM | POA: Diagnosis not present

## 2018-05-31 DIAGNOSIS — I10 Essential (primary) hypertension: Secondary | ICD-10-CM | POA: Diagnosis not present

## 2018-06-10 DIAGNOSIS — L84 Corns and callosities: Secondary | ICD-10-CM | POA: Diagnosis not present

## 2018-06-10 DIAGNOSIS — E119 Type 2 diabetes mellitus without complications: Secondary | ICD-10-CM | POA: Diagnosis not present

## 2018-06-10 DIAGNOSIS — E1142 Type 2 diabetes mellitus with diabetic polyneuropathy: Secondary | ICD-10-CM | POA: Diagnosis not present

## 2018-06-18 ENCOUNTER — Other Ambulatory Visit: Payer: Self-pay

## 2018-06-18 MED ORDER — VENLAFAXINE HCL ER 37.5 MG PO CP24: 37.5000 mg | ORAL_CAPSULE | Freq: Every day | ORAL | 0 refills | Status: DC

## 2018-07-19 DIAGNOSIS — I10 Essential (primary) hypertension: Secondary | ICD-10-CM | POA: Diagnosis not present

## 2018-07-19 DIAGNOSIS — Z0001 Encounter for general adult medical examination with abnormal findings: Secondary | ICD-10-CM | POA: Diagnosis not present

## 2018-07-19 DIAGNOSIS — M5416 Radiculopathy, lumbar region: Secondary | ICD-10-CM | POA: Diagnosis not present

## 2018-07-19 DIAGNOSIS — G894 Chronic pain syndrome: Secondary | ICD-10-CM | POA: Diagnosis not present

## 2018-07-19 DIAGNOSIS — Z1389 Encounter for screening for other disorder: Secondary | ICD-10-CM | POA: Diagnosis not present

## 2018-07-19 DIAGNOSIS — E1165 Type 2 diabetes mellitus with hyperglycemia: Secondary | ICD-10-CM | POA: Diagnosis not present

## 2018-07-26 DIAGNOSIS — C50912 Malignant neoplasm of unspecified site of left female breast: Secondary | ICD-10-CM | POA: Diagnosis not present

## 2018-07-26 DIAGNOSIS — Z9012 Acquired absence of left breast and nipple: Secondary | ICD-10-CM | POA: Diagnosis not present

## 2018-08-13 DIAGNOSIS — C50912 Malignant neoplasm of unspecified site of left female breast: Secondary | ICD-10-CM | POA: Diagnosis not present

## 2018-08-13 DIAGNOSIS — G894 Chronic pain syndrome: Secondary | ICD-10-CM | POA: Diagnosis not present

## 2018-08-20 ENCOUNTER — Other Ambulatory Visit: Payer: Self-pay | Admitting: *Deleted

## 2018-08-20 MED ORDER — VENLAFAXINE HCL ER 37.5 MG PO CP24: 37.5000 mg | ORAL_CAPSULE | Freq: Every day | ORAL | 0 refills | Status: DC

## 2018-08-25 DIAGNOSIS — R079 Chest pain, unspecified: Secondary | ICD-10-CM | POA: Diagnosis not present

## 2018-08-25 DIAGNOSIS — J329 Chronic sinusitis, unspecified: Secondary | ICD-10-CM | POA: Diagnosis not present

## 2018-09-09 ENCOUNTER — Telehealth: Payer: Self-pay | Admitting: Oncology

## 2018-09-09 NOTE — Telephone Encounter (Signed)
R/s appt per 3/25 sch message - unable to reach pt left message for patient with appt date and time

## 2018-09-16 ENCOUNTER — Ambulatory Visit: Payer: Medicare Other | Admitting: Oncology

## 2018-09-16 ENCOUNTER — Other Ambulatory Visit: Payer: Medicare Other

## 2018-11-04 DIAGNOSIS — E1140 Type 2 diabetes mellitus with diabetic neuropathy, unspecified: Secondary | ICD-10-CM | POA: Diagnosis not present

## 2018-11-04 DIAGNOSIS — Z1389 Encounter for screening for other disorder: Secondary | ICD-10-CM | POA: Diagnosis not present

## 2018-11-04 DIAGNOSIS — R201 Hypoesthesia of skin: Secondary | ICD-10-CM | POA: Diagnosis not present

## 2018-11-29 ENCOUNTER — Other Ambulatory Visit: Payer: Self-pay

## 2018-11-29 NOTE — Patient Outreach (Signed)
[facility]) Care Management  11/29/2018  [name] 08/03/1955 [id number]   Medication Adherence call to [name] Verify spoke with patient she is past due on Atorvastatin 20 mg patient explain she is taking 1 tablet daily patient only has 3 tablets left but she will call the pharmacy and will order it.[name] is shoeing past due under [place].   [facility] Management Direct Dial [phone]  Fax [phone] [email or web address]

## 2018-12-07 ENCOUNTER — Other Ambulatory Visit: Payer: Self-pay | Admitting: *Deleted

## 2018-12-07 MED ORDER — VENLAFAXINE HCL ER 37.5 MG PO CP24: 37.5000 mg | ORAL_CAPSULE | Freq: Every day | ORAL | 0 refills | Status: DC

## 2018-12-13 DIAGNOSIS — G894 Chronic pain syndrome: Secondary | ICD-10-CM | POA: Diagnosis not present

## 2018-12-13 DIAGNOSIS — Z1389 Encounter for screening for other disorder: Secondary | ICD-10-CM | POA: Diagnosis not present

## 2018-12-13 DIAGNOSIS — M1991 Primary osteoarthritis, unspecified site: Secondary | ICD-10-CM | POA: Diagnosis not present

## 2018-12-13 DIAGNOSIS — L989 Disorder of the skin and subcutaneous tissue, unspecified: Secondary | ICD-10-CM | POA: Diagnosis not present

## 2018-12-13 DIAGNOSIS — M5416 Radiculopathy, lumbar region: Secondary | ICD-10-CM | POA: Diagnosis not present

## 2018-12-31 ENCOUNTER — Other Ambulatory Visit (HOSPITAL_COMMUNITY): Payer: Self-pay | Admitting: Physician Assistant

## 2018-12-31 DIAGNOSIS — Z853 Personal history of malignant neoplasm of breast: Secondary | ICD-10-CM

## 2019-01-04 ENCOUNTER — Other Ambulatory Visit: Payer: Self-pay

## 2019-01-04 ENCOUNTER — Ambulatory Visit (HOSPITAL_COMMUNITY): Admission: RE | Admit: 2019-01-04 | Payer: Medicare Other | Source: Ambulatory Visit

## 2019-01-04 ENCOUNTER — Ambulatory Visit (HOSPITAL_COMMUNITY)
Admission: RE | Admit: 2019-01-04 | Discharge: 2019-01-04 | Disposition: A | Discharge status: Home / Self Care | Payer: Medicare Other | Source: Ambulatory Visit | Attending: Physician Assistant | Admitting: Physician Assistant

## 2019-01-04 ENCOUNTER — Ambulatory Visit (HOSPITAL_COMMUNITY): Payer: Medicare Other

## 2019-01-04 DIAGNOSIS — Z853 Personal history of malignant neoplasm of breast: Secondary | ICD-10-CM | POA: Insufficient documentation

## 2019-01-04 DIAGNOSIS — R928 Other abnormal and inconclusive findings on diagnostic imaging of breast: Secondary | ICD-10-CM | POA: Diagnosis not present

## 2019-01-10 DIAGNOSIS — G894 Chronic pain syndrome: Secondary | ICD-10-CM | POA: Diagnosis not present

## 2019-01-10 DIAGNOSIS — M21372 Foot drop, left foot: Secondary | ICD-10-CM | POA: Diagnosis not present

## 2019-02-07 ENCOUNTER — Other Ambulatory Visit: Payer: Self-pay | Admitting: *Deleted

## 2019-02-07 DIAGNOSIS — C50412 Malignant neoplasm of upper-outer quadrant of left female breast: Secondary | ICD-10-CM

## 2019-02-07 DIAGNOSIS — Z170 Estrogen receptor positive status [ER+]: Secondary | ICD-10-CM

## 2019-02-07 NOTE — Progress Notes (Signed)
[place]  [phone]) [phone] [phone]     ID: [name] DOB: 1955-08-25  MR#: [id number]  MNO#:[id number]  Patient Care Team: Redmond School, MD as PCP - General (Internal Medicine) [name], MD as Consulting Physician (General Surgery) , Laurie Dad, MD as Consulting Physician (Oncology) Laurie Pray, MD as Consulting Physician (Radiation Oncology) Laurie Houston, MD as Consulting Physician (Gastroenterology) [name], Laurie Massed, NP as Nurse Practitioner (Hematology and Oncology)   CHIEF COMPLAINT: Estrogen receptor positive breast cancer  CURRENT TREATMENT: Tamoxifen    BREAST CANCER HISTORY: From the original intake note:  [name] had bilateral screening mammography with tomography at [facility] 08/28/2016. There was a possible mass in the left breast and on 09/16/2016 she was brought back for left diagnostic mammography with tomography and left breast ultrasonography. The left breast density was category B. In the left breast there was an irregular mass with architectural distortion in the upper outer quadrant, which appear to measure approximately 0.8 cm. Thank you by ultrasound this measured 0.9 cm and it was located at the 2:00 axis 7 cm from the nipple. Ultrasound of the axilla was reported as negative.   Biopsy of the left breast mass in question 09/23/2016 showed [facility] [phone]) ductal carcinoma in situ, E-cadherin positive, estrogen receptor 100% positive with strong staining intensity, progesterone receptor 90% positive with moderate staining intensity, with an MIB-1 of 10%, and no HER-2 amplification, signals ratio being 1.05 and the number per cell 1.10.  Her subsequent history is as detailed below   INTERVAL HISTORY: [name] returns today for follow-up and treatment of her estrogen receptor positive breast cancer. She was last seen here on 12/01/2017.   She continues on tamoxifen.  Hot flashes and  vaginal wetness are not a major issue.  She wonders if the pain she has in her leg sometimes might be due to the tamoxifen.  She also feels tired all the time.  These are more likely to be due to her fibromyalgia than the tamoxifen however.  Since her last visit here, she underwent a digital diagnostic bilateral mammogram with tomography on 01/04/2019 showing: Breast Density Category B. There is no mammographic evidence of malignancy.     REVIEW OF SYSTEMS: Elias is not exercising regularly.  Clinically she is very stable and really no big change from last year.  Family is doing well and both her children are now back to work.  Her husband with extensive liver issues is also doing better.  Detailed review of systems today was otherwise stable.    PAST MEDICAL HISTORY: Past Medical History:  Diagnosis Date  . Anxiety   . Asthma   . Cancer [facility])    Left breast  . Complication of anesthesia    pt states she is hard to wake up after anesthetic-"she was told this at [facility]."   . Constipation   . Constipation   . DDD (degenerative disc disease), lumbar   . Depression    situational  . Diabetes mellitus    Type II  . Dyspnea   . Family history of adverse reaction to anesthesia    Brother- N/V  . Fibromyalgia   . GERD (gastroesophageal reflux disease)   . Headache   . History of blood transfusion    with childbirth  . History of radiation therapy 12/01/16-01/08/17   left breast 50.4 Gy in 28 fractions  . Hypertension   . Pneumonia 2008  . PONV (postoperative nausea and vomiting)   .  Stroke [facility])    mini stroke 2008    PAST SURGICAL HISTORY: Past Surgical History:  Procedure Laterality Date  . ABDOMINAL HYSTERECTOMY     Cervical- cancer cell  . ANTERIOR CERVICAL DISCECTOMY  2007  . BACK SURGERY  2009   Laminectomy  . Back surgery 4 yrs ago  2008  . BALLOON DILATION  10/02/2011   Procedure: BALLOON DILATION;  Surgeon: Laurie Houston, MD;  [facility];  Service: Endoscopy;  Laterality: N/A;  . BREAST LUMPECTOMY WITH RADIOACTIVE SEED AND SENTINEL LYMPH NODE BIOPSY Left 10/13/2016   Procedure: BREAST LUMPECTOMY WITH RADIOACTIVE SEED AND SENTINEL LYMPH NODE BIOPSY;  Surgeon: [name], MD;  Location: [place];  Service: General;  Laterality: Left;  . CHOLECYSTECTOMY    . COLONOSCOPY WITH PROPOFOL N/A 04/18/2016   Procedure: COLONOSCOPY WITH PROPOFOL;  Surgeon: Laurie Houston, MD;  [facility];  Service: Endoscopy;  Laterality: N/A;  955  . FOOT SURGERY     tried to remove bone spur- was unable- "just clean"  . MALONEY DILATION  10/02/2011   Procedure: MALONEY DILATION;  Surgeon: Laurie Houston, MD;  [facility];  Service: Endoscopy;  Laterality: N/A;  . SAVORY DILATION  10/02/2011   Procedure: SAVORY DILATION;  Surgeon: Laurie Houston, MD;  [facility];  Service: Endoscopy;  Laterality: N/A;    FAMILY HISTORY Family History  Problem Relation Age of Onset  . Lung cancer Father   The patient's father died at the age of 62, 4 years after being diagnosed with lung cancer in the setting of tobacco abuse. The patient's mother died at the age of 42 from a heart attack. The patient had 6 brothers, 3 sisters. There is no history of breast or ovarian cancer in the family to her knowledge.  GYNECOLOGIC HISTORY:  No LMP recorded. Patient has had a hysterectomy. Menarche age 63, first live birth age 63, the patient is [place] P2. She underwent hysterectomy with unilateral salpingo-oophorectomy at age 63. She used estradiol as hormone replacement until her diagnosis of breast cancer in April 2018.  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 She also has significant fibromyalgia. She is separated but not divorced from her husband [name].  (As of 05/05/2017 they were back living together).  He has significant medical problems. The patient's son Laurie  Pennelope West lives in [place]; he works at [employer]. The patient tells me [name] was married in [place] because it was his husband's dream. She greatly enjoyed the wedding. Son Garth Schlatter lives in [place] and works in a Human resources officer. The patient has no grandchildren. She is a Psychologist, forensic.    ADVANCED DIRECTIVES: Not in place; patient is comfortable having her husband as healthcare power of attorney   HEALTH MAINTENANCE: Social History   Tobacco Use  . Smoking status: Never Smoker  . Smokeless tobacco: Never Used  Substance Use Topics  . Alcohol use: Yes    Alcohol/week: 0.0 standard drinks    Comment: glass of wine- occasional  . Drug use: No     Colonoscopy: November 2017/ Dr. Laural Golden  PAP:  Bone density:   Allergies  Allergen Reactions  . Adhesive [Tape] Other (See Comments)    Tears skin  . Amoxicillin-Pot Clavulanate Nausea And Vomiting  . Codeine Itching  . Penicillins Other (See Comments)    Upset stomach  . Sulfonamide  Derivatives Other (See Comments)    Burning sensation    Current Outpatient Medications  Medication Sig Dispense Refill  . albuterol (PROVENTIL HFA;VENTOLIN HFA) 108 (90 BASE) MCG/ACT inhaler Inhale 2 puffs into the lungs every 6 (six) hours as needed for wheezing or shortness of breath.    . ALPRAZolam (XANAX) 1 MG tablet Take 1 mg by mouth 4 (four) times daily as needed.     Marland Kitchen atenolol (TENORMIN) 25 MG tablet Take 25 mg by mouth daily.     . bisacodyl (DULCOLAX) 5 MG EC tablet Take 10 mg by mouth daily as needed for moderate constipation.    . fluticasone (FLONASE) 50 MCG/ACT nasal spray Place 1 spray into both nostrils daily as needed for allergies or rhinitis.    Marland Kitchen gabapentin (NEURONTIN) 300 MG capsule Take 1 capsule (300 mg total) by mouth at bedtime. 90 capsule 4  . glimepiride (AMARYL) 4 MG tablet Take 4 mg by mouth 2 (two) times daily.    Marland Kitchen ketoconazole (NIZORAL) 2 % cream Apply 1 application topically daily. 15 g 0  . ketoconazole  (NIZORAL) 2 % cream Apply 1 application topically daily. 15 g 0  . lidocaine (LIDODERM) 5 % Place 1 patch onto the skin daily as needed (pain). Remove & Discard patch within 12 hours or as directed by MD     . losartan (COZAAR) 25 MG tablet Take 25 mg by mouth daily.    . meclizine (ANTIVERT) 25 MG tablet     . metFORMIN (GLUCOPHAGE) 1000 MG tablet Take 1,000 mg by mouth 2 (two) times daily.     . methocarbamol (ROBAXIN) 500 MG tablet Take 1 tablet (500 mg total) by mouth every 6 (six) hours as needed for muscle spasms. 20 tablet 0  . montelukast (SINGULAIR) 10 MG tablet Take 10 mg by mouth daily as needed.    . non-metallic deodorant Jethro Poling) MISC Apply 1 application topically daily as needed.    . Oxycodone HCl 10 MG TABS Take 10 mg by mouth every 6 (six) hours as needed (pain).     . prochlorperazine (COMPAZINE) 10 MG tablet Take 1 tablet (10 mg total) by mouth every 6 (six) hours as needed for nausea or vomiting. 30 tablet 0  . terbinafine (LAMISIL) 1 % cream Apply 1 application topically 2 (two) times daily. For feet    . venlafaxine XR (EFFEXOR-XR) 37.5 MG 24 hr capsule Take 1 capsule (37.5 mg total) by mouth daily with breakfast. 90 capsule 0   No current facility-administered medications for this visit.     OBJECTIVE: Morbidly obese white woman in no acute distress  Vitals:   02/08/19 0859  BP: 123/63  Pulse: 88  Resp: 18  Temp: 98 F (36.7 C)  SpO2: 99%    Wt Readings from Last 3 Encounters:  02/08/19 222 lb 5 oz (100.8 kg)  12/01/17 214 lb 14.4 oz (97.5 kg)  08/04/17 217 lb 12.8 oz (98.8 kg)   Body mass index is 41.33 kg/m.      ECOG FS:1 - Symptomatic but completely ambulatory  Sclerae unicteric, EOMs intact Wearing a mask No cervical or supraclavicular adenopathy Lungs no rales or rhonchi Heart regular rate and rhythm Abd soft, nontender, positive bowel sounds MSK no focal spinal tenderness, no upper extremity lymphedema Neuro: nonfocal, well oriented,  appropriate affect Breasts: The right breast is unremarkable.  The left breast is status post lumpectomy and radiation with no evidence of local recurrence.  Both axilla are benign  LAB RESULTS:  CMP     Component Value Date/Time   NA 142 02/08/2019 0830   NA 140 05/05/2017 1431   Laurie 4.2 02/08/2019 0830   Laurie 4.4 05/05/2017 1431   CL 105 02/08/2019 0830   CO2 28 02/08/2019 0830   CO2 26 05/05/2017 1431   GLUCOSE 152 (H) 02/08/2019 0830   GLUCOSE 194 (H) 05/05/2017 1431   BUN 18 02/08/2019 0830   BUN 15.0 05/05/2017 1431   CREATININE 1.19 (H) 02/08/2019 0830   CREATININE 1.2 (H) 05/05/2017 1431   CALCIUM 8.8 (L) 02/08/2019 0830   CALCIUM 9.7 05/05/2017 1431   PROT 6.0 (L) 02/08/2019 0830   PROT 7.3 05/05/2017 1431   ALBUMIN 3.3 (L) 02/08/2019 0830   ALBUMIN 3.8 05/05/2017 1431   AST 26 02/08/2019 0830   AST 39 (H) 05/05/2017 1431   ALT 31 02/08/2019 0830   ALT 51 05/05/2017 1431   ALKPHOS 50 02/08/2019 0830   ALKPHOS 74 05/05/2017 1431   BILITOT 0.6 02/08/2019 0830   BILITOT 0.77 05/05/2017 1431   GFRNONAA 49 (L) 02/08/2019 0830   GFRAA 57 (L) 02/08/2019 0830    No results found for: TOTALPROTELP, ALBUMINELP, A1GS, A2GS, BETS, BETA2SER, GAMS, MSPIKE, SPEI  No results found for: Nils Pyle, [facility]  Lab Results  Component Value Date   WBC 6.8 02/08/2019   NEUTROABS 3.2 02/08/2019   HGB 13.5 02/08/2019   HCT 40.8 02/08/2019   MCV 95.3 02/08/2019   PLT 147 (L) 02/08/2019      Chemistry      Component Value Date/Time   NA 142 02/08/2019 0830   NA 140 05/05/2017 1431   Laurie 4.2 02/08/2019 0830   Laurie 4.4 05/05/2017 1431   CL 105 02/08/2019 0830   CO2 28 02/08/2019 0830   CO2 26 05/05/2017 1431   BUN 18 02/08/2019 0830   BUN 15.0 05/05/2017 1431   CREATININE 1.19 (H) 02/08/2019 0830   CREATININE 1.2 (H) 05/05/2017 1431      Component Value Date/Time   CALCIUM 8.8 (L) 02/08/2019 0830   CALCIUM 9.7 05/05/2017 1431   ALKPHOS 50 02/08/2019 0830    ALKPHOS 74 05/05/2017 1431   AST 26 02/08/2019 0830   AST 39 (H) 05/05/2017 1431   ALT 31 02/08/2019 0830   ALT 51 05/05/2017 1431   BILITOT 0.6 02/08/2019 0830   BILITOT 0.77 05/05/2017 1431       No results found for: LABCA2  No components found for: VHQION629  No results for input(s): INR in the last 168 hours.  Urinalysis    Component Value Date/Time   COLORURINE YELLOW 07/30/2014 1600   APPEARANCEUR CLEAR 07/30/2014 1600   LABSPEC 1.020 07/30/2014 1600   PHURINE 5.5 07/30/2014 1600   GLUCOSEU >1000 (A) 07/30/2014 1600   HGBUR NEGATIVE 07/30/2014 1600   BILIRUBINUR NEGATIVE 07/30/2014 1600   KETONESUR NEGATIVE 07/30/2014 1600   PROTEINUR NEGATIVE 07/30/2014 1600   UROBILINOGEN 0.2 07/30/2014 1600   NITRITE NEGATIVE 07/30/2014 1600   LEUKOCYTESUR NEGATIVE 07/30/2014 1600     STUDIES: No results found.   ELIGIBLE FOR AVAILABLE RESEARCH PROTOCOL: no  ASSESSMENT: 63 y.o. [place] woman status post left breast upper outer quadrant biopsy 09/23/2016 for a clinical T1b N0, [place] IA t invasive ductal carcinoma, grade 1, estrogen and progesterone receptor positive, HER-2 not amplified, with an MIB-1 of 10%  (1) status post left lumpectomy and sentinel lymph node sampling 10/13/2016 for a pT1c pN0, stage IA invasive ductal carcinoma, grade 1,  with negative margins.  (2) Oncotype DX score of 25 predicts a 10 year risk of recurrence outside the breast of 17% if the patient's only systemic therapy is tamoxifen for 5 years  (a) the possible benefit from chemotherapy in the 5% range was discussed. The patient declined adjuvant chemotherapy  (3) adjuvant radiation6/18/18 - 01/08/17 Left Breast// 50.4 Gy in 28 fractions   (4) started anastrozole July 2018; discontinued September 2018 with side effects  (a) bone density 12/01/2016 shows a T score of 1.3 (normal).  (5) started tamoxifen 05/16/2017    PLAN: Mykenna is now a little over 2 years out from definitive  surgery for her breast cancer with no evidence of disease recurrence.  This is very favorable.  She is tolerating tamoxifen generally well.  I do not think any of the symptoms she is complaining about are really due to that medication.  Plan is to continue it for a total of 5 years.  I have advised her to exercise 5 minutes 3 times a day.  She can do that sitting on a chair if she has a video.  She can walk her dog for 5 or 10 minutes if it is a cool day.  We are going to try increasing the venlafaxine to 75 mg a day for the next 2 weeks and she will let us know if that works.  They are taking appropriate pandemic precautions.  He is certainly at high risk  In general though she is doing well from a breast cancer point of view and she will return to see me in 1 year.  She knows to call for any other issues that may develop before then.  , Laurie Dad, MD  02/08/19 9:21 AM Medical Oncology and Hematology [facility] [address] [place], [place] [address] Tel. [phone]    Fax. [phone]  I, Jacqualyn Posey am acting as a Education administrator for Chauncey Cruel, MD.   I, Lurline Del MD, have reviewed the above documentation for accuracy and completeness, and I agree with the above.

## 2019-02-08 ENCOUNTER — Inpatient Hospital Stay (HOSPITAL_BASED_OUTPATIENT_CLINIC_OR_DEPARTMENT_OTHER): Payer: Medicare Other | Admitting: Oncology

## 2019-02-08 ENCOUNTER — Other Ambulatory Visit: Payer: Self-pay

## 2019-02-08 ENCOUNTER — Inpatient Hospital Stay: Payer: Medicare Other | Attending: Oncology

## 2019-02-08 VITALS — BP 123/63 | HR 88 | Temp 98.0°F | Resp 18 | Wt 222.3 lb

## 2019-02-08 DIAGNOSIS — C50412 Malignant neoplasm of upper-outer quadrant of left female breast: Secondary | ICD-10-CM | POA: Diagnosis not present

## 2019-02-08 DIAGNOSIS — F419 Anxiety disorder, unspecified: Secondary | ICD-10-CM | POA: Insufficient documentation

## 2019-02-08 DIAGNOSIS — Z8673 Personal history of transient ischemic attack (TIA), and cerebral infarction without residual deficits: Secondary | ICD-10-CM | POA: Diagnosis not present

## 2019-02-08 DIAGNOSIS — Z7984 Long term (current) use of oral hypoglycemic drugs: Secondary | ICD-10-CM | POA: Diagnosis not present

## 2019-02-08 DIAGNOSIS — Z923 Personal history of irradiation: Secondary | ICD-10-CM | POA: Insufficient documentation

## 2019-02-08 DIAGNOSIS — E119 Type 2 diabetes mellitus without complications: Secondary | ICD-10-CM | POA: Diagnosis not present

## 2019-02-08 DIAGNOSIS — I10 Essential (primary) hypertension: Secondary | ICD-10-CM | POA: Insufficient documentation

## 2019-02-08 DIAGNOSIS — Z170 Estrogen receptor positive status [ER+]: Secondary | ICD-10-CM | POA: Insufficient documentation

## 2019-02-08 DIAGNOSIS — F329 Major depressive disorder, single episode, unspecified: Secondary | ICD-10-CM | POA: Insufficient documentation

## 2019-02-08 DIAGNOSIS — Z79899 Other long term (current) drug therapy: Secondary | ICD-10-CM | POA: Diagnosis not present

## 2019-02-08 DIAGNOSIS — Z79810 Long term (current) use of selective estrogen receptor modulators (SERMs): Secondary | ICD-10-CM | POA: Diagnosis not present

## 2019-02-08 DIAGNOSIS — J45909 Unspecified asthma, uncomplicated: Secondary | ICD-10-CM | POA: Diagnosis not present

## 2019-02-08 LAB — CMP (CANCER CENTER ONLY)
ALT: 31 U/L (ref 0–44)
AST: 26 U/L (ref 15–41)
Albumin: 3.3 g/dL — ABNORMAL LOW (ref 3.5–5.0)
Alkaline Phosphatase: 50 U/L (ref 38–126)
Anion gap: 9 (ref 5–15)
BUN: 18 mg/dL (ref 8–23)
CO2: 28 mmol/L (ref 22–32)
Calcium: 8.8 mg/dL — ABNORMAL LOW (ref 8.9–10.3)
Chloride: 105 mmol/L (ref 98–111)
Creatinine: 1.19 mg/dL — ABNORMAL HIGH (ref 0.44–1.00)
GFR, Est AFR Am: 57 mL/min — ABNORMAL LOW (ref >60)
GFR, Estimated: 49 mL/min — ABNORMAL LOW (ref >60)
Glucose, Bld: 152 mg/dL — ABNORMAL HIGH (ref 70–99)
Potassium: 4.2 mmol/L (ref 3.5–5.1)
Sodium: 142 mmol/L (ref 135–145)
Total Bilirubin: 0.6 mg/dL (ref 0.3–1.2)
Total Protein: 6 g/dL — ABNORMAL LOW (ref 6.5–8.1)

## 2019-02-08 LAB — CBC WITH DIFFERENTIAL (CANCER CENTER ONLY)
Abs Immature Granulocytes: 0.02 10*3/uL (ref 0.00–0.07)
Basophils Absolute: 0 10*3/uL (ref 0.0–0.1)
Basophils Relative: 0 %
Eosinophils Absolute: 0.1 10*3/uL (ref 0.0–0.5)
Eosinophils Relative: 1 %
HCT: 40.8 % (ref 36.0–46.0)
Hemoglobin: 13.5 g/dL (ref 12.0–15.0)
Immature Granulocytes: 0 %
Lymphocytes Relative: 43 %
Lymphs Abs: 2.9 10*3/uL (ref 0.7–4.0)
MCH: 31.5 pg (ref 26.0–34.0)
MCHC: 33.1 g/dL (ref 30.0–36.0)
MCV: 95.3 fL (ref 80.0–100.0)
Monocytes Absolute: 0.6 10*3/uL (ref 0.1–1.0)
Monocytes Relative: 8 %
Neutro Abs: 3.2 10*3/uL (ref 1.7–7.7)
Neutrophils Relative %: 48 %
Platelet Count: 147 10*3/uL — ABNORMAL LOW (ref 150–400)
RBC: 4.28 MIL/uL (ref 3.87–5.11)
RDW: 12.4 % (ref 11.5–15.5)
WBC Count: 6.8 10*3/uL (ref 4.0–10.5)
nRBC: 0 % (ref 0.0–0.2)

## 2019-02-08 MED ORDER — TAMOXIFEN CITRATE 20 MG PO TABS: 20.0000 mg | ORAL_TABLET | Freq: Every day | ORAL | 12 refills | Status: AC

## 2019-02-08 MED ORDER — VENLAFAXINE HCL ER 37.5 MG PO CP24: 37.5000 mg | ORAL_CAPSULE | Freq: Every day | ORAL | 0 refills | Status: DC

## 2019-02-09 ENCOUNTER — Telehealth: Payer: Self-pay | Admitting: Oncology

## 2019-02-09 NOTE — Telephone Encounter (Signed)
I could not reach regarding schedule I will mail °

## 2019-02-11 DIAGNOSIS — E063 Autoimmune thyroiditis: Secondary | ICD-10-CM | POA: Diagnosis not present

## 2019-02-11 DIAGNOSIS — M1991 Primary osteoarthritis, unspecified site: Secondary | ICD-10-CM | POA: Diagnosis not present

## 2019-02-11 DIAGNOSIS — G894 Chronic pain syndrome: Secondary | ICD-10-CM | POA: Diagnosis not present

## 2019-02-11 DIAGNOSIS — E1140 Type 2 diabetes mellitus with diabetic neuropathy, unspecified: Secondary | ICD-10-CM | POA: Diagnosis not present

## 2019-02-11 DIAGNOSIS — I10 Essential (primary) hypertension: Secondary | ICD-10-CM | POA: Diagnosis not present

## 2019-03-02 ENCOUNTER — Other Ambulatory Visit: Payer: Self-pay

## 2019-03-02 NOTE — Patient Outreach (Signed)
[facility]) Care Management  03/02/2019  RAIVEN DATCHER [birth year] [id number]   Medication Adherence call to Mrs. Hampton Compliant Voice message left with a call back number. Mrs. Gormley is showing past due on Atorvastatin 20 mg under [place].   [place] Management Direct Dial [phone]  Fax [phone] [email or web address]

## 2019-03-11 DIAGNOSIS — I10 Essential (primary) hypertension: Secondary | ICD-10-CM | POA: Diagnosis not present

## 2019-03-11 DIAGNOSIS — Z23 Encounter for immunization: Secondary | ICD-10-CM | POA: Diagnosis not present

## 2019-03-11 DIAGNOSIS — M2550 Pain in unspecified joint: Secondary | ICD-10-CM | POA: Diagnosis not present

## 2019-03-11 DIAGNOSIS — G894 Chronic pain syndrome: Secondary | ICD-10-CM | POA: Diagnosis not present

## 2019-03-16 DIAGNOSIS — N183 Chronic kidney disease, stage 3 (moderate): Secondary | ICD-10-CM | POA: Diagnosis not present

## 2019-03-16 DIAGNOSIS — E1129 Type 2 diabetes mellitus with other diabetic kidney complication: Secondary | ICD-10-CM | POA: Diagnosis not present

## 2019-03-16 DIAGNOSIS — G894 Chronic pain syndrome: Secondary | ICD-10-CM | POA: Diagnosis not present

## 2019-03-16 DIAGNOSIS — E782 Mixed hyperlipidemia: Secondary | ICD-10-CM | POA: Diagnosis not present

## 2019-03-16 DIAGNOSIS — I10 Essential (primary) hypertension: Secondary | ICD-10-CM | POA: Diagnosis not present

## 2019-03-30 ENCOUNTER — Other Ambulatory Visit: Payer: Self-pay | Admitting: Dermatology

## 2019-03-30 DIAGNOSIS — L821 Other seborrheic keratosis: Secondary | ICD-10-CM | POA: Diagnosis not present

## 2019-03-30 DIAGNOSIS — C44719 Basal cell carcinoma of skin of left lower limb, including hip: Secondary | ICD-10-CM | POA: Diagnosis not present

## 2019-03-30 DIAGNOSIS — C44612 Basal cell carcinoma of skin of right upper limb, including shoulder: Secondary | ICD-10-CM | POA: Diagnosis not present

## 2019-03-30 DIAGNOSIS — C44712 Basal cell carcinoma of skin of right lower limb, including hip: Secondary | ICD-10-CM | POA: Diagnosis not present

## 2019-03-30 DIAGNOSIS — D229 Melanocytic nevi, unspecified: Secondary | ICD-10-CM | POA: Diagnosis not present

## 2019-04-12 DIAGNOSIS — E119 Type 2 diabetes mellitus without complications: Secondary | ICD-10-CM | POA: Diagnosis not present

## 2019-04-12 DIAGNOSIS — M1991 Primary osteoarthritis, unspecified site: Secondary | ICD-10-CM | POA: Diagnosis not present

## 2019-04-12 DIAGNOSIS — C4491 Basal cell carcinoma of skin, unspecified: Secondary | ICD-10-CM | POA: Diagnosis not present

## 2019-04-12 DIAGNOSIS — I10 Essential (primary) hypertension: Secondary | ICD-10-CM | POA: Diagnosis not present

## 2019-04-12 DIAGNOSIS — G894 Chronic pain syndrome: Secondary | ICD-10-CM | POA: Diagnosis not present

## 2019-04-16 DIAGNOSIS — E1122 Type 2 diabetes mellitus with diabetic chronic kidney disease: Secondary | ICD-10-CM | POA: Diagnosis not present

## 2019-04-16 DIAGNOSIS — Z7984 Long term (current) use of oral hypoglycemic drugs: Secondary | ICD-10-CM | POA: Diagnosis not present

## 2019-04-16 DIAGNOSIS — Z79899 Other long term (current) drug therapy: Secondary | ICD-10-CM | POA: Diagnosis not present

## 2019-04-16 DIAGNOSIS — I129 Hypertensive chronic kidney disease with stage 1 through stage 4 chronic kidney disease, or unspecified chronic kidney disease: Secondary | ICD-10-CM | POA: Diagnosis not present

## 2019-04-26 DIAGNOSIS — M2391 Unspecified internal derangement of right knee: Secondary | ICD-10-CM | POA: Diagnosis not present

## 2019-04-28 DIAGNOSIS — C44719 Basal cell carcinoma of skin of left lower limb, including hip: Secondary | ICD-10-CM | POA: Diagnosis not present

## 2019-04-28 DIAGNOSIS — C44712 Basal cell carcinoma of skin of right lower limb, including hip: Secondary | ICD-10-CM | POA: Diagnosis not present

## 2019-05-10 DIAGNOSIS — G894 Chronic pain syndrome: Secondary | ICD-10-CM | POA: Diagnosis not present

## 2019-05-11 DIAGNOSIS — L080 Pyoderma: Secondary | ICD-10-CM | POA: Diagnosis not present

## 2019-05-11 DIAGNOSIS — L089 Local infection of the skin and subcutaneous tissue, unspecified: Secondary | ICD-10-CM | POA: Diagnosis not present

## 2019-05-16 DIAGNOSIS — E1122 Type 2 diabetes mellitus with diabetic chronic kidney disease: Secondary | ICD-10-CM | POA: Diagnosis not present

## 2019-05-16 DIAGNOSIS — M1991 Primary osteoarthritis, unspecified site: Secondary | ICD-10-CM | POA: Diagnosis not present

## 2019-05-16 DIAGNOSIS — I129 Hypertensive chronic kidney disease with stage 1 through stage 4 chronic kidney disease, or unspecified chronic kidney disease: Secondary | ICD-10-CM | POA: Diagnosis not present

## 2019-06-07 DIAGNOSIS — G894 Chronic pain syndrome: Secondary | ICD-10-CM | POA: Diagnosis not present

## 2019-06-16 DIAGNOSIS — E1122 Type 2 diabetes mellitus with diabetic chronic kidney disease: Secondary | ICD-10-CM | POA: Diagnosis not present

## 2019-06-16 DIAGNOSIS — I129 Hypertensive chronic kidney disease with stage 1 through stage 4 chronic kidney disease, or unspecified chronic kidney disease: Secondary | ICD-10-CM | POA: Diagnosis not present

## 2019-06-16 DIAGNOSIS — Z79899 Other long term (current) drug therapy: Secondary | ICD-10-CM | POA: Diagnosis not present

## 2019-06-16 DIAGNOSIS — Z7984 Long term (current) use of oral hypoglycemic drugs: Secondary | ICD-10-CM | POA: Diagnosis not present

## 2019-06-20 DIAGNOSIS — E1165 Type 2 diabetes mellitus with hyperglycemia: Secondary | ICD-10-CM | POA: Diagnosis not present

## 2019-06-20 DIAGNOSIS — B029 Zoster without complications: Secondary | ICD-10-CM | POA: Diagnosis not present

## 2019-06-20 DIAGNOSIS — E1140 Type 2 diabetes mellitus with diabetic neuropathy, unspecified: Secondary | ICD-10-CM | POA: Diagnosis not present

## 2019-06-21 ENCOUNTER — Other Ambulatory Visit (HOSPITAL_COMMUNITY): Payer: Self-pay | Admitting: Physician Assistant

## 2019-06-21 DIAGNOSIS — M25561 Pain in right knee: Secondary | ICD-10-CM

## 2019-07-05 DIAGNOSIS — M5416 Radiculopathy, lumbar region: Secondary | ICD-10-CM | POA: Diagnosis not present

## 2019-07-05 DIAGNOSIS — Z0000 Encounter for general adult medical examination without abnormal findings: Secondary | ICD-10-CM | POA: Diagnosis not present

## 2019-07-05 DIAGNOSIS — Z1389 Encounter for screening for other disorder: Secondary | ICD-10-CM | POA: Diagnosis not present

## 2019-07-05 DIAGNOSIS — E1165 Type 2 diabetes mellitus with hyperglycemia: Secondary | ICD-10-CM | POA: Diagnosis not present

## 2019-07-05 DIAGNOSIS — M1991 Primary osteoarthritis, unspecified site: Secondary | ICD-10-CM | POA: Diagnosis not present

## 2019-07-17 DIAGNOSIS — N1831 Chronic kidney disease, stage 3a: Secondary | ICD-10-CM | POA: Diagnosis not present

## 2019-07-17 DIAGNOSIS — E1122 Type 2 diabetes mellitus with diabetic chronic kidney disease: Secondary | ICD-10-CM | POA: Diagnosis not present

## 2019-07-17 DIAGNOSIS — M1991 Primary osteoarthritis, unspecified site: Secondary | ICD-10-CM | POA: Diagnosis not present

## 2019-07-17 DIAGNOSIS — I129 Hypertensive chronic kidney disease with stage 1 through stage 4 chronic kidney disease, or unspecified chronic kidney disease: Secondary | ICD-10-CM | POA: Diagnosis not present

## 2019-07-25 ENCOUNTER — Other Ambulatory Visit: Payer: Self-pay | Admitting: *Deleted

## 2019-07-25 MED ORDER — VENLAFAXINE HCL ER 37.5 MG PO CP24: 37.5000 mg | ORAL_CAPSULE | Freq: Every day | ORAL | 0 refills | Status: DC

## 2019-07-28 ENCOUNTER — Other Ambulatory Visit: Payer: Self-pay | Admitting: *Deleted

## 2019-08-05 ENCOUNTER — Other Ambulatory Visit: Payer: Self-pay

## 2019-08-05 ENCOUNTER — Ambulatory Visit (HOSPITAL_COMMUNITY)
Admission: RE | Admit: 2019-08-05 | Discharge: 2019-08-05 | Disposition: A | Discharge status: Home / Self Care | Payer: Medicare Other | Source: Ambulatory Visit | Attending: Internal Medicine | Admitting: Internal Medicine

## 2019-08-05 ENCOUNTER — Other Ambulatory Visit (HOSPITAL_COMMUNITY): Payer: Self-pay | Admitting: Internal Medicine

## 2019-08-05 DIAGNOSIS — R252 Cramp and spasm: Secondary | ICD-10-CM | POA: Diagnosis not present

## 2019-08-05 DIAGNOSIS — R2689 Other abnormalities of gait and mobility: Secondary | ICD-10-CM | POA: Diagnosis not present

## 2019-08-05 DIAGNOSIS — S99921A Unspecified injury of right foot, initial encounter: Secondary | ICD-10-CM

## 2019-08-14 DIAGNOSIS — N1831 Chronic kidney disease, stage 3a: Secondary | ICD-10-CM | POA: Diagnosis not present

## 2019-08-14 DIAGNOSIS — I129 Hypertensive chronic kidney disease with stage 1 through stage 4 chronic kidney disease, or unspecified chronic kidney disease: Secondary | ICD-10-CM | POA: Diagnosis not present

## 2019-08-14 DIAGNOSIS — E1122 Type 2 diabetes mellitus with diabetic chronic kidney disease: Secondary | ICD-10-CM | POA: Diagnosis not present

## 2019-08-14 DIAGNOSIS — M1991 Primary osteoarthritis, unspecified site: Secondary | ICD-10-CM | POA: Diagnosis not present

## 2019-08-27 ENCOUNTER — Ambulatory Visit: Payer: Medicare Other | Attending: Internal Medicine

## 2019-08-27 DIAGNOSIS — Z23 Encounter for immunization: Secondary | ICD-10-CM

## 2019-08-27 NOTE — Progress Notes (Signed)
   Covid-19 Vaccination Clinic  Name:  [name]    MRN: [id number] DOB: 05-Feb-1956  08/27/2019  [name] was observed post Covid-19 immunization for 15 minutes without incident. She was provided with Vaccine Information Sheet and instruction to access the V-Safe system.   [name] was instructed to call 911 with any severe reactions post vaccine: Marland Kitchen Difficulty breathing  . Swelling of face and throat  . A fast heartbeat  . A bad rash all over body  . Dizziness and weakness   Immunizations Administered    Name Date Dose VIS Date Route   Moderna COVID-19 Vaccine 08/27/2019 11:33 AM 0.5 mL 05/17/2019 Intramuscular   Manufacturer: Moderna   Lot: [id number]   [id number]

## 2019-09-12 ENCOUNTER — Other Ambulatory Visit: Payer: Self-pay

## 2019-09-12 ENCOUNTER — Ambulatory Visit: Payer: Medicare Other | Admitting: Neurology

## 2019-09-12 ENCOUNTER — Encounter: Payer: Self-pay | Admitting: Neurology

## 2019-09-12 VITALS — BP 132/81 | HR 91 | Ht 61.5 in | Wt 222.0 lb

## 2019-09-12 DIAGNOSIS — W19XXXS Unspecified fall, sequela: Secondary | ICD-10-CM

## 2019-09-12 DIAGNOSIS — M5440 Lumbago with sciatica, unspecified side: Secondary | ICD-10-CM | POA: Insufficient documentation

## 2019-09-12 DIAGNOSIS — R269 Unspecified abnormalities of gait and mobility: Secondary | ICD-10-CM | POA: Insufficient documentation

## 2019-09-12 DIAGNOSIS — E1142 Type 2 diabetes mellitus with diabetic polyneuropathy: Secondary | ICD-10-CM | POA: Diagnosis not present

## 2019-09-12 DIAGNOSIS — W19XXXA Unspecified fall, initial encounter: Secondary | ICD-10-CM | POA: Insufficient documentation

## 2019-09-12 NOTE — Progress Notes (Signed)
PATIENT: [name] DOB: 1956-02-26  Chief Complaint  Patient presents with  . Gait Problem    She is here with her friend, [name], for unsteady gait. Reports two falls over the last two months. The first fall she felt her legs buckle beneath her. The second fall was caused by a sudden pain in her right knee. She ambulates unassisted.   Marland Kitchen PCP    Redmond School, MD     HISTORICAL  [name] is a 64 year old female, seen in request by her primary care physician Dr. Redmond School for evaluation of gait abnormality, she is accompanied by her friend [name] at today's visit on September 12, 2019.  I have reviewed and summarized the referring note from the referring physician.  She has past medical history of hypertension, hyperlipidemia, diabetes, history of left breast cancer, status post lobectomy followed by radiation therapy, she also has past medical history of obesity, history of lumbar decompression surgery in 2009, continue to complains of low back pain, knee pain, unsteady gait  She began to notice slow worsening low back pain prior to 2009, radiating pain to bilateral lower extremities, eventually had lumbar decompression surgery in 2009 without helping her symptoms much, she continued to have an low back pain, for a while, was on large dose of opiates, currently still taking up to 4 tablets a day, she noticed gradually decline functional ability, gait abnormality, rely on the cart well to the grocery shopping, she often has to use Lidoderm patch to her upper back for symptoms control  About 8 weeks ago, in a standing position, she felt sharp pain in her right knee, then bilateral lower extremity gave out underneath her, she fell, with right fifth toe fracture, still complains of right foot pain, swelling,  She does have history of diabetic peripheral neuropathy, with bilateral foot numbness tingling, she also complains of recent worsening urinary urgency, frequency,  increased low back pain, especially on the right side, radiating pain to right leg  Laboratory evaluations in August 2020: CMP showed mild elevated glucose 152, creatinine of 1.19, CBC showed hemoglobin of 13.5   REVIEW OF SYSTEMS: Full 14 system review of systems performed and notable only for as above All other review of systems were negative.  ALLERGIES: Allergies  Allergen Reactions  . Adhesive [Tape] Other (See Comments)    Tears skin  . Amoxicillin-Pot Clavulanate Nausea And Vomiting  . Codeine Itching  . Penicillins Other (See Comments)    Upset stomach  . Sulfonamide Derivatives Other (See Comments)    Burning sensation    HOME MEDICATIONS: Current Outpatient Medications  Medication Sig Dispense Refill  . acarbose (PRECOSE) 50 MG tablet Take 50 mg by mouth daily.    Marland Kitchen ALPRAZolam (XANAX) 1 MG tablet Take 1 mg by mouth 4 (four) times daily as needed. Reports taking 0.5 tab QHS.    Marland Kitchen atenolol (TENORMIN) 25 MG tablet Take 25 mg by mouth daily.     Marland Kitchen atorvastatin (LIPITOR) 20 MG tablet Take 20 mg by mouth daily.    . Dulaglutide (TRULICITY) 1.5 0000000 SOPN Inject 1.5 mg into the skin once a week.    . furosemide (LASIX) 20 MG tablet Take 20 mg by mouth as needed for edema.    . gabapentin (NEURONTIN) 300 MG capsule Take 300 mg by mouth 3 (three) times daily as needed.    Marland Kitchen glimepiride (AMARYL) 4 MG tablet Take 4 mg by mouth 2 (two) times daily.    Marland Kitchen  lidocaine (LIDODERM) 5 % Place 1 patch onto the skin daily as needed (pain). Remove & Discard patch within 12 hours or as directed by MD     . losartan (COZAAR) 25 MG tablet Take 25 mg by mouth daily.    . metFORMIN (GLUCOPHAGE) 1000 MG tablet Take 1,000 mg by mouth daily.     . Oxycodone HCl 10 MG TABS Take 10 mg by mouth every 6 (six) hours as needed (pain).     . pantoprazole (PROTONIX) 40 MG tablet Take 40 mg by mouth daily.    . tamoxifen (NOLVADEX) 20 MG tablet Take 20 mg by mouth daily.    Marland Kitchen venlafaxine XR (EFFEXOR-XR)  37.5 MG 24 hr capsule Take 1 capsule (37.5 mg total) by mouth daily with breakfast. 90 capsule 0   No current facility-administered medications for this visit.    PAST MEDICAL HISTORY: Past Medical History:  Diagnosis Date  . Anxiety   . Asthma   . Cancer [facility])    Left breast  . Complication of anesthesia    pt states she is hard to wake up after anesthetic-"she was told this at [facility]."   . Constipation   . Constipation   . DDD (degenerative disc disease), lumbar   . Depression    situational  . Diabetes mellitus    Type II  . Dyspnea   . Family history of adverse reaction to anesthesia    Brother- N/V  . Fibromyalgia   . Gait instability   . GERD (gastroesophageal reflux disease)   . Headache   . History of blood transfusion    with childbirth  . History of radiation therapy 12/01/16-01/08/17   left breast 50.4 Gy in 28 fractions  . Hypertension   . Pneumonia 2008  . PONV (postoperative nausea and vomiting)   . Skin cancer   . Stroke [facility])    mini stroke 2008    PAST SURGICAL HISTORY: Past Surgical History:  Procedure Laterality Date  . ABDOMINAL HYSTERECTOMY     Cervical- cancer cell  . ANTERIOR CERVICAL DISCECTOMY  2007  . BACK SURGERY  2009   Laminectomy  . Back surgery 4 yrs ago  2008  . BALLOON DILATION  10/02/2011   Procedure: BALLOON DILATION;  Surgeon: [name], MD;  [facility];  Service: Endoscopy;  Laterality: N/A;  . BREAST LUMPECTOMY WITH RADIOACTIVE SEED AND SENTINEL LYMPH NODE BIOPSY Left 10/13/2016   Procedure: BREAST LUMPECTOMY WITH RADIOACTIVE SEED AND SENTINEL LYMPH NODE BIOPSY;  Surgeon: [name], MD;  Location: [place];  Service: General;  Laterality: Left;  . CHOLECYSTECTOMY    . COLONOSCOPY WITH PROPOFOL N/A 04/18/2016   Procedure: COLONOSCOPY WITH PROPOFOL;  Surgeon: [name], MD;  [facility];  Service: Endoscopy;  Laterality: N/A;  955  . FOOT SURGERY     tried to remove bone  spur- was unable- "just clean"  . MALONEY DILATION  10/02/2011   Procedure: MALONEY DILATION;  Surgeon: [name], MD;  [facility];  Service: Endoscopy;  Laterality: N/A;  . SAVORY DILATION  10/02/2011   Procedure: SAVORY DILATION;  Surgeon: [name], MD;  [facility];  Service: Endoscopy;  Laterality: N/A;    FAMILY HISTORY: Family History  Problem Relation Age of Onset  . Heart attack Mother   . Lung cancer Father     SOCIAL HISTORY: Social History   Socioeconomic History  . Marital status: Married  Spouse name: Not on file  . Number of children: 2  . Years of education: 15  . Highest education level: High school graduate  Occupational History  . Occupation: disabled  Tobacco Use  . Smoking status: Never Smoker  . Smokeless tobacco: Never Used  Substance and Sexual Activity  . Alcohol use: Yes    Alcohol/week: 0.0 standard drinks    Comment: glass of wine- occasional  . Drug use: Never  . Sexual activity: Not on file  Other Topics Concern  . Not on file  Social History Narrative   Lives at home with husband.   Left-handed.   10oz caffeine per day.   Social Determinants of Health   Financial Resource Strain:   . Difficulty of Paying Living Expenses:   Food Insecurity:   . Worried About Charity fundraiser in the Last Year:   . Arboriculturist in the Last Year:   Transportation Needs:   . Film/video editor (Medical):   Marland Kitchen Lack of Transportation (Non-Medical):   Physical Activity:   . Days of Exercise per Week:   . Minutes of Exercise per Session:   Stress:   . Feeling of Stress :   Social Connections:   . Frequency of Communication with Friends and Family:   . Frequency of Social Gatherings with Friends and Family:   . Attends Religious Services:   . Active Member of Clubs or Organizations:   . Attends Archivist Meetings:   Marland Kitchen Marital Status:   Intimate Partner Violence:   . Fear of Current or  Ex-Partner:   . Emotionally Abused:   Marland Kitchen Physically Abused:   . Sexually Abused:      PHYSICAL EXAM   Vitals:   09/12/19 1440  BP: 132/81  Pulse: 91  Weight: 222 lb (100.7 kg)  Height: 5' 1.5" (1.562 m)    Not recorded      Body mass index is 41.27 kg/m.  PHYSICAL EXAMNIATION:  Gen: NAD, conversant, well nourised, well groomed, obese                    Cardiovascular: Regular rate rhythm, no peripheral edema, warm, nontender. Eyes: Conjunctivae clear without exudates or hemorrhage Neck: Supple, no carotid bruits. Pulmonary: Clear to auscultation bilaterally   NEUROLOGICAL EXAM:  MENTAL STATUS: Speech:    Speech is normal; fluent and spontaneous with normal comprehension.  Cognition:     Orientation to time, place and person     Normal recent and remote memory     Normal Attention span and concentration     Normal Language, naming, repeating,spontaneous speech     Fund of knowledge   CRANIAL NERVES: CN II: Visual fields are full to confrontation. Pupils are round equal and briskly reactive to light. CN III, IV, VI: extraocular movement are normal. No ptosis. CN V: Facial sensation is intact to light touch CN VII: Face is symmetric with normal eye closure  CN VIII: Hearing is normal to causal conversation. CN IX, X: Phonation is normal. CN XI: Head turning and shoulder shrug are intact  MOTOR: There is no pronator drift of out-stretched arms. Muscle bulk and tone are normal. Muscle strength is normal.  Right foot swelling, REFLEXES: Reflexes are 1 and symmetric at the biceps, triceps, knees, and absent at ankles. Plantar responses are flexor.  SENSORY: Length dependent decreased to light touch, pinprick and vibratory sensation to ankle level  COORDINATION: There is no trunk or limb  dysmetria noted.  GAIT/STANCE: She needs push-up to get up from seated position, antalgic, hesitate to put weight on the right foot, with right foot swelling, recent right fifth  toe fracture  DIAGNOSTIC DATA (LABS, IMAGING, TESTING) - I reviewed patient records, labs, notes, testing and imaging myself where available.   ASSESSMENT AND PLAN  TRACIA ELLZEY is a 64 y.o. female   Gait abnormality  Multifactorial, include diabetic peripheral neuropathy, obesity, sedentary lifestyle, chronic low back pain,  Will refer her to physical therapy  Worsening low back pain, bilateral feet paresthesia  MRI of lumbar spine lumbar radiculopathy  EMG nerve conduction study for neuropathy  Laboratory evaluations for potential etiology for peripheral neuropathy  Marcial Pacas, M.D. Ph.D.  [facility] [address] [place], [place] [address] Ph: [phone] Fax: [phone]  CC: Redmond School, MD

## 2019-09-13 ENCOUNTER — Telehealth: Payer: Self-pay | Admitting: *Deleted

## 2019-09-13 ENCOUNTER — Telehealth: Payer: Self-pay | Admitting: Neurology

## 2019-09-13 LAB — SEDIMENTATION RATE: Sed Rate: 8 mm/hr (ref 0–40)

## 2019-09-13 LAB — FOLATE: Folate: 8.4 ng/mL (ref >3.0)

## 2019-09-13 LAB — C-REACTIVE PROTEIN: CRP: 4 mg/L (ref 0–10)

## 2019-09-13 LAB — ANA W/REFLEX IF POSITIVE: Anti Nuclear Antibody (ANA): NEGATIVE

## 2019-09-13 LAB — CK: Total CK: 210 U/L — ABNORMAL HIGH (ref 32–182)

## 2019-09-13 LAB — VITAMIN B12: Vitamin B-12: 464 pg/mL (ref 232–1245)

## 2019-09-13 LAB — TSH: TSH: 0.952 u[IU]/mL (ref 0.450–4.500)

## 2019-09-13 NOTE — Telephone Encounter (Signed)
[employer] medicare order sent to GI. No auth they will reach out to the patient to schedule.  

## 2019-09-13 NOTE — Telephone Encounter (Signed)
Called and spoke with patient about lab results per Dr. Krista Blue note. She verbalized understanding.

## 2019-09-13 NOTE — Telephone Encounter (Signed)
-----   Message from Marcial Pacas, MD sent at 09/13/2019  3:14 PM EDT ----- Please call patient laboratory evaluations showed no significant abnormalities

## 2019-09-14 DIAGNOSIS — I129 Hypertensive chronic kidney disease with stage 1 through stage 4 chronic kidney disease, or unspecified chronic kidney disease: Secondary | ICD-10-CM | POA: Diagnosis not present

## 2019-09-14 DIAGNOSIS — N1831 Chronic kidney disease, stage 3a: Secondary | ICD-10-CM | POA: Diagnosis not present

## 2019-09-14 DIAGNOSIS — M1991 Primary osteoarthritis, unspecified site: Secondary | ICD-10-CM | POA: Diagnosis not present

## 2019-09-14 DIAGNOSIS — E1122 Type 2 diabetes mellitus with diabetic chronic kidney disease: Secondary | ICD-10-CM | POA: Diagnosis not present

## 2019-09-15 DIAGNOSIS — E1165 Type 2 diabetes mellitus with hyperglycemia: Secondary | ICD-10-CM | POA: Diagnosis not present

## 2019-09-15 DIAGNOSIS — M1991 Primary osteoarthritis, unspecified site: Secondary | ICD-10-CM | POA: Diagnosis not present

## 2019-09-15 DIAGNOSIS — R201 Hypoesthesia of skin: Secondary | ICD-10-CM | POA: Diagnosis not present

## 2019-09-15 DIAGNOSIS — I10 Essential (primary) hypertension: Secondary | ICD-10-CM | POA: Diagnosis not present

## 2019-09-20 ENCOUNTER — Ambulatory Visit (HOSPITAL_COMMUNITY): Payer: Medicare Other | Attending: Neurology | Admitting: Physical Therapy

## 2019-09-20 ENCOUNTER — Other Ambulatory Visit: Payer: Self-pay

## 2019-09-20 ENCOUNTER — Encounter (HOSPITAL_COMMUNITY): Payer: Self-pay | Admitting: Physical Therapy

## 2019-09-20 DIAGNOSIS — M5440 Lumbago with sciatica, unspecified side: Secondary | ICD-10-CM

## 2019-09-20 DIAGNOSIS — R2689 Other abnormalities of gait and mobility: Secondary | ICD-10-CM | POA: Insufficient documentation

## 2019-09-20 DIAGNOSIS — M6281 Muscle weakness (generalized): Secondary | ICD-10-CM | POA: Insufficient documentation

## 2019-09-20 NOTE — Therapy (Signed)
[name], [place], [address] Phone: [phone]   Fax:  [phone]  Physical Therapy Evaluation  Patient Details  Name: [name] MRN: [id number] Date of Birth: March 27, 1956 Referring Provider (PT): Marcial Pacas MD    Encounter Date: 09/20/2019  PT End of Session - 09/20/19 0916    Visit Number  1    Number of Visits  16    Date for PT Re-Evaluation  11/18/19    Authorization Type  [employer] Medicare no auth req, no visit limit    Authorization Time Period  09/20/19-11/18/19    Progress Note Due on Visit  10    PT Start Time  0815    PT Stop Time  0905    PT Time Calculation (min)  50 min    Activity Tolerance  Patient tolerated treatment well    Behavior During Therapy  [facility] for tasks assessed/performed       Past Medical History:  Diagnosis Date  . Anxiety   . Asthma   . Cancer [facility])    Left breast  . Complication of anesthesia    pt states she is hard to wake up after anesthetic-"she was told this at [facility]."   . Constipation   . Constipation   . DDD (degenerative disc disease), lumbar   . Depression    situational  . Diabetes mellitus    Type II  . Dyspnea   . Family history of adverse reaction to anesthesia    Brother- N/V  . Fibromyalgia   . Gait instability   . GERD (gastroesophageal reflux disease)   . Headache   . History of blood transfusion    with childbirth  . History of radiation therapy 12/01/16-01/08/17   left breast 50.4 Gy in 28 fractions  . Hypertension   . Pneumonia 2008  . PONV (postoperative nausea and vomiting)   . Skin cancer   . Stroke [facility])    mini stroke 2008    Past Surgical History:  Procedure Laterality Date  . ABDOMINAL HYSTERECTOMY     Cervical- cancer cell  . ANTERIOR CERVICAL DISCECTOMY  2007  . BACK SURGERY  2009   Laminectomy  . Back surgery 4 yrs ago  2008  . BALLOON DILATION  10/02/2011   Procedure: BALLOON DILATION;  Surgeon: [name], MD;  [facility];  Service: Endoscopy;  Laterality: N/A;  . BREAST LUMPECTOMY WITH RADIOACTIVE SEED AND SENTINEL LYMPH NODE BIOPSY Left 10/13/2016   Procedure: BREAST LUMPECTOMY WITH RADIOACTIVE SEED AND SENTINEL LYMPH NODE BIOPSY;  Surgeon: [name], MD;  Location: [place];  Service: General;  Laterality: Left;  . CHOLECYSTECTOMY    . COLONOSCOPY WITH PROPOFOL N/A 04/18/2016   Procedure: COLONOSCOPY WITH PROPOFOL;  Surgeon: [name], MD;  [facility];  Service: Endoscopy;  Laterality: N/A;  955  . FOOT SURGERY     tried to remove bone spur- was unable- "just clean"  . MALONEY DILATION  10/02/2011   Procedure: MALONEY DILATION;  Surgeon: [name], MD;  [facility];  Service: Endoscopy;  Laterality: N/A;  . SAVORY DILATION  10/02/2011   Procedure: SAVORY DILATION;  Surgeon: [name], MD;  [facility];  Service: Endoscopy;  Laterality: N/A;    There were no vitals filed for this visit.   Subjective Assessment - 09/20/19 [id number]    Subjective  Patient presents to physical therapy  with complaint of low back pain and history of frequent falls. Patient says she has history of low back pain and reports lumbar fusion in 2009. Patient says she had a recent fall about 2 months ago which jarred her back and made her pain worse. Patient says she has left foot drop, but notes radiating pain into RLE.    Pertinent History  lumbar fusion (thinks L2-L5) in 2009, diabetic neuropathy, vertigo    Limitations  Lifting;Standing;Walking;House hold activities    How long can you stand comfortably?  <5 minutes    How long can you walk comfortably?  1-2 minutes    Patient Stated Goals  feel better    Currently in Pain?  Yes    Pain Score  7     Pain Location  Back    Pain Orientation  Mid;Lower;Posterior    Pain Descriptors / Indicators  Aching    Pain Radiating Towards  bilateral feet    Pain Onset  More than a month ago    Pain  Frequency  Constant    Aggravating Factors   standing, walking, bending    Pain Relieving Factors  laying down    Effect of Pain on Daily Activities  limits         [facility] PT Assessment - 09/20/19 0001      Assessment   Medical Diagnosis  LBP with Sciatica     Referring Provider (PT)  Marcial Pacas MD     Onset Date/Surgical Date  --   chronic    Next MD Visit  10/17/19    Prior Therapy  Yes       Precautions   Precautions  --   Hx of falls, vertigo      Restrictions   Weight Bearing Restrictions  No      Balance Screen   Has the patient fallen in the past 6 months  Yes    How many times?  2    Has the patient had a decrease in activity level because of a fear of falling?   Yes    Is the patient reluctant to leave their home because of a fear of falling?   Yes      [place]  Private residence    Living Arrangements  Spouse/significant other    Type of [place] Access  Level entry    [place]  Two level;Able to live on main level with bedroom/bathroom      Prior Function   Level of Independence  Needs assistance with ADLs   Has husband help with putting on socks, bathing, cooking      Cognition   Overall Cognitive Status  Within Functional Limits for tasks assessed      Observation/Other Assessments   Focus on Therapeutic Outcomes (FOTO)   76% limited       Observation/Other Assessments-Edema    Edema  --   min edema noted about distal BLE      Sensation   Light Touch  Appears Intact      Posture/Postural Control   Posture/Postural Control  Postural limitations    Postural Limitations  Rounded Shoulders;Forward head;Decreased lumbar lordosis;Increased thoracic kyphosis;Flexed trunk      ROM / Strength   AROM / PROM / Strength  AROM;Strength      Strength   Strength Assessment Site  Hip;Knee;Ankle    Right/Left Hip  Right;Left    Right Hip  Flexion  3+/5    Right Hip Extension  3-/5    Right Hip ABduction  3+/5     Left Hip Flexion  3-/5    Left Hip Extension  3+/5    Left Hip ABduction  3-/5    Right/Left Knee  Right;Left    Right Knee Flexion  4-/5    Right Knee Extension  4/5    Left Knee Flexion  3+/5    Left Knee Extension  3+/5    Right/Left Ankle  Right;Left    Right Ankle Dorsiflexion  4+/5    Left Ankle Dorsiflexion  2-/5      Palpation   Palpation comment  Mod tenderness to palpation about bilateral low lumbar paraspinals, LT side of sacrum       Ambulation/Gait   Ambulation/Gait  Yes    Ambulation/Gait Assistance  6: Modified independent (Device/Increase time)    Assistive device  None    Gait Pattern  Step-to pattern;Decreased stance time - left;Decreased step length - left;Decreased stride length;Decreased dorsiflexion - left;Trendelenburg;Antalgic;Trunk flexed    Ambulation Surface  Level;Indoor      Balance   Balance Assessed  Yes      Static Standing Balance   Static Standing Balance -  Activities   Tandam Stance - Right Leg;Tandam Stance - Left Leg    Static Standing - Comment/# of Minutes  8 sec mod sway; 14 sec min sway                Objective measurements completed on examination: See above findings.              PT Education - 09/20/19 0825    Education Details  On evaluation findings, and  POC    Person(s) Educated  Patient    Methods  Explanation    Comprehension  Verbalized understanding       PT Short Term Goals - 09/20/19 0921      PT SHORT TERM GOAL #1   Title  Patient will be independent with initial HEP to improve functional outcomes    Time  4    Period  Weeks    Status  New    Target Date  10/21/19      PT SHORT TERM GOAL #2   Title  Patient will improve FOTO score to <70% to indicate improvement in functional outcomes    Time  4    Period  Weeks    Status  New    Target Date  10/21/19        PT Long Term Goals - 09/20/19 0922      PT LONG TERM GOAL #1   Title  Patient will improve FOTO score to <60% to  indicate improvement in functional outcomes    Time  8    Period  Weeks    Status  New    Target Date  11/18/19      PT LONG TERM GOAL #2   Title  Patient will report at least 50% overall improvement in subjective complaint to indicate improvement in ability to perform ADLs.    Time  8    Period  Weeks    Status  New    Target Date  11/18/19      PT LONG TERM GOAL #3   Title  Patient will be able to maintain tandem stance >30 seconds on BLEs to improve stability and reduce risk for falls    Time  8  Period  Weeks    Status  New    Target Date  11/18/19      PT LONG TERM GOAL #4   Title  Patient will have equal to or > 4/5 MMT throughout BLE (except LT ankle DF) to improve ability to perform functional mobility, stair ambulation and ADLs.    Time  8    Period  Weeks    Status  New    Target Date  11/18/19      PT LONG TERM GOAL #5   Title  Patient will be able to stand at least 15 minutes with back pain not to exceed 3/10 in order to perform ADLs, dressing, grooming, cooking tasks with improved ability    Time  8    Period  Weeks    Status  New    Target Date  11/18/19             Plan - 09/20/19 0918    Clinical Impression Statement  Patient is a 64 y.o. female who presents to physical therapy with complaint of low back pain and history of frequent falls. Patient demonstrates decreased strength, postural abnormality, balance deficits and gait abnormalities which are likely contributing to symptoms of pain and are negatively impacting patient ability to perform ADLs and functional mobility tasks. Patient will benefit from skilled physical therapy services to address these deficits to reduce pain, improve level of function with ADLs, functional mobility tasks, and reduce risk for falls.    Personal Factors and Comorbidities  Time since onset of injury/illness/exacerbation    Examination-Activity Limitations  Bathing;Bed  Mobility;Sleep;Bend;Stairs;Squat;Carry;Stand;Dressing;Transfers;Hygiene/Grooming;Locomotion Level    Examination-Participation Restrictions  Yard Work;Community Activity;Cleaning    Stability/Clinical Decision Making  Stable/Uncomplicated    Clinical Decision Making  Low    Rehab Potential  Fair    PT Frequency  2x / week    PT Duration  8 weeks    PT Treatment/Interventions  Aquatic Therapy;Biofeedback;Cryotherapy;Canalith Repostioning;Electrical Stimulation;Iontophoresis 4mg /ml Dexamethasone;Moist Heat;Traction;Balance training;Manual lymph drainage;Vestibular;Manual techniques;Vasopneumatic Device;Taping;Splinting;Functional mobility training;Therapeutic activities;Therapeutic exercise;Stair training;Gait training;Orthotic Fit/Training;Energy conservation;Joint Manipulations;Dry needling;Patient/family education;DME Instruction;Passive range of motion;Spinal Manipulations;Scar mobilization;Compression bandaging;Neuromuscular re-education;Parrafin;ADLs/Self Care Home Management;Ultrasound;Fluidtherapy    PT Next Visit Plan  Review goals, issue HEP. HEP to include, ab set, glute set, heel slides. Progress core and hip strength on table and progress to satnding activity as tolerated. Manuals PRN for pain and restriciton    PT Home Exercise Plan  Issue at next visit    Consulted and Agree with Plan of Care  Patient       Patient will benefit from skilled therapeutic intervention in order to improve the following deficits and impairments:  Abnormal gait, Decreased endurance, Hypomobility, Impaired sensation, Increased edema, Decreased activity tolerance, Decreased strength, Pain, Decreased balance, Decreased mobility, Difficulty walking, Decreased range of motion, Postural dysfunction, Impaired flexibility, Improper body mechanics  Visit Diagnosis: Bilateral low back pain with sciatica, sciatica laterality unspecified, unspecified chronicity  Muscle weakness (generalized)  Other abnormalities of  gait and mobility     Problem List Patient Active Problem List   Diagnosis Date Noted  . Fall 09/12/2019  . Gait abnormality 09/12/2019  . DM type 2 with diabetic peripheral neuropathy ([place]) 09/12/2019  . Low back pain with sciatica 09/12/2019  . Malignant neoplasm of upper-outer quadrant of left breast in female, estrogen receptor positive ([place]) 09/30/2016  . Hx of colonic polyps 03/27/2016  . Dysphagia 09/03/2011  . Chest pain 01/02/2011  . Edema 01/02/2011  . WOUND INFECTION 10/20/2007  .  [place] DISEASE 10/20/2007  . LAMINECTOMY, LUMBAR, HX OF 10/20/2007   9:27 AM, 09/20/19 Josue Hector PT DPT  Physical Therapist with [facility]  ([phone]) [phone] [place] [address] [place], [place], [address] Phone: [phone]   Fax:  [phone]  Name: [name] MRN: [id number] Date of Birth: 1955/07/24

## 2019-09-21 ENCOUNTER — Telehealth (HOSPITAL_COMMUNITY): Payer: Self-pay | Admitting: Physical Therapy

## 2019-09-21 NOTE — Telephone Encounter (Signed)
S/w patient and she wants to cx this Friday and talk to [place] about AQ on Monday

## 2019-09-23 ENCOUNTER — Ambulatory Visit (HOSPITAL_COMMUNITY): Payer: Medicare Other | Admitting: Physical Therapy

## 2019-09-26 ENCOUNTER — Ambulatory Visit (HOSPITAL_COMMUNITY): Payer: Medicare Other | Admitting: Physical Therapy

## 2019-09-28 ENCOUNTER — Ambulatory Visit: Payer: Medicare Other | Attending: Internal Medicine

## 2019-09-28 DIAGNOSIS — Z23 Encounter for immunization: Secondary | ICD-10-CM

## 2019-09-28 NOTE — Progress Notes (Signed)
   Covid-19 Vaccination Clinic  Name:  [name]    MRN: [id number] DOB: [birth year]  09/28/2019  [name] was observed post Covid-19 immunization for 15 minutes without incident. She was provided with Vaccine Information Sheet and instruction to access the V-Safe system.   [name] was instructed to call 911 with any severe reactions post vaccine: Marland Kitchen Difficulty breathing  . Swelling of face and throat  . A fast heartbeat  . A bad rash all over body  . Dizziness and weakness   Immunizations Administered    Name Date Dose VIS Date Route   Moderna COVID-19 Vaccine 09/28/2019 11:12 AM 0.5 mL 05/17/2019 Intramuscular   Manufacturer: Moderna   Lot: [id number]   [place]:[id number]

## 2019-09-29 ENCOUNTER — Other Ambulatory Visit: Payer: Self-pay

## 2019-09-29 ENCOUNTER — Encounter (HOSPITAL_COMMUNITY): Payer: Self-pay

## 2019-09-29 ENCOUNTER — Ambulatory Visit (HOSPITAL_COMMUNITY): Payer: Medicare Other

## 2019-09-29 DIAGNOSIS — R2689 Other abnormalities of gait and mobility: Secondary | ICD-10-CM

## 2019-09-29 DIAGNOSIS — M5440 Lumbago with sciatica, unspecified side: Secondary | ICD-10-CM | POA: Diagnosis not present

## 2019-09-29 DIAGNOSIS — M6281 Muscle weakness (generalized): Secondary | ICD-10-CM

## 2019-09-29 NOTE — Therapy (Signed)
[place], [place], [address] Phone: [phone]   Fax:  [phone]  Physical Therapy Treatment  Patient Details  Name: [name] MRN: [id number] Date of Birth: July 17, 1955 Referring Provider (PT): Marcial Pacas MD    Encounter Date: 09/29/2019  PT End of Session - 09/29/19 1920    Visit Number  2    Number of Visits  16    Date for PT Re-Evaluation  11/18/19    Authorization Type  UHC Medicare no auth req, no visit limit    Progress Note Due on Visit  10    PT Start Time  1050    PT Stop Time  1130    PT Time Calculation (min)  40 min    Activity Tolerance  Patient tolerated treatment well    Behavior During Therapy  [facility] for tasks assessed/performed       Past Medical History:  Diagnosis Date  . Anxiety   . Asthma   . Cancer [facility])    Left breast  . Complication of anesthesia    pt states she is hard to wake up after anesthetic-"she was told this at [facility]."   . Constipation   . Constipation   . DDD (degenerative disc disease), lumbar   . Depression    situational  . Diabetes mellitus    Type II  . Dyspnea   . Family history of adverse reaction to anesthesia    Brother- N/V  . Fibromyalgia   . Gait instability   . GERD (gastroesophageal reflux disease)   . Headache   . History of blood transfusion    with childbirth  . History of radiation therapy 12/01/16-01/08/17   left breast 50.4 Gy in 28 fractions  . Hypertension   . Pneumonia 2008  . PONV (postoperative nausea and vomiting)   . Skin cancer   . Stroke [facility])    mini stroke 2008    Past Surgical History:  Procedure Laterality Date  . ABDOMINAL HYSTERECTOMY     Cervical- cancer cell  . ANTERIOR CERVICAL DISCECTOMY  2007  . BACK SURGERY  2009   Laminectomy  . Back surgery 4 yrs ago  2008  . BALLOON DILATION  10/02/2011   Procedure: BALLOON DILATION;  Surgeon: [name], MD;  [facility];  Service:  Endoscopy;  Laterality: N/A;  . BREAST LUMPECTOMY WITH RADIOACTIVE SEED AND SENTINEL LYMPH NODE BIOPSY Left 10/13/2016   Procedure: BREAST LUMPECTOMY WITH RADIOACTIVE SEED AND SENTINEL LYMPH NODE BIOPSY;  Surgeon: [name], MD;  Location: [place];  Service: General;  Laterality: Left;  . CHOLECYSTECTOMY    . COLONOSCOPY WITH PROPOFOL N/A 04/18/2016   Procedure: COLONOSCOPY WITH PROPOFOL;  Surgeon: [name], MD;  [facility];  Service: Endoscopy;  Laterality: N/A;  955  . FOOT SURGERY     tried to remove bone spur- was unable- "just clean"  . MALONEY DILATION  10/02/2011   Procedure: MALONEY DILATION;  Surgeon: [name], MD;  [facility];  Service: Endoscopy;  Laterality: N/A;  . SAVORY DILATION  10/02/2011   Procedure: SAVORY DILATION;  Surgeon: [name], MD;  [facility];  Service: Endoscopy;  Laterality: N/A;    There were no vitals filed for this visit.  Subjective Assessment - 09/30/19 0806    Subjective  Pt stated she feels okay this morning, hasn't done a lot today.  Current  pain scale 5/10 feels best while laying down.    Pertinent History  lumbar fusion (thinks L2-L5) in 2009, diabetic neuropathy, vertigo    Currently in Pain?  Yes    Pain Score  5     Pain Location  Back    Pain Orientation  Left    Pain Descriptors / Indicators  Pressure    Pain Type  Chronic pain    Pain Radiating Towards  Bilateral feet    Pain Onset  More than a month ago    Pain Frequency  Constant    Aggravating Factors   standing, walking, bending    Pain Relieving Factors  laying down    Effect of Pain on Daily Activities  limits           [facility] Adult PT Treatment/Exercise - 09/29/19 0001      Exercises   Exercises  Lumbar      Lumbar Exercises: Supine   Ab Set  10 reps;5 seconds    AB Set Limitations  paired wiht exhale    Glut Set  10 reps;5 seconds    Heel Slides  10 reps    Heel Slides Limitations  with ab set    Bridge  10  reps;3 seconds    Other Supine Lumbar Exercises  Deep breathing                           PT Education - 09/30/19 0807    Education Details  Reviewed goals, educated importance of HEP compliance and establlished home exercise program this session.  Pt reports financial concern and may need to drop to 1x/week.  Interested in aquatic therapy and is a member of the [employer].  Reports edema present BLE- educated benefits with compression hose to address edema for pain control and given printout of elastic therapy.    Person(s) Educated  Patient    Methods  Explanation    Comprehension  Verbalized understanding;Returned demonstration       PT Short Term Goals - 09/20/19 0921      PT SHORT TERM GOAL #1   Title  Patient will be independent with initial HEP to improve functional outcomes    Time  4    Period  Weeks    Status  New    Target Date  10/21/19      PT SHORT TERM GOAL #2   Title  Patient will improve FOTO score to <70% to indicate improvement in functional outcomes    Time  4    Period  Weeks    Status  New    Target Date  10/21/19        PT Long Term Goals - 09/20/19 0922      PT LONG TERM GOAL #1   Title  Patient will improve FOTO score to <60% to indicate improvement in functional outcomes    Time  8    Period  Weeks    Status  New    Target Date  11/18/19      PT LONG TERM GOAL #2   Title  Patient will report at least 50% overall improvement in subjective complaint to indicate improvement in ability to perform ADLs.    Time  8    Period  Weeks    Status  New    Target Date  11/18/19      PT LONG TERM GOAL #3   Title  Patient will  be able to maintain tandem stance >30 seconds on BLEs to improve stability and reduce risk for falls    Time  8    Period  Weeks    Status  New    Target Date  11/18/19      PT LONG TERM GOAL #4   Title  Patient will have equal to or > 4/5 MMT throughout BLE (except LT ankle DF) to improve ability to perform  functional mobility, stair ambulation and ADLs.    Time  8    Period  Weeks    Status  New    Target Date  11/18/19      PT LONG TERM GOAL #5   Title  Patient will be able to stand at least 15 minutes with back pain not to exceed 3/10 in order to perform ADLs, dressing, grooming, cooking tasks with improved ability    Time  8    Period  Weeks    Status  New    Target Date  11/18/19            Plan - 09/30/19 0810    Clinical Impression Statement  Reviewed goals, educated importance of HEP compliance and established home exercise program this session.  Session focus on core and proximal strengthening.  Pt with tendency to hold breath during abdominal sets, educated technique pairing abdominal sets with exhalation.  Pt stated due to financial concerns may need to reduce to 1x/week.  Pt stated she does have an interest in aquatic therapy and is a member of the [employer].    Personal Factors and Comorbidities  Time since onset of injury/illness/exacerbation    Examination-Activity Limitations  Bathing;Bed Mobility;Sleep;Bend;Stairs;Squat;Carry;Stand;Dressing;Transfers;Hygiene/Grooming;Locomotion Level    Examination-Participation Restrictions  Yard Work;Community Activity;Cleaning    Stability/Clinical Decision Making  Stable/Uncomplicated    Clinical Decision Making  Low    Rehab Potential  Fair    PT Frequency  2x / week    PT Duration  8 weeks    PT Treatment/Interventions  Aquatic Therapy;Biofeedback;Cryotherapy;Canalith Repostioning;Electrical Stimulation;Iontophoresis 4mg /ml Dexamethasone;Moist Heat;Traction;Balance training;Manual lymph drainage;Vestibular;Manual techniques;Vasopneumatic Device;Taping;Splinting;Functional mobility training;Therapeutic activities;Therapeutic exercise;Stair training;Gait training;Orthotic Fit/Training;Energy conservation;Joint Manipulations;Dry needling;Patient/family education;DME Instruction;Passive range of motion;Spinal Manipulations;Scar  mobilization;Compression bandaging;Neuromuscular re-education;Parrafin;ADLs/Self Care Home Management;Ultrasound;Fluidtherapy    PT Next Visit Plan  Progress core and hip strength on table and progress to satnding activity as tolerated. Manuals PRN for pain and restriciton    PT Home Exercise Plan  09/29/19: ab set, glut set, heel slide with ab set, bridge       Patient will benefit from skilled therapeutic intervention in order to improve the following deficits and impairments:  Abnormal gait, Decreased endurance, Hypomobility, Impaired sensation, Increased edema, Decreased activity tolerance, Decreased strength, Pain, Decreased balance, Decreased mobility, Difficulty walking, Decreased range of motion, Postural dysfunction, Impaired flexibility, Improper body mechanics  Visit Diagnosis: Muscle weakness (generalized)  Other abnormalities of gait and mobility  Bilateral low back pain with sciatica, sciatica laterality unspecified, unspecified chronicity     Problem List Patient Active Problem List   Diagnosis Date Noted  . Fall 09/12/2019  . Gait abnormality 09/12/2019  . DM type 2 with diabetic peripheral neuropathy ([place]) 09/12/2019  . Low back pain with sciatica 09/12/2019  . Malignant neoplasm of upper-outer quadrant of left breast in female, estrogen receptor positive ([place]) 09/30/2016  . Hx of colonic polyps 03/27/2016  . Dysphagia 09/03/2011  . Chest pain 01/02/2011  . Edema 01/02/2011  . WOUND INFECTION 10/20/2007  . [place] DISEASE 10/20/2007  .  LAMINECTOMY, LUMBAR, HX OF 10/20/2007   Ihor Austin, LPTA/CLT; CBIS [phone]  Aldona Lento 09/30/2019, 8:19 AM  [place], [place], [address] Phone: [phone]   Fax:  [phone]  Name: [name] MRN: [id number] Date of Birth: 1955-08-18

## 2019-09-29 NOTE — Patient Instructions (Signed)
Isometric Abdominal    Lying on back with knees bent, tighten stomach by pressing elbows down. Hold 5 seconds. Repeat 10 times per set. Do 1-2 sets per session. Do 2 sessions per day.  http://orth.exer.us/1087   Copyright  VHI. All rights reserved.   Gluteal Sets    Squeeze pelvic floor and hold. Tighten bottom. Hold for 5 seconds. Relax for 5 seconds.  Repeat 10 times. Do 2 times a day.  Copyright  VHI. All rights reserved.   Heel Slide - Beginner    Press small of back into mat, legs bent, feet relaxed on floor. Exhale, slowly slide one heel out as far as possible without raising lower back. Inhale, slowly slide heel in. Repeat for 10 reps each leg.  Copyright  VHI. All rights reserved.

## 2019-10-03 ENCOUNTER — Ambulatory Visit (HOSPITAL_COMMUNITY): Payer: Medicare Other | Admitting: Physical Therapy

## 2019-10-05 ENCOUNTER — Other Ambulatory Visit: Payer: Self-pay

## 2019-10-05 ENCOUNTER — Ambulatory Visit (HOSPITAL_COMMUNITY): Payer: Medicare Other | Admitting: Physical Therapy

## 2019-10-05 ENCOUNTER — Ambulatory Visit
Admission: RE | Admit: 2019-10-05 | Discharge: 2019-10-05 | Disposition: A | Discharge status: Home / Self Care | Payer: Medicare Other | Source: Ambulatory Visit | Attending: Neurology | Admitting: Neurology

## 2019-10-05 ENCOUNTER — Telehealth: Payer: Self-pay | Admitting: Neurology

## 2019-10-05 ENCOUNTER — Telehealth (HOSPITAL_COMMUNITY): Payer: Self-pay | Admitting: Physical Therapy

## 2019-10-05 DIAGNOSIS — R269 Unspecified abnormalities of gait and mobility: Secondary | ICD-10-CM | POA: Diagnosis not present

## 2019-10-05 DIAGNOSIS — W19XXXS Unspecified fall, sequela: Secondary | ICD-10-CM

## 2019-10-05 DIAGNOSIS — E1142 Type 2 diabetes mellitus with diabetic polyneuropathy: Secondary | ICD-10-CM | POA: Diagnosis not present

## 2019-10-05 DIAGNOSIS — M5440 Lumbago with sciatica, unspecified side: Secondary | ICD-10-CM

## 2019-10-05 NOTE — Telephone Encounter (Signed)
pt called to cancel due to she has a sinus infection

## 2019-10-05 NOTE — Telephone Encounter (Signed)
I called patient and LVM to reschedule NCV/EMG due to tech being out of office. Requested patient call back to reschedule.

## 2019-10-06 ENCOUNTER — Ambulatory Visit (HOSPITAL_COMMUNITY): Payer: Medicare Other

## 2019-10-10 ENCOUNTER — Telehealth: Payer: Self-pay | Admitting: Neurology

## 2019-10-10 ENCOUNTER — Ambulatory Visit (HOSPITAL_COMMUNITY): Payer: Medicare Other | Admitting: Physical Therapy

## 2019-10-10 NOTE — Telephone Encounter (Signed)
IMPRESSION: Abnormal MRI scan of the lumbar spine without contrast showing stable changes of posterior fusion from L2-L5 with prominent disc and facet degenerative changes at L1-L2 resulting in mild posterior canal and bilateral foraminal narrowing and at L5-S1 where there is bilateral foraminal narrowing due to facet hypertrophy.  Please call patient, MRI of lumbar showed post surgical change, posterior fusion from L2-L5, with evidence of degenerative changes above and below surgical levels, there was no evidence of spinal cord compression, variable degree of foraminal narrowing.  We will review films at her EMG/NCS visit on May 3rd.

## 2019-10-10 NOTE — Telephone Encounter (Signed)
I called the patient and provided her with the MRI results below. She will keep her pending appt for NCV/EMG on 10/17/19. States she was unable to continue PT at this time due to discomfort. She plans to try to reschedule the sessions.

## 2019-10-12 ENCOUNTER — Encounter (HOSPITAL_COMMUNITY): Payer: Medicare Other

## 2019-10-13 DIAGNOSIS — G894 Chronic pain syndrome: Secondary | ICD-10-CM | POA: Diagnosis not present

## 2019-10-13 DIAGNOSIS — M5416 Radiculopathy, lumbar region: Secondary | ICD-10-CM | POA: Diagnosis not present

## 2019-10-14 DIAGNOSIS — N1831 Chronic kidney disease, stage 3a: Secondary | ICD-10-CM | POA: Diagnosis not present

## 2019-10-14 DIAGNOSIS — M1991 Primary osteoarthritis, unspecified site: Secondary | ICD-10-CM | POA: Diagnosis not present

## 2019-10-14 DIAGNOSIS — I129 Hypertensive chronic kidney disease with stage 1 through stage 4 chronic kidney disease, or unspecified chronic kidney disease: Secondary | ICD-10-CM | POA: Diagnosis not present

## 2019-10-14 DIAGNOSIS — E1122 Type 2 diabetes mellitus with diabetic chronic kidney disease: Secondary | ICD-10-CM | POA: Diagnosis not present

## 2019-10-17 ENCOUNTER — Encounter: Payer: Medicare Other | Admitting: Neurology

## 2019-10-18 ENCOUNTER — Encounter (HOSPITAL_COMMUNITY): Payer: Medicare Other | Admitting: Physical Therapy

## 2019-10-20 ENCOUNTER — Ambulatory Visit (HOSPITAL_COMMUNITY): Payer: Medicare Other | Attending: Neurology | Admitting: Physical Therapy

## 2019-10-20 ENCOUNTER — Encounter (HOSPITAL_COMMUNITY): Payer: Medicare Other

## 2019-10-24 ENCOUNTER — Other Ambulatory Visit: Payer: Self-pay | Admitting: Oncology

## 2019-10-25 ENCOUNTER — Encounter (HOSPITAL_COMMUNITY): Payer: Medicare Other | Admitting: Physical Therapy

## 2019-10-27 ENCOUNTER — Telehealth (HOSPITAL_COMMUNITY): Payer: Self-pay

## 2019-10-27 ENCOUNTER — Ambulatory Visit (HOSPITAL_COMMUNITY): Payer: Medicare Other

## 2019-10-27 NOTE — Telephone Encounter (Signed)
No show, called and spoke to pt who stated she has previously cancelled all apts in May until she returns to MD on 11/08/19.    Ihor Austin, LPTA/CLT; Delana Meyer [phone]

## 2019-11-01 ENCOUNTER — Encounter (HOSPITAL_COMMUNITY): Payer: Medicare Other | Admitting: Physical Therapy

## 2019-11-02 ENCOUNTER — Other Ambulatory Visit: Payer: Self-pay

## 2019-11-02 ENCOUNTER — Telehealth: Payer: Self-pay | Admitting: Neurology

## 2019-11-02 ENCOUNTER — Encounter: Payer: Self-pay | Admitting: *Deleted

## 2019-11-02 ENCOUNTER — Ambulatory Visit (INDEPENDENT_AMBULATORY_CARE_PROVIDER_SITE_OTHER): Payer: Medicare Other | Admitting: Neurology

## 2019-11-02 ENCOUNTER — Ambulatory Visit: Payer: Medicare Other | Admitting: Neurology

## 2019-11-02 DIAGNOSIS — M5416 Radiculopathy, lumbar region: Secondary | ICD-10-CM | POA: Diagnosis not present

## 2019-11-02 DIAGNOSIS — R269 Unspecified abnormalities of gait and mobility: Secondary | ICD-10-CM

## 2019-11-02 DIAGNOSIS — Z0289 Encounter for other administrative examinations: Secondary | ICD-10-CM

## 2019-11-02 DIAGNOSIS — L84 Corns and callosities: Secondary | ICD-10-CM | POA: Diagnosis not present

## 2019-11-02 DIAGNOSIS — R2689 Other abnormalities of gait and mobility: Secondary | ICD-10-CM | POA: Diagnosis not present

## 2019-11-02 DIAGNOSIS — E1142 Type 2 diabetes mellitus with diabetic polyneuropathy: Secondary | ICD-10-CM

## 2019-11-02 DIAGNOSIS — W19XXXS Unspecified fall, sequela: Secondary | ICD-10-CM

## 2019-11-02 DIAGNOSIS — M5440 Lumbago with sciatica, unspecified side: Secondary | ICD-10-CM

## 2019-11-02 NOTE — Progress Notes (Signed)
PATIENT: [name] DOB: August 18, 1955   HISTORICAL  Karita SHANTAYA BLUESTONE is a 64 year old female, seen in request by her primary care physician Dr. Redmond School for evaluation of gait abnormality, she is accompanied by her friend Theodis Sato at today's visit on September 12, 2019.  I have reviewed and summarized the referring note from the referring physician.  She has past medical history of hypertension, hyperlipidemia, diabetes, history of left breast cancer, status post lobectomy followed by radiation therapy, she also has past medical history of obesity, history of lumbar decompression surgery in 2009, continue to complains of low back pain, knee pain, unsteady gait  She began to notice slow worsening low back pain prior to 2009, radiating pain to bilateral lower extremities, eventually had lumbar decompression surgery in 2009 without helping her symptoms much, she continued to have an low back pain, for a while, was on large dose of opiates, currently still taking up to 4 tablets a day, she noticed gradually decline functional ability, gait abnormality, rely on the cart well to the grocery shopping, she often has to use Lidoderm patch to her upper back for symptoms control  About 8 weeks ago, in a standing position, she felt sharp pain in her right knee, then bilateral lower extremity gave out underneath her, she fell, with right fifth toe fracture, still complains of right foot pain, swelling,  She does have history of diabetic peripheral neuropathy, with bilateral foot numbness tingling, she also complains of recent worsening urinary urgency, frequency, increased low back pain, especially on the right side, radiating pain to right leg  Laboratory evaluations in August 2020: CMP showed mild elevated glucose 152, creatinine of 1.19, CBC showed hemoglobin of 13.5   UPDATE Nov 02 2019: Patient return for electrodiagnostic study today, which showed evidence of chronic bilateral lumbosacral  radiculopathy, left worse than right, left side mainly involving left L4, 5 S1, mild degree of right L4 radiculopathy  I personally reviewed MRI lumbar in April 2021, stable changes of posterior fusion from L2-L5 with prominent disc and facet degenerative changes at level above surgery, L1-L2, with mild posterior canal bilateral foraminal narrowing, and at L5-S1, bilateral foraminal narrowing due to facet hypertrophy.  Laboratory evaluation in March 2021 showed normal negative C-reactive protein, ESR, ANA, TSH, folic acid, O84, slight elevated CPK 210  REVIEW OF SYSTEMS: Full 14 system review of systems performed and notable only for as above All other review of systems were negative.  ALLERGIES: Allergies  Allergen Reactions  . Adhesive [Tape] Other (See Comments)    Tears skin  . Amoxicillin-Pot Clavulanate Nausea And Vomiting  . Codeine Itching  . Penicillins Other (See Comments)    Upset stomach  . Sulfonamide Derivatives Other (See Comments)    Burning sensation    HOME MEDICATIONS: Current Outpatient Medications  Medication Sig Dispense Refill  . acarbose (PRECOSE) 50 MG tablet Take 50 mg by mouth daily.    Marland Kitchen ALPRAZolam (XANAX) 1 MG tablet Take 1 mg by mouth 4 (four) times daily as needed. Reports taking 0.5 tab QHS.    Marland Kitchen atenolol (TENORMIN) 25 MG tablet Take 25 mg by mouth daily.     Marland Kitchen atorvastatin (LIPITOR) 20 MG tablet Take 20 mg by mouth daily.    . Dulaglutide (TRULICITY) 1.5 ZY/6.0YT SOPN Inject 1.5 mg into the skin once a week.    . furosemide (LASIX) 20 MG tablet Take 20 mg by mouth as needed for edema.    . gabapentin (NEURONTIN)  300 MG capsule Take 300 mg by mouth 3 (three) times daily as needed.    Marland Kitchen glimepiride (AMARYL) 4 MG tablet Take 4 mg by mouth 2 (two) times daily.    Marland Kitchen lidocaine (LIDODERM) 5 % Place 1 patch onto the skin daily as needed (pain). Remove & Discard patch within 12 hours or as directed by MD     . losartan (COZAAR) 25 MG tablet Take 25 mg by  mouth daily.    . metFORMIN (GLUCOPHAGE) 1000 MG tablet Take 1,000 mg by mouth daily.     . Oxycodone HCl 10 MG TABS Take 10 mg by mouth every 6 (six) hours as needed (pain).     . pantoprazole (PROTONIX) 40 MG tablet Take 40 mg by mouth daily.    . tamoxifen (NOLVADEX) 20 MG tablet Take 20 mg by mouth daily.    Marland Kitchen venlafaxine XR (EFFEXOR-XR) 37.5 MG 24 hr capsule TAKE ONE CAPSULE (37.5MG TOTAL) BY MOUTHDAILY WITH BREAKFAS 90 capsule 0   No current facility-administered medications for this visit.    PAST MEDICAL HISTORY: Past Medical History:  Diagnosis Date  . Anxiety   . Asthma   . Cancer [facility])    Left breast  . Complication of anesthesia    pt states she is hard to wake up after anesthetic-"she was told this at [facility]."   . Constipation   . Constipation   . DDD (degenerative disc disease), lumbar   . Depression    situational  . Diabetes mellitus    Type II  . Dyspnea   . Family history of adverse reaction to anesthesia    Brother- N/V  . Fibromyalgia   . Gait instability   . GERD (gastroesophageal reflux disease)   . Headache   . History of blood transfusion    with childbirth  . History of radiation therapy 12/01/16-01/08/17   left breast 50.4 Gy in 28 fractions  . Hypertension   . Pneumonia 2008  . PONV (postoperative nausea and vomiting)   . Skin cancer   . Stroke [facility])    mini stroke 2008    PAST SURGICAL HISTORY: Past Surgical History:  Procedure Laterality Date  . ABDOMINAL HYSTERECTOMY     Cervical- cancer cell  . ANTERIOR CERVICAL DISCECTOMY  2007  . BACK SURGERY  2009   Laminectomy  . Back surgery 4 yrs ago  2008  . BALLOON DILATION  10/02/2011   Procedure: BALLOON DILATION;  Surgeon: [name], MD;  [facility];  Service: Endoscopy;  Laterality: N/A;  . BREAST LUMPECTOMY WITH RADIOACTIVE SEED AND SENTINEL LYMPH NODE BIOPSY Left 10/13/2016   Procedure: BREAST LUMPECTOMY WITH RADIOACTIVE SEED AND SENTINEL LYMPH NODE  BIOPSY;  Surgeon: [name], MD;  Location: [facility];  Service: General;  Laterality: Left;  . CHOLECYSTECTOMY    . COLONOSCOPY WITH PROPOFOL N/A 04/18/2016   Procedure: COLONOSCOPY WITH PROPOFOL;  Surgeon: [name], MD;  [facility];  Service: Endoscopy;  Laterality: N/A;  955  . FOOT SURGERY     tried to remove bone spur- was unable- "just clean"  . MALONEY DILATION  10/02/2011   Procedure: MALONEY DILATION;  Surgeon: [name], MD;  [facility];  Service: Endoscopy;  Laterality: N/A;  . SAVORY DILATION  10/02/2011   Procedure: SAVORY DILATION;  Surgeon: [name], MD;  [facility];  Service: Endoscopy;  Laterality: N/A;    FAMILY HISTORY: Family History  Problem Relation Age of Onset  . Heart attack Mother   . Lung cancer Father     SOCIAL HISTORY: Social History   Socioeconomic History  . Marital status: Married    Spouse name: Not on file  . Number of children: 2  . Years of education: 91  . Highest education level: High school graduate  Occupational History  . Occupation: disabled  Tobacco Use  . Smoking status: Never Smoker  . Smokeless tobacco: Never Used  Substance and Sexual Activity  . Alcohol use: Yes    Alcohol/week: 0.0 standard drinks    Comment: glass of wine- occasional  . Drug use: Never  . Sexual activity: Not on file  Other Topics Concern  . Not on file  Social History Narrative   Lives at home with husband.   Left-handed.   10oz caffeine per day.   Social Determinants of Health   Financial Resource Strain:   . Difficulty of Paying Living Expenses:   Food Insecurity:   . Worried About Charity fundraiser in the Last Year:   . Arboriculturist in the Last Year:   Transportation Needs:   . Film/video editor (Medical):   Marland Kitchen Lack of Transportation (Non-Medical):   Physical Activity:   . Days of Exercise per Week:   . Minutes of Exercise per Session:   Stress:   . Feeling of Stress :    Social Connections:   . Frequency of Communication with Friends and Family:   . Frequency of Social Gatherings with Friends and Family:   . Attends Religious Services:   . Active Member of Clubs or Organizations:   . Attends Archivist Meetings:   Marland Kitchen Marital Status:   Intimate Partner Violence:   . Fear of Current or Ex-Partner:   . Emotionally Abused:   Marland Kitchen Physically Abused:   . Sexually Abused:      PHYSICAL EXAM   There were no vitals filed for this visit.  Not recorded      There is no height or weight on file to calculate BMI.  PHYSICAL EXAMNIATION:  Gen: NAD, conversant, well nourised, well groomed, obese            NEUROLOGICAL EXAM:  MENTAL STATUS: Speech:    Speech is normal; fluent and spontaneous with normal comprehension.  Cognition:     Orientation to time, place and person     Normal recent and remote memory     Normal Attention span and concentration     Normal Language, naming, repeating,spontaneous speech     Fund of knowledge   CRANIAL NERVES: CN II: Visual fields are full to confrontation. Pupils are round equal and briskly reactive to light. CN III, IV, VI: extraocular movement are normal. No ptosis. CN V: Facial sensation is intact to light touch CN VII: Face is symmetric with normal eye closure  CN VIII: Hearing is normal to causal conversation. CN IX, X: Phonation is normal. CN XI: Head turning and shoulder shrug are intact  MOTOR: Upper extremity motor strength is normal, she has moderate left ankle dorsiflexion weakness  REFLEXES: Reflexes are 1 and symmetric at the biceps, triceps, knees, and absent at ankles. Plantar responses are flexor.  SENSORY: Length dependent decreased to light touch, pinprick and vibratory sensation to ankle level  COORDINATION: There is no trunk or limb dysmetria noted.  GAIT/STANCE: She needs push-up to get up from seated position, left foot drop  DIAGNOSTIC DATA (LABS,  IMAGING, TESTING) -  I reviewed patient records, labs, notes, testing and imaging myself where available.   ASSESSMENT AND PLAN  TROY HARTZOG is a 64 y.o. female   Gait abnormality Chronic low back pain  Multifactorial, include diabetic peripheral neuropathy, obesity, sedentary lifestyle, chronic low back pain, left foot drop, residual deficit from previous lumbar spinal stenosis,  She would benefit left AFO, physical therapy,   Marcial Pacas, M.D. Ph.D.  [facility] [address], [place], [place] [address] Ph: [phone] Fax: [phone]  CC: Redmond School, MD

## 2019-11-02 NOTE — Telephone Encounter (Signed)
[facility] [place] has called asking for a prescription on the letterhead of provider with NPI for what pt needs.  [phone] ph# [phone]

## 2019-11-02 NOTE — Telephone Encounter (Signed)
Pt needs left AFO brace. Orders have been printed out on letterhead, signed by MD and faxed back to pharmacy.

## 2019-11-03 ENCOUNTER — Ambulatory Visit (HOSPITAL_COMMUNITY): Payer: Medicare Other | Admitting: Physical Therapy

## 2019-11-03 ENCOUNTER — Encounter (HOSPITAL_COMMUNITY): Payer: Medicare Other | Admitting: Physical Therapy

## 2019-11-05 NOTE — Procedures (Signed)
Full Name: Jeniene Grondahl Gender: Female MRN #: [id number] Date of Birth: [birth year]    Visit Date: 11/02/2019 07:23 Age: 64 Years Examining Physician: Marcial Pacas, MD  Referring Physician: Marcial Pacas, MD Height: 5 feet 1 inch History:   64 years old female, complains of gradual onset gait abnormality  Summary of the tests:  Nerve conduction study:  Bilateral sural sensory response showed mildly decreased snap amplitude, right sural sensory responses were normal.  Left sural sensory response showed significantly decreased snap amplitude.  Right ulnar sensory and motor responses were normal.  Bilateral tibial, right peroneal to EDB motor responses were normal.  Left peroneal to EDB motor response showed mildly decreased CMAP amplitude.  Electromyography:  Selected needle examination of bilateral lower extremity muscles and bilateral lumbosacral paraspinal muscles were performed.    There was evidence of chronic neuropathic changes involving left L4-L5, S1 myotomes.  There was also noticeable increased insertional activity at midline lower lumbar paraspinal muscles.  There was a milder degree chronic neuropathic changes involving right L4 myotomes.  Conclusion: This is an abnormal study.  There is electrodiagnostic evidence of chronic left lumbar sacral radiculopathy, mainly involving the left L4, L5, S1 myotomes, milder degree on the right side, mainly involving right L4 myotomes.  In addition, there is evidence of mild length dependent axonal sensorimotor polyneuropathy.   ------------------------------- Marcial Pacas, M.D. PhD,  [facility] [place], [place] [address] Tel: [phone] Fax: [phone]  Verbal informed consent was obtained from the patient, patient was informed of potential risk of procedure, including bruising, bleeding, hematoma formation, infection, muscle weakness, muscle pain, numbness, among others.         [place]      Nerve / Sites Muscle Latency Ref. Amplitude Ref. Rel Amp Segments Distance Velocity Ref. Area    ms ms mV mV %  cm m/s m/s mVms  R Ulnar - ADM     Wrist ADM 2.6 ?3.3 11.4 ?6.0 100 Wrist - ADM 7   29.6     B.Elbow ADM 5.9  9.0  78.8 B.Elbow - Wrist 19 57 ?49 24.6     A.Elbow ADM 7.9  8.6  95 A.Elbow - B.Elbow 10 52 ?49 25.0         A.Elbow - Wrist      R Peroneal - EDB     Ankle EDB 4.0 ?6.5 4.2 ?2.0 100 Ankle - EDB 9   13.8     Fib head EDB 10.4  1.1  27.1 Fib head - Ankle 25 39 ?44 3.6     Pop fossa EDB 13.0  0.3  26.2 Pop fossa - Fib head 10 38 ?44 0.9         Pop fossa - Ankle      L Peroneal - EDB     Ankle EDB 4.8 ?6.5 1.4 ?2.0 100 Ankle - EDB 9   3.3     Fib head EDB 11.8  0.9  59.4 Fib head - Ankle 26 37 ?44 2.4     Pop fossa EDB 14.4  1.0  115 Pop fossa - Fib head 10 39 ?44 3.1         Pop fossa - Ankle      R Tibial - AH     Ankle AH 3.9 ?5.8 9.6 ?4.0 100 Ankle - AH 9   24.4     Pop fossa AH 12.0  6.4  66.7 Pop fossa -  Ankle 33 41 ?41 21.4  L Tibial - AH     Ankle AH 3.6 ?5.8 9.4 ?4.0 100 Ankle - AH 9   25.1     Pop fossa AH 11.6  9.3  98.8 Pop fossa - Ankle 33 41 ?41 28.8                SNC    Nerve / Sites Rec. Site Peak Lat Ref.  Amp Ref. Segments Distance    ms ms V V  cm  R Sural - Ankle (Calf)     Calf Ankle 3.3 ?4.4 4 ?6 Calf - Ankle 14  L Sural - Ankle (Calf)     Calf Ankle 3.5 ?4.4 3 ?6 Calf - Ankle 14  R Superficial peroneal - Ankle     Lat leg Ankle 4.4 ?4.4 6 ?6 Lat leg - Ankle 14  L Superficial peroneal - Ankle     Lat leg Ankle 3.8 ?4.4 1 ?6 Lat leg - Ankle 14  R Ulnar - Orthodromic, (Dig V, Mid palm)     Dig V Wrist 2.6 ?3.1 5 ?5 Dig V - Wrist 65               F  Wave    Nerve F Lat Ref.   ms ms  R Tibial - AH 50.6 ?56.0  L Tibial - AH 52.5 ?56.0  R Ulnar - ADM 26.6 ?32.0           EMG Summary Table    Spontaneous MUAP Recruitment  Muscle IA Fib PSW Fasc Other Amp Dur. Poly Pattern  L. Vastus lateralis Normal None None None _______ Normal  Normal Normal Reduced  L. Tibialis anterior Increased 1+ None None _______ Increased Increased 1+ Reduced  L. Tibialis posterior Increased None None None _______ Increased Increased Normal Reduced  L. Gastrocnemius (Medial head) Increased None None None _______ Normal Normal Normal Reduced  L. Peroneus longus Increased 1+ 1+ None _______ Increased Increased 1+ Reduced  L. Biceps femoris (short head) Normal None None None _______ Increased Normal Normal Reduced  L. Biceps femoris (long head) Normal None None None _______ Normal Normal Normal Normal  L. Lumbar paraspinals (mid) Increased None None None _______ Normal Normal Normal Normal  L. Lumbar paraspinals (low) Increased None None None _______ Normal Normal Normal Normal  R. Tibialis anterior Normal None None None _______ Normal Normal Normal Reduced  R. Tibialis posterior Normal None None None _______ Normal Normal Normal Normal  R. Gastrocnemius (Medial head) Normal None None None _______ Normal Normal Normal Normal  R. Peroneus longus Normal None None None _______ Normal Normal Normal Normal  R. Vastus lateralis Normal None None None _______ Normal Normal Normal Normal  R. Gluteus maximus Normal None None None _______ Normal Normal Normal Normal  R. Lumbar paraspinals (mid) Normal None None None _______ Normal Normal Normal Normal  R. Lumbar paraspinals (low) Normal None None None _______ Normal Normal Normal Normal

## 2019-11-08 ENCOUNTER — Encounter (HOSPITAL_COMMUNITY): Payer: Medicare Other | Admitting: Physical Therapy

## 2019-11-10 ENCOUNTER — Encounter (HOSPITAL_COMMUNITY): Payer: Medicare Other

## 2019-11-10 DIAGNOSIS — G629 Polyneuropathy, unspecified: Secondary | ICD-10-CM | POA: Diagnosis not present

## 2019-11-10 DIAGNOSIS — E1140 Type 2 diabetes mellitus with diabetic neuropathy, unspecified: Secondary | ICD-10-CM | POA: Diagnosis not present

## 2019-11-10 DIAGNOSIS — G894 Chronic pain syndrome: Secondary | ICD-10-CM | POA: Diagnosis not present

## 2019-11-14 DIAGNOSIS — I129 Hypertensive chronic kidney disease with stage 1 through stage 4 chronic kidney disease, or unspecified chronic kidney disease: Secondary | ICD-10-CM | POA: Diagnosis not present

## 2019-11-14 DIAGNOSIS — N1831 Chronic kidney disease, stage 3a: Secondary | ICD-10-CM | POA: Diagnosis not present

## 2019-11-14 DIAGNOSIS — E1122 Type 2 diabetes mellitus with diabetic chronic kidney disease: Secondary | ICD-10-CM | POA: Diagnosis not present

## 2019-11-14 DIAGNOSIS — M1991 Primary osteoarthritis, unspecified site: Secondary | ICD-10-CM | POA: Diagnosis not present

## 2019-11-15 ENCOUNTER — Encounter (HOSPITAL_COMMUNITY): Payer: Medicare Other | Admitting: Physical Therapy

## 2019-11-17 ENCOUNTER — Encounter (HOSPITAL_COMMUNITY): Payer: Medicare Other | Admitting: Physical Therapy

## 2019-11-17 ENCOUNTER — Ambulatory Visit (HOSPITAL_COMMUNITY): Payer: Medicare Other | Attending: Neurology | Admitting: Physical Therapy

## 2019-11-17 ENCOUNTER — Telehealth (HOSPITAL_COMMUNITY): Payer: Self-pay | Admitting: Physical Therapy

## 2019-11-17 NOTE — Telephone Encounter (Signed)
Called patient and left message about missed appointment. Reminded of upcoming appointment and to call if needing to cancel or reschedule.   6:04 PM, 11/17/19 Josue Hector PT DPT  Physical Therapist with [facility]  [phone]

## 2019-11-23 ENCOUNTER — Telehealth (HOSPITAL_COMMUNITY): Payer: Self-pay | Admitting: Physical Therapy

## 2019-11-23 NOTE — Telephone Encounter (Signed)
pt called to cx the appt for thursday stated that she will not be coming back due to her nerve damage. Please discharge.

## 2019-11-24 ENCOUNTER — Ambulatory Visit (HOSPITAL_COMMUNITY): Payer: Medicare Other | Admitting: Physical Therapy

## 2019-12-03 ENCOUNTER — Emergency Department (HOSPITAL_COMMUNITY)
Admission: EM | Admit: 2019-12-03 | Discharge: 2019-12-03 | Disposition: A | Discharge status: Home / Self Care | Payer: Medicare Other | Attending: Emergency Medicine | Admitting: Emergency Medicine

## 2019-12-03 ENCOUNTER — Other Ambulatory Visit: Payer: Self-pay

## 2019-12-03 ENCOUNTER — Encounter (HOSPITAL_COMMUNITY): Payer: Self-pay | Admitting: Emergency Medicine

## 2019-12-03 DIAGNOSIS — Z79899 Other long term (current) drug therapy: Secondary | ICD-10-CM | POA: Insufficient documentation

## 2019-12-03 DIAGNOSIS — Z880 Allergy status to penicillin: Secondary | ICD-10-CM | POA: Diagnosis not present

## 2019-12-03 DIAGNOSIS — I10 Essential (primary) hypertension: Secondary | ICD-10-CM | POA: Diagnosis not present

## 2019-12-03 DIAGNOSIS — R1084 Generalized abdominal pain: Secondary | ICD-10-CM | POA: Diagnosis present

## 2019-12-03 DIAGNOSIS — Z885 Allergy status to narcotic agent status: Secondary | ICD-10-CM | POA: Insufficient documentation

## 2019-12-03 DIAGNOSIS — R112 Nausea with vomiting, unspecified: Secondary | ICD-10-CM | POA: Diagnosis not present

## 2019-12-03 DIAGNOSIS — Z7984 Long term (current) use of oral hypoglycemic drugs: Secondary | ICD-10-CM | POA: Diagnosis not present

## 2019-12-03 DIAGNOSIS — Z882 Allergy status to sulfonamides status: Secondary | ICD-10-CM | POA: Insufficient documentation

## 2019-12-03 DIAGNOSIS — R197 Diarrhea, unspecified: Secondary | ICD-10-CM | POA: Diagnosis not present

## 2019-12-03 DIAGNOSIS — E1140 Type 2 diabetes mellitus with diabetic neuropathy, unspecified: Secondary | ICD-10-CM | POA: Diagnosis not present

## 2019-12-03 LAB — COMPREHENSIVE METABOLIC PANEL
ALT: 47 U/L — ABNORMAL HIGH (ref 0–44)
AST: 45 U/L — ABNORMAL HIGH (ref 15–41)
Albumin: 3.7 g/dL (ref 3.5–5.0)
Alkaline Phosphatase: 53 U/L (ref 38–126)
Anion gap: 10 (ref 5–15)
BUN: 17 mg/dL (ref 8–23)
CO2: 25 mmol/L (ref 22–32)
Calcium: 8.7 mg/dL — ABNORMAL LOW (ref 8.9–10.3)
Chloride: 101 mmol/L (ref 98–111)
Creatinine, Ser: 1.06 mg/dL — ABNORMAL HIGH (ref 0.44–1.00)
GFR calc Af Amer: >60 mL/min (ref >60)
GFR calc non Af Amer: 56 mL/min — ABNORMAL LOW (ref >60)
Glucose, Bld: 219 mg/dL — ABNORMAL HIGH (ref 70–99)
Potassium: 3.6 mmol/L (ref 3.5–5.1)
Sodium: 136 mmol/L (ref 135–145)
Total Bilirubin: 1 mg/dL (ref 0.3–1.2)
Total Protein: 6.9 g/dL (ref 6.5–8.1)

## 2019-12-03 LAB — CBC
HCT: 44.6 % (ref 36.0–46.0)
Hemoglobin: 14.8 g/dL (ref 12.0–15.0)
MCH: 31.2 pg (ref 26.0–34.0)
MCHC: 33.2 g/dL (ref 30.0–36.0)
MCV: 93.9 fL (ref 80.0–100.0)
Platelets: 136 10*3/uL — ABNORMAL LOW (ref 150–400)
RBC: 4.75 MIL/uL (ref 3.87–5.11)
RDW: 13.1 % (ref 11.5–15.5)
WBC: 5.4 10*3/uL (ref 4.0–10.5)
nRBC: 0 % (ref 0.0–0.2)

## 2019-12-03 LAB — LIPASE, BLOOD: Lipase: 25 U/L (ref 11–51)

## 2019-12-03 MED ORDER — ONDANSETRON 4 MG PO TBDP: ORAL_TABLET | ORAL | 0 refills | Status: DC

## 2019-12-03 MED ORDER — PANTOPRAZOLE SODIUM 40 MG IV SOLR
40.0000 mg | Freq: Once | INTRAVENOUS | Status: AC
  Administered 2019-12-03: 40 mg via INTRAVENOUS
  Filled 2019-12-03: qty 40

## 2019-12-03 MED ORDER — ONDANSETRON 4 MG PO TBDP
4.0000 mg | ORAL_TABLET | Freq: Once | ORAL | Status: AC
  Administered 2019-12-03: 4 mg via ORAL
  Filled 2019-12-03: qty 1

## 2019-12-03 MED ORDER — SODIUM CHLORIDE 0.9 % IV BOLUS
1000.0000 mL | Freq: Once | INTRAVENOUS | Status: AC
  Administered 2019-12-03: 1000 mL via INTRAVENOUS

## 2019-12-03 MED ORDER — SODIUM CHLORIDE 0.9% FLUSH: 3.0000 mL | Freq: Once | INTRAVENOUS | Status: DC

## 2019-12-03 MED ORDER — KETOROLAC TROMETHAMINE 30 MG/ML IJ SOLN
30.0000 mg | Freq: Once | INTRAMUSCULAR | Status: AC
  Administered 2019-12-03: 30 mg via INTRAVENOUS
  Filled 2019-12-03: qty 1

## 2019-12-03 MED ORDER — DICYCLOMINE HCL 20 MG PO TABS: ORAL_TABLET | ORAL | 0 refills | Status: DC

## 2019-12-03 NOTE — ED Provider Notes (Signed)
[name] Provider Note   CSN: [id number] Arrival date & time: 12/03/19  1800     History Chief Complaint  Patient presents with  . Abdominal Pain    [name] is a 64 y.o. female.  Patient with nausea vomiting diarrhea for a few days. He also states that her significant other had the same symptoms  The history is provided by the patient and medical records.  Abdominal Pain Pain location:  Generalized Pain quality: aching   Pain radiates to:  Does not radiate Pain severity:  Mild Onset quality:  Sudden Timing:  Intermittent Progression:  Waxing and waning Chronicity:  New Context: not alcohol use   Relieved by:  Nothing Associated symptoms: diarrhea and vomiting   Associated symptoms: no chest pain, no cough, no fatigue and no hematuria        Past Medical History:  Diagnosis Date  . Anxiety   . Asthma   . Cancer [facility])    Left breast  . Complication of anesthesia    pt states she is hard to wake up after anesthetic-"she was told this at [facility]."   . Constipation   . Constipation   . DDD (degenerative disc disease), lumbar   . Depression    situational  . Diabetes mellitus    Type II  . Dyspnea   . Family history of adverse reaction to anesthesia    Brother- N/V  . Fibromyalgia   . Gait instability   . GERD (gastroesophageal reflux disease)   . Headache   . History of blood transfusion    with childbirth  . History of radiation therapy 12/01/16-01/08/17   left breast 50.4 Gy in 28 fractions  . Hypertension   . Pneumonia 2008  . PONV (postoperative nausea and vomiting)   . Skin cancer   . Stroke [facility])    mini stroke 2008    Patient Active Problem List   Diagnosis Date Noted  . Fall 09/12/2019  . Gait abnormality 09/12/2019  . DM type 2 with diabetic peripheral neuropathy ([place]) 09/12/2019  . Low back pain with sciatica 09/12/2019  . Malignant neoplasm of upper-outer quadrant of left breast in female,  estrogen receptor positive ([place]) 09/30/2016  . Hx of colonic polyps 03/27/2016  . Dysphagia 09/03/2011  . Chest pain 01/02/2011  . Edema 01/02/2011  . WOUND INFECTION 10/20/2007  . [place] DISEASE 10/20/2007  . LAMINECTOMY, LUMBAR, HX OF 10/20/2007    Past Surgical History:  Procedure Laterality Date  . ABDOMINAL HYSTERECTOMY     Cervical- cancer cell  . ANTERIOR CERVICAL DISCECTOMY  2007  . BACK SURGERY  2009   Laminectomy  . Back surgery 4 yrs ago  2008  . BALLOON DILATION  10/02/2011   Procedure: BALLOON DILATION;  Surgeon: [name], MD;  [facility];  Service: Endoscopy;  Laterality: N/A;  . BREAST LUMPECTOMY WITH RADIOACTIVE SEED AND SENTINEL LYMPH NODE BIOPSY Left 10/13/2016   Procedure: BREAST LUMPECTOMY WITH RADIOACTIVE SEED AND SENTINEL LYMPH NODE BIOPSY;  Surgeon: [name], MD;  Location: [place];  Service: General;  Laterality: Left;  . CHOLECYSTECTOMY    . COLONOSCOPY WITH PROPOFOL N/A 04/18/2016   Procedure: COLONOSCOPY WITH PROPOFOL;  Surgeon: [name], MD;  Location: [facility];  Service: Endoscopy;  Laterality: N/A;  955  . FOOT SURGERY     tried to remove bone spur- was unable- "just clean"  . MALONEY DILATION  10/02/2011  Procedure: MALONEY DILATION;  Surgeon: [name], MD;  [facility]: [facility];  Service: Endoscopy;  Laterality: N/A;  . SAVORY DILATION  10/02/2011   Procedure: SAVORY DILATION;  Surgeon: [name], MD;  [facility];  Service: Endoscopy;  Laterality: N/A;     OB History   No obstetric history on file.     Family History  Problem Relation Age of Onset  . Heart attack Mother   . Lung cancer Father     Social History   Tobacco Use  . Smoking status: Never Smoker  . Smokeless tobacco: Never Used  Vaping Use  . Vaping Use: Never used  Substance Use Topics  . Alcohol use: Yes    Alcohol/week: 0.0 standard drinks    Comment: glass of wine- occasional  . Drug use:  Never    Home Medications Prior to Admission medications   Medication Sig Start Date End Date Taking? Authorizing Provider  acarbose (PRECOSE) 50 MG tablet Take 50 mg by mouth daily.    [provider]  ALPRAZolam Duanne Moron) 1 MG tablet Take 1 mg by mouth 4 (four) times daily as needed. Reports taking 0.5 tab QHS. 12/25/14   [provider]  atenolol (TENORMIN) 25 MG tablet Take 25 mg by mouth daily.  12/17/10   [provider]  atorvastatin (LIPITOR) 20 MG tablet Take 20 mg by mouth daily.    [provider]  dicyclomine (BENTYL) 20 MG tablet Take 1 every 8-12 hours as needed for abdominal cramping 12/03/19   Milton Ferguson, MD  Dulaglutide (TRULICITY) 1.5 KA/7.6OT SOPN Inject 1.5 mg into the skin once a week.    [provider]  furosemide (LASIX) 20 MG tablet Take 20 mg by mouth as needed for edema.    [provider]  gabapentin (NEURONTIN) 300 MG capsule Take 300 mg by mouth 3 (three) times daily as needed.    [provider]  glimepiride (AMARYL) 4 MG tablet Take 4 mg by mouth 2 (two) times daily.    [provider]  lidocaine (LIDODERM) 5 % Place 1 patch onto the skin daily as needed (pain). Remove & Discard patch within 12 hours or as directed by MD     [provider]  losartan (COZAAR) 25 MG tablet Take 25 mg by mouth daily. 03/11/16   [provider]  metFORMIN (GLUCOPHAGE) 1000 MG tablet Take 1,000 mg by mouth daily.  03/19/16   [provider]  ondansetron (ZOFRAN ODT) 4 MG disintegrating tablet 4mg  ODT q4 hours prn nausea/vomit 12/03/19   Milton Ferguson, MD  Oxycodone HCl 10 MG TABS Take 10 mg by mouth every 6 (six) hours as needed (pain).     [provider]  pantoprazole (PROTONIX) 40 MG tablet Take 40 mg by mouth daily.    [provider]  tamoxifen (NOLVADEX) 20 MG tablet Take 20 mg by mouth daily.    [provider]  venlafaxine XR (EFFEXOR-XR) 37.5 MG 24 hr  capsule TAKE ONE CAPSULE (37.5MG  TOTAL) BY MOUTHDAILY WITH BREAKFAS 10/24/19   Magrinat, Virgie Dad, MD    Allergies    Adhesive [tape], Amoxicillin-pot clavulanate, Codeine, Penicillins, and Sulfonamide derivatives  Review of Systems   Review of Systems  Constitutional: Negative for appetite change and fatigue.  HENT: Negative for congestion, ear discharge and sinus pressure.   Eyes: Negative for discharge.  Respiratory: Negative for cough.   Cardiovascular: Negative for chest pain.  Gastrointestinal: Positive for abdominal  pain, diarrhea and vomiting.  Genitourinary: Negative for frequency and hematuria.  Musculoskeletal: Negative for back pain.  Skin: Negative for rash.  Neurological: Negative for seizures and headaches.  Psychiatric/Behavioral: Negative for hallucinations.    Physical Exam Updated Vital Signs BP (!) 135/106 (BP Location: Right Arm)   Pulse (!) 108   Temp (!) 97.4 F (36.3 C) (Temporal)   Resp 20   Ht 5\' 6"  (1.676 m)   Wt 96.5 kg   SpO2 94%   BMI 34.35 kg/m   Physical Exam Vitals and nursing note reviewed.  Constitutional:      Appearance: She is well-developed.  HENT:     Head: Normocephalic.     Nose: Nose normal.  Eyes:     General: No scleral icterus.    Conjunctiva/sclera: Conjunctivae normal.  Neck:     Thyroid: No thyromegaly.  Cardiovascular:     Rate and Rhythm: Normal rate and regular rhythm.     Heart sounds: No murmur heard.  No friction rub. No gallop.   Pulmonary:     Breath sounds: No stridor. No wheezing or rales.  Chest:     Chest wall: No tenderness.  Abdominal:     General: There is no distension.     Tenderness: There is abdominal tenderness. There is no rebound.  Musculoskeletal:        General: Normal range of motion.     Cervical back: Neck supple.  Lymphadenopathy:     Cervical: No cervical adenopathy.  Skin:    Findings: No erythema or rash.  Neurological:     Mental Status: She is alert and oriented to person,  place, and time.     Motor: No abnormal muscle tone.     Coordination: Coordination normal.  Psychiatric:        Behavior: Behavior normal.     ED Results / Procedures / Treatments   Labs (all labs ordered are listed, but only abnormal results are displayed) Labs Reviewed  COMPREHENSIVE METABOLIC PANEL - Abnormal; Notable for the following components:      Result Value   Glucose, Bld 219 (*)    Creatinine, Ser 1.06 (*)    Calcium 8.7 (*)    AST 45 (*)    ALT 47 (*)    GFR calc non Af Amer 56 (*)    All other components within normal limits  CBC - Abnormal; Notable for the following components:   Platelets 136 (*)    All other components within normal limits  LIPASE, BLOOD    EKG None  Radiology No results found.  Procedures Procedures (including critical care time)  Medications Ordered in ED Medications  sodium chloride flush (NS) 0.9 % injection 3 mL (3 mLs Intravenous Not Given 12/03/19 1855)  sodium chloride 0.9 % bolus 1,000 mL (0 mLs Intravenous Stopped 12/03/19 2125)  ondansetron (ZOFRAN-ODT) disintegrating tablet 4 mg (4 mg Oral Given 12/03/19 1917)  ketorolac (TORADOL) 30 MG/ML injection 30 mg (30 mg Intravenous Given 12/03/19 1918)  pantoprazole (PROTONIX) injection 40 mg (40 mg Intravenous Given 12/03/19 1918)    ED Course  I have reviewed the triage vital signs and the nursing notes.  Pertinent labs & imaging results that were available during my care of the patient were reviewed by me and considered in my medical decision making (see chart for details).    MDM Rules/Calculators/A&P  Labs unremarkable. Patient improved with fluids nausea medicine and Toradol. She will be discharged home with a diagnosis of gastroenteritis and follow-up with her PCP       This patient presents to the ED for concern of vomiting and diarrhea this involves an extensive number of treatment options, and is a complaint that carries with it a high  risk of complications and morbidity.  The differential diagnosis includes gastroenteritis small bowel obstruction   Lab Tests:   I Ordered, reviewed, and interpreted labs, which included CBC and chemistries show mild elevated glucose  Medicines ordered:   I ordered medication normal saline for dehydration  Imaging Studies ordered:   Additional history obtained:   Additional history obtained from husband  Previous records obtained and reviewed   Consultations Obtained:    Reevaluation:  After the interventions stated above, I reevaluated the patient and found improved  Critical Interventions:  .   Final Clinical Impression(s) / ED Diagnoses Final diagnoses:  Nausea vomiting and diarrhea    Rx / DC Orders ED Discharge Orders         Ordered    ondansetron (ZOFRAN ODT) 4 MG disintegrating tablet     Discontinue  Reprint     12/03/19 2128    dicyclomine (BENTYL) 20 MG tablet     Discontinue  Reprint     12/03/19 2128           Milton Ferguson, MD 12/05/19 1106

## 2019-12-03 NOTE — Discharge Instructions (Addendum)
Drink plenty of fluids.  Increase your Protonix so you are taking it twice a day for the next week.  Follow-up with your family doctor this week if any problems

## 2019-12-03 NOTE — ED Triage Notes (Signed)
Pt was out of trulicity   Then had it refilled - got sick afterward  N/V/D   Today with dry heaves, Vomited x 1   D   Vomit is "pure Green"

## 2019-12-08 DIAGNOSIS — M1991 Primary osteoarthritis, unspecified site: Secondary | ICD-10-CM | POA: Diagnosis not present

## 2019-12-08 DIAGNOSIS — G629 Polyneuropathy, unspecified: Secondary | ICD-10-CM | POA: Diagnosis not present

## 2019-12-08 DIAGNOSIS — M5416 Radiculopathy, lumbar region: Secondary | ICD-10-CM | POA: Diagnosis not present

## 2019-12-08 DIAGNOSIS — G894 Chronic pain syndrome: Secondary | ICD-10-CM | POA: Diagnosis not present

## 2019-12-14 DIAGNOSIS — N1831 Chronic kidney disease, stage 3a: Secondary | ICD-10-CM | POA: Diagnosis not present

## 2019-12-14 DIAGNOSIS — M1991 Primary osteoarthritis, unspecified site: Secondary | ICD-10-CM | POA: Diagnosis not present

## 2019-12-14 DIAGNOSIS — E1122 Type 2 diabetes mellitus with diabetic chronic kidney disease: Secondary | ICD-10-CM | POA: Diagnosis not present

## 2019-12-14 DIAGNOSIS — I129 Hypertensive chronic kidney disease with stage 1 through stage 4 chronic kidney disease, or unspecified chronic kidney disease: Secondary | ICD-10-CM | POA: Diagnosis not present

## 2019-12-20 DIAGNOSIS — E1142 Type 2 diabetes mellitus with diabetic polyneuropathy: Secondary | ICD-10-CM | POA: Diagnosis not present

## 2019-12-20 DIAGNOSIS — L84 Corns and callosities: Secondary | ICD-10-CM | POA: Diagnosis not present

## 2019-12-21 ENCOUNTER — Other Ambulatory Visit: Payer: Self-pay | Admitting: *Deleted

## 2019-12-21 MED ORDER — TAMOXIFEN CITRATE 20 MG PO TABS: 20.0000 mg | ORAL_TABLET | Freq: Every day | ORAL | 0 refills | Status: DC

## 2019-12-21 MED ORDER — VENLAFAXINE HCL ER 37.5 MG PO CP24: ORAL_CAPSULE | ORAL | 0 refills | Status: DC

## 2020-01-05 DIAGNOSIS — M5416 Radiculopathy, lumbar region: Secondary | ICD-10-CM | POA: Diagnosis not present

## 2020-01-05 DIAGNOSIS — G629 Polyneuropathy, unspecified: Secondary | ICD-10-CM | POA: Diagnosis not present

## 2020-01-05 DIAGNOSIS — G894 Chronic pain syndrome: Secondary | ICD-10-CM | POA: Diagnosis not present

## 2020-01-13 DIAGNOSIS — M1991 Primary osteoarthritis, unspecified site: Secondary | ICD-10-CM | POA: Diagnosis not present

## 2020-01-13 DIAGNOSIS — E1122 Type 2 diabetes mellitus with diabetic chronic kidney disease: Secondary | ICD-10-CM | POA: Diagnosis not present

## 2020-01-13 DIAGNOSIS — N1831 Chronic kidney disease, stage 3a: Secondary | ICD-10-CM | POA: Diagnosis not present

## 2020-01-13 DIAGNOSIS — I129 Hypertensive chronic kidney disease with stage 1 through stage 4 chronic kidney disease, or unspecified chronic kidney disease: Secondary | ICD-10-CM | POA: Diagnosis not present

## 2020-02-02 ENCOUNTER — Other Ambulatory Visit (HOSPITAL_COMMUNITY): Payer: Self-pay | Admitting: Physician Assistant

## 2020-02-02 DIAGNOSIS — C50919 Malignant neoplasm of unspecified site of unspecified female breast: Secondary | ICD-10-CM

## 2020-02-06 DIAGNOSIS — M5416 Radiculopathy, lumbar region: Secondary | ICD-10-CM | POA: Diagnosis not present

## 2020-02-06 DIAGNOSIS — G894 Chronic pain syndrome: Secondary | ICD-10-CM | POA: Diagnosis not present

## 2020-02-06 DIAGNOSIS — M5136 Other intervertebral disc degeneration, lumbar region: Secondary | ICD-10-CM | POA: Diagnosis not present

## 2020-02-06 DIAGNOSIS — E119 Type 2 diabetes mellitus without complications: Secondary | ICD-10-CM | POA: Diagnosis not present

## 2020-02-07 ENCOUNTER — Other Ambulatory Visit: Payer: Self-pay | Admitting: *Deleted

## 2020-02-07 DIAGNOSIS — C50412 Malignant neoplasm of upper-outer quadrant of left female breast: Secondary | ICD-10-CM

## 2020-02-08 ENCOUNTER — Ambulatory Visit (HOSPITAL_COMMUNITY): Payer: Medicare Other

## 2020-02-08 ENCOUNTER — Ambulatory Visit (HOSPITAL_COMMUNITY)
Admission: RE | Admit: 2020-02-08 | Discharge: 2020-02-08 | Disposition: A | Discharge status: Home / Self Care | Payer: Medicare Other | Source: Ambulatory Visit | Attending: Physician Assistant | Admitting: Physician Assistant

## 2020-02-08 ENCOUNTER — Other Ambulatory Visit: Payer: Self-pay

## 2020-02-08 DIAGNOSIS — Z853 Personal history of malignant neoplasm of breast: Secondary | ICD-10-CM | POA: Insufficient documentation

## 2020-02-08 DIAGNOSIS — C50919 Malignant neoplasm of unspecified site of unspecified female breast: Secondary | ICD-10-CM

## 2020-02-08 DIAGNOSIS — R922 Inconclusive mammogram: Secondary | ICD-10-CM | POA: Diagnosis not present

## 2020-02-08 NOTE — Progress Notes (Signed)
[facility]  [phone] Fax:([phone]) [phone]     ID: [name] DOB: July 05, 1955  MR#: [id number]  WVP#:[id number]  Patient Care Team: Elfredia Nevins, MD as PCP - General (Internal Medicine) Emelia Loron, MD as Consulting Physician (General Surgery) Kemuel Buchmann, Valentino Hue, MD as Consulting Physician (Oncology) Antony Blackbird, MD as Consulting Physician (Radiation Oncology) Malissa Hippo, MD as Consulting Physician (Gastroenterology) Axel Filler, Larna Daughters, NP as Nurse Practitioner (Hematology and Oncology) OTHER MD:  CHIEF COMPLAINT: Estrogen receptor positive breast cancer  CURRENT TREATMENT: Tamoxifen    INTERVAL HISTORY: [name] returns today for follow-up of her estrogen receptor positive breast cancer.   She continues on tamoxifen.  She tolerates this well with no unusual side effects.  Since her last visit, she underwent bilateral diagnostic mammography with tomography at [facility] yesterday, 02/08/2020, showing: breast density category C; no evidence of malignancy in either breast, stable lumpectomy site.  She also underwent lumbar spine MRI on 10/05/2019 showing: stable changes of posterior fusion from L2-L5 with prominent disc and facet degenerative changes at L1-L2 resulting in mild posterior canal and bilateral foraminal narrowing and at L5-S1 where there is bilateral foraminal narrowing due to facet hypertrophy.   REVIEW OF SYSTEMS: [name] is working hard to lose some weight and is on a diet now which is working for her. Her goal is to get down to 200 pounds. She walks her dog twice a day for exercise. [facility].  She received the Moderna vaccine without any complications.  [place] will plan to continue her mammography and breast cancer follow-up here.  A detailed review of systems today was otherwise stable   BREAST CANCER HISTORY: From the original intake  note:  [name] had bilateral screening mammography with tomography at [facility] 08/28/2016. There was a possible mass in the left breast and on 09/16/2016 she was brought back for left diagnostic mammography with tomography and left breast ultrasonography. The left breast density was category B. In the left breast there was an irregular mass with architectural distortion in the upper outer quadrant, which appear to measure approximately 0.8 cm. Thank you by ultrasound this measured 0.9 cm and it was located at the 2:00 axis 7 cm from the nipple. Ultrasound of the axilla was reported as negative.   Biopsy of the left breast mass in question 09/23/2016 showed [facility] [phone]) ductal carcinoma in situ, E-cadherin positive, estrogen receptor 100% positive with strong staining intensity, progesterone receptor 90% positive with moderate staining intensity, with an MIB-1 of 10%, and no HER-2 amplification, signals ratio being 1.05 and the number per cell 1.10.  Her subsequent history is as detailed below   PAST MEDICAL HISTORY: Past Medical History:  Diagnosis Date   Anxiety    Asthma    Cancer (HCC)    Left breast   Complication of anesthesia    pt states she is hard to wake up after anesthetic-"she was told this at [facility]    Constipation    Constipation    DDD (degenerative disc disease), lumbar    Depression    situational   Diabetes mellitus    Type II   Dyspnea    Family history of adverse reaction to anesthesia    Brother- N/V   Fibromyalgia    Gait instability    GERD (gastroesophageal reflux disease)    Headache    History of blood transfusion  with childbirth   History of radiation therapy 12/01/16-01/08/17   left breast 50.4 Gy in 28 fractions   Hypertension    Pneumonia 2008   PONV (postoperative nausea and vomiting)    Skin cancer    Stroke [facility])    mini stroke 2008    PAST SURGICAL HISTORY: Past Surgical History:    Procedure Laterality Date   ABDOMINAL HYSTERECTOMY     Cervical- cancer cell   ANTERIOR CERVICAL DISCECTOMY  2007   BACK SURGERY  2009   Laminectomy   Back surgery 4 yrs ago  2008   BALLOON DILATION  10/02/2011   Procedure: BALLOON DILATION;  Surgeon: Malissa Hippo, MD;  Location: [facility];  Service: Endoscopy;  Laterality: N/A;   BREAST LUMPECTOMY WITH RADIOACTIVE SEED AND SENTINEL LYMPH NODE BIOPSY Left 10/13/2016   Procedure: BREAST LUMPECTOMY WITH RADIOACTIVE SEED AND SENTINEL LYMPH NODE BIOPSY;  Surgeon: Emelia Loron, MD;  Location: [facility] OR;  Service: General;  Laterality: Left;   CHOLECYSTECTOMY     COLONOSCOPY WITH PROPOFOL N/A 04/18/2016   Procedure: COLONOSCOPY WITH PROPOFOL;  Surgeon: Malissa Hippo, MD;  Location: [facility];  Service: Endoscopy;  Laterality: N/A;  955   FOOT SURGERY     tried to remove bone spur- was unable- "just clean"   MALONEY DILATION  10/02/2011   Procedure: MALONEY DILATION;  Surgeon: Malissa Hippo, MD;  Location: [facility];  Service: Endoscopy;  Laterality: N/A;   SAVORY DILATION  10/02/2011   Procedure: SAVORY DILATION;  Surgeon: Malissa Hippo, MD;  Location: [facility];  Service: Endoscopy;  Laterality: N/A;    FAMILY HISTORY Family History  Problem Relation Age of Onset   Heart attack Mother    Lung cancer Father   The patient's father died at the age of 33, 4 years after being diagnosed with lung cancer in the setting of tobacco abuse. The patient's mother died at the age of 44 from a heart attack. The patient had 6 brothers, 3 sisters. There is no history of breast or ovarian cancer in the family to her knowledge.   GYNECOLOGIC HISTORY:  No LMP recorded. Patient has had a hysterectomy. Menarche age 33, first live birth age 78, the patient is GX P2. She underwent hysterectomy with unilateral salpingo-oophorectomy at age 24. She used estradiol as hormone replacement until her diagnosis of breast cancer in April  2018.   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 She also has significant fibromyalgia. She is separated but not divorced from her husband [name].  (As of 05/05/2017 they were back living together).  He has significant medical problems. The patient's son [name] lives in [place]; he works at [employer]. The patient tells me [name] was married in [place] because it was his husband's dream. She greatly enjoyed the wedding. Son [name] lives in [place] and works in a Airline pilot. The patient has no grandchildren. She is a Control and instrumentation engineer.    ADVANCED DIRECTIVES: Not in place; patient is comfortable having her husband as healthcare power of attorney   HEALTH MAINTENANCE: Social History   Tobacco Use   Smoking status: Never Smoker   Smokeless tobacco: Never Used  Vaping Use   Vaping Use: Never used  Substance Use Topics   Alcohol use: Yes    Alcohol/week: 0.0 standard drinks    Comment: glass of wine- occasional   Drug  use: Never     Colonoscopy: November 2017/ Dr. Karilyn Cota  PAP:  Bone density:   Allergies  Allergen Reactions   Adhesive [Tape] Other (See Comments)    Tears skin   Amoxicillin-Pot Clavulanate Nausea And Vomiting   Codeine Itching   Penicillins Other (See Comments)    Upset stomach   Sulfonamide Derivatives Other (See Comments)    Burning sensation    Current Outpatient Medications  Medication Sig Dispense Refill   acarbose (PRECOSE) 50 MG tablet Take 50 mg by mouth daily.     ALPRAZolam (XANAX) 1 MG tablet Take 1 mg by mouth 4 (four) times daily as needed. Reports taking 0.5 tab QHS.     atenolol (TENORMIN) 25 MG tablet Take 25 mg by mouth daily.      atorvastatin (LIPITOR) 20 MG tablet Take 20 mg by mouth daily.     dicyclomine (BENTYL) 20 MG tablet Take 1 every 8-12 hours as needed for abdominal cramping 15 tablet 0   Dulaglutide (TRULICITY) 1.5 MG/0.5ML SOPN  Inject 1.5 mg into the skin once a week.     furosemide (LASIX) 20 MG tablet Take 20 mg by mouth as needed for edema.     gabapentin (NEURONTIN) 300 MG capsule Take 300 mg by mouth 3 (three) times daily as needed.     glimepiride (AMARYL) 4 MG tablet Take 4 mg by mouth 2 (two) times daily.     lidocaine (LIDODERM) 5 % Place 1 patch onto the skin daily as needed (pain). Remove & Discard patch within 12 hours or as directed by MD      losartan (COZAAR) 25 MG tablet Take 25 mg by mouth daily.     metFORMIN (GLUCOPHAGE) 1000 MG tablet Take 1,000 mg by mouth daily.      ondansetron (ZOFRAN ODT) 4 MG disintegrating tablet 4mg  ODT q4 hours prn nausea/vomit 12 tablet 0   Oxycodone HCl 10 MG TABS Take 10 mg by mouth every 6 (six) hours as needed (pain).      pantoprazole (PROTONIX) 40 MG tablet Take 40 mg by mouth daily.     tamoxifen (NOLVADEX) 20 MG tablet Take 1 tablet (20 mg total) by mouth daily. 90 tablet 0   venlafaxine XR (EFFEXOR-XR) 37.5 MG 24 hr capsule TAKE ONE CAPSULE (37.5MG  TOTAL) BY MOUTHDAILY WITH BREAKFAS 90 capsule 0   No current facility-administered medications for this visit.    OBJECTIVE: White woman in no acute distress  Vitals:   02/09/20 0855  BP: 131/71  Pulse: 86  Resp: 18  Temp: (!) 97.1 F (36.2 C)  SpO2: 99%    Wt Readings from Last 3 Encounters:  02/09/20 212 lb (96.2 kg)  12/03/19 212 lb 12.8 oz (96.5 kg)  09/12/19 222 lb (100.7 kg)   Body mass index is 34.22 kg/m.      ECOG FS:1 - Symptomatic but completely ambulatory  Sclerae unicteric, EOMs intact Wearing a mask No cervical or supraclavicular adenopathy Lungs no rales or rhonchi Heart regular rate and rhythm Abd soft, nontender, positive bowel sounds MSK no focal spinal tenderness, no upper extremity lymphedema Neuro: nonfocal, well oriented, appropriate affect Breasts: The right breast is benign.  The left breast has undergone lumpectomy followed by radiation.  There is no evidence  of local recurrence.  Both axillae are benign.   LAB RESULTS:  CMP     Component Value Date/Time   NA 138 02/09/2020 0843   NA 140 05/05/2017 1431   Laurie 4.5 02/09/2020  0843   Laurie 4.4 05/05/2017 1431   CL 103 02/09/2020 0843   CO2 28 02/09/2020 0843   CO2 26 05/05/2017 1431   GLUCOSE 229 (H) 02/09/2020 0843   GLUCOSE 194 (H) 05/05/2017 1431   BUN 12 02/09/2020 0843   BUN 15.0 05/05/2017 1431   CREATININE 1.21 (H) 02/09/2020 0843   CREATININE 1.2 (H) 05/05/2017 1431   CALCIUM 10.0 02/09/2020 0843   CALCIUM 9.7 05/05/2017 1431   PROT 6.5 02/09/2020 0843   PROT 7.3 05/05/2017 1431   ALBUMIN 3.5 02/09/2020 0843   ALBUMIN 3.8 05/05/2017 1431   AST 29 02/09/2020 0843   AST 39 (H) 05/05/2017 1431   ALT 28 02/09/2020 0843   ALT 51 05/05/2017 1431   ALKPHOS 69 02/09/2020 0843   ALKPHOS 74 05/05/2017 1431   BILITOT 0.8 02/09/2020 0843   BILITOT 0.77 05/05/2017 1431   GFRNONAA 48 (L) 02/09/2020 0843   GFRAA 56 (L) 02/09/2020 0843    No results found for: Dorene Ar, A1GS, A2GS, BETS, BETA2SER, GAMS, MSPIKE, SPEI  No results found for: Ron Parker, [facility]  Lab Results  Component Value Date   WBC 6.3 02/09/2020   NEUTROABS 2.7 02/09/2020   HGB 13.8 02/09/2020   HCT 41.0 02/09/2020   MCV 93.8 02/09/2020   PLT 158 02/09/2020      Chemistry      Component Value Date/Time   NA 138 02/09/2020 0843   NA 140 05/05/2017 1431   Laurie 4.5 02/09/2020 0843   Laurie 4.4 05/05/2017 1431   CL 103 02/09/2020 0843   CO2 28 02/09/2020 0843   CO2 26 05/05/2017 1431   BUN 12 02/09/2020 0843   BUN 15.0 05/05/2017 1431   CREATININE 1.21 (H) 02/09/2020 0843   CREATININE 1.2 (H) 05/05/2017 1431      Component Value Date/Time   CALCIUM 10.0 02/09/2020 0843   CALCIUM 9.7 05/05/2017 1431   ALKPHOS 69 02/09/2020 0843   ALKPHOS 74 05/05/2017 1431   AST 29 02/09/2020 0843   AST 39 (H) 05/05/2017 1431   ALT 28 02/09/2020 0843   ALT 51 05/05/2017 1431   BILITOT 0.8  02/09/2020 0843   BILITOT 0.77 05/05/2017 1431       No results found for: LABCA2  No components found for: [id number]  No results for input(s): INR in the last 168 hours.  Urinalysis    Component Value Date/Time   COLORURINE YELLOW 07/30/2014 1600   APPEARANCEUR CLEAR 07/30/2014 1600   LABSPEC 1.020 07/30/2014 1600   PHURINE 5.5 07/30/2014 1600   GLUCOSEU >1000 (A) 07/30/2014 1600   HGBUR NEGATIVE 07/30/2014 1600   BILIRUBINUR NEGATIVE 07/30/2014 1600   KETONESUR NEGATIVE 07/30/2014 1600   PROTEINUR NEGATIVE 07/30/2014 1600   UROBILINOGEN 0.2 07/30/2014 1600   NITRITE NEGATIVE 07/30/2014 1600   LEUKOCYTESUR NEGATIVE 07/30/2014 1600     STUDIES: MM DIAG BREAST TOMO BILATERAL  Result Date: 02/08/2020 CLINICAL DATA:  64 year old female with history of left breast cancer post lumpectomy 10/13/2016. EXAM: DIGITAL DIAGNOSTIC BILATERAL MAMMOGRAM WITH TOMO AND CAD COMPARISON:  Previous exams. ACR Breast Density Category c: The breast tissue is heterogeneously dense, which may obscure small masses. FINDINGS: No suspicious masses or calcifications are seen in either breast. Lumpectomy changes are again identified in the upper-outer posterior left breast. Spot compression magnification MLO view of the left breast lumpectomy site was performed. There is no mammographic evidence of locally recurrent malignancy. Mammographic images were processed with CAD. IMPRESSION: Stable left breast lumpectomy site. No  mammographic evidence of malignancy in either breast. RECOMMENDATION: Diagnostic mammogram is suggested in 1 year. (Code:[id number]-B-01Y) I have discussed the findings and recommendations with the patient. If applicable, a reminder letter will be sent to the patient regarding the next appointment. BI-RADS CATEGORY  2: Benign. Electronically Signed   By: Edwin Cap M.D.   On: 02/08/2020 11:09     ELIGIBLE FOR AVAILABLE RESEARCH PROTOCOL: no  ASSESSMENT: 64 y.o. [place] woman status  post left breast upper outer quadrant biopsy 09/23/2016 for a clinical T1b N0, [place] IA t invasive ductal carcinoma, grade 1, estrogen and progesterone receptor positive, HER-2 not amplified, with an MIB-1 of 10%  (1) status post left lumpectomy and sentinel lymph node sampling 10/13/2016 for a pT1c pN0, stage IA invasive ductal carcinoma, grade 1, with negative margins.  (2) Oncotype DX score of 25 predicts a 10 year risk of recurrence outside the breast of 17% if the patient's only systemic therapy is tamoxifen for 5 years  (a) the possible benefit from chemotherapy in the 5% range was discussed. The patient declined adjuvant chemotherapy  (3) adjuvant radiation6/18/18 - 01/08/17 Left Breast// 50.4 Gy in 28 fractions   (4) started anastrozole July 2018; discontinued September 2018 with side effects  (a) bone density 12/01/2016 shows a T score of 1.3 (normal).  (5) started tamoxifen 05/16/2017   PLAN: [name] is now a little over 3 years out from definitive surgery for her breast cancer with no evidence of disease recurrence.  This is very favorable.  She is tolerating tamoxifen well and the plan is to continue that a total of 5 years.  She is thinking of moving to [place] area but would like to continue medical care and mammography here.  We can certainly accommodate that in any way that is easiest for her.  I commended her exercise and diet program and encouraged her to intensify it  She will return to see me September of next year.  She knows to call for any other issue that may develop before then.  Total encounter time 25 minutes.  [name], Valentino Hue, MD  02/09/20 9:34 AM Medical Oncology and Hematology [facility] [address] [place], [place] [address] Tel. [phone]    Fax. [phone]   I, Mickie Bail, am acting as scribe for Dr. Valentino Hue. Page Pucciarelli.  I, Ruthann Cancer MD, have reviewed the above documentation for accuracy and  completeness, and I agree with the above.    *Total Encounter Time as defined by the Centers for Medicare and Medicaid Services includes, in addition to the face-to-face time of a patient visit (documented in the note above) non-face-to-face time: obtaining and reviewing outside history, ordering and reviewing medications, tests or procedures, care coordination (communications with other health care professionals or caregivers) and documentation in the medical record.

## 2020-02-09 ENCOUNTER — Inpatient Hospital Stay: Payer: Medicare Other

## 2020-02-09 ENCOUNTER — Other Ambulatory Visit: Payer: Self-pay

## 2020-02-09 ENCOUNTER — Inpatient Hospital Stay: Payer: Medicare Other | Attending: Oncology | Admitting: Oncology

## 2020-02-09 VITALS — BP 131/71 | HR 86 | Temp 97.1°F | Resp 18 | Ht 66.0 in | Wt 212.0 lb

## 2020-02-09 DIAGNOSIS — Z7984 Long term (current) use of oral hypoglycemic drugs: Secondary | ICD-10-CM | POA: Insufficient documentation

## 2020-02-09 DIAGNOSIS — C50412 Malignant neoplasm of upper-outer quadrant of left female breast: Secondary | ICD-10-CM | POA: Insufficient documentation

## 2020-02-09 DIAGNOSIS — E119 Type 2 diabetes mellitus without complications: Secondary | ICD-10-CM | POA: Insufficient documentation

## 2020-02-09 DIAGNOSIS — Z170 Estrogen receptor positive status [ER+]: Secondary | ICD-10-CM | POA: Insufficient documentation

## 2020-02-09 DIAGNOSIS — Z923 Personal history of irradiation: Secondary | ICD-10-CM | POA: Insufficient documentation

## 2020-02-09 DIAGNOSIS — I10 Essential (primary) hypertension: Secondary | ICD-10-CM | POA: Insufficient documentation

## 2020-02-09 DIAGNOSIS — Z79810 Long term (current) use of selective estrogen receptor modulators (SERMs): Secondary | ICD-10-CM | POA: Diagnosis not present

## 2020-02-09 DIAGNOSIS — Z79899 Other long term (current) drug therapy: Secondary | ICD-10-CM | POA: Insufficient documentation

## 2020-02-09 LAB — CBC WITH DIFFERENTIAL (CANCER CENTER ONLY)
Abs Immature Granulocytes: 0.02 10*3/uL (ref 0.00–0.07)
Basophils Absolute: 0 10*3/uL (ref 0.0–0.1)
Basophils Relative: 1 %
Eosinophils Absolute: 0.1 10*3/uL (ref 0.0–0.5)
Eosinophils Relative: 2 %
HCT: 41 % (ref 36.0–46.0)
Hemoglobin: 13.8 g/dL (ref 12.0–15.0)
Immature Granulocytes: 0 %
Lymphocytes Relative: 44 %
Lymphs Abs: 2.8 10*3/uL (ref 0.7–4.0)
MCH: 31.6 pg (ref 26.0–34.0)
MCHC: 33.7 g/dL (ref 30.0–36.0)
MCV: 93.8 fL (ref 80.0–100.0)
Monocytes Absolute: 0.6 10*3/uL (ref 0.1–1.0)
Monocytes Relative: 10 %
Neutro Abs: 2.7 10*3/uL (ref 1.7–7.7)
Neutrophils Relative %: 43 %
Platelet Count: 158 10*3/uL (ref 150–400)
RBC: 4.37 MIL/uL (ref 3.87–5.11)
RDW: 12.5 % (ref 11.5–15.5)
WBC Count: 6.3 10*3/uL (ref 4.0–10.5)
nRBC: 0 % (ref 0.0–0.2)

## 2020-02-09 LAB — CMP (CANCER CENTER ONLY)
ALT: 28 U/L (ref 0–44)
AST: 29 U/L (ref 15–41)
Albumin: 3.5 g/dL (ref 3.5–5.0)
Alkaline Phosphatase: 69 U/L (ref 38–126)
Anion gap: 7 (ref 5–15)
BUN: 12 mg/dL (ref 8–23)
CO2: 28 mmol/L (ref 22–32)
Calcium: 10 mg/dL (ref 8.9–10.3)
Chloride: 103 mmol/L (ref 98–111)
Creatinine: 1.21 mg/dL — ABNORMAL HIGH (ref 0.44–1.00)
GFR, Est AFR Am: 56 mL/min — ABNORMAL LOW (ref >60)
GFR, Estimated: 48 mL/min — ABNORMAL LOW (ref >60)
Glucose, Bld: 229 mg/dL — ABNORMAL HIGH (ref 70–99)
Potassium: 4.5 mmol/L (ref 3.5–5.1)
Sodium: 138 mmol/L (ref 135–145)
Total Bilirubin: 0.8 mg/dL (ref 0.3–1.2)
Total Protein: 6.5 g/dL (ref 6.5–8.1)

## 2020-02-14 DIAGNOSIS — E1122 Type 2 diabetes mellitus with diabetic chronic kidney disease: Secondary | ICD-10-CM | POA: Diagnosis not present

## 2020-02-14 DIAGNOSIS — I129 Hypertensive chronic kidney disease with stage 1 through stage 4 chronic kidney disease, or unspecified chronic kidney disease: Secondary | ICD-10-CM | POA: Diagnosis not present

## 2020-02-14 DIAGNOSIS — M1991 Primary osteoarthritis, unspecified site: Secondary | ICD-10-CM | POA: Diagnosis not present

## 2020-02-14 DIAGNOSIS — N1831 Chronic kidney disease, stage 3a: Secondary | ICD-10-CM | POA: Diagnosis not present

## 2020-03-07 DIAGNOSIS — G894 Chronic pain syndrome: Secondary | ICD-10-CM | POA: Diagnosis not present

## 2020-03-07 DIAGNOSIS — M5136 Other intervertebral disc degeneration, lumbar region: Secondary | ICD-10-CM | POA: Diagnosis not present

## 2020-03-07 DIAGNOSIS — G629 Polyneuropathy, unspecified: Secondary | ICD-10-CM | POA: Diagnosis not present

## 2020-03-15 DIAGNOSIS — E1122 Type 2 diabetes mellitus with diabetic chronic kidney disease: Secondary | ICD-10-CM | POA: Diagnosis not present

## 2020-03-15 DIAGNOSIS — M1991 Primary osteoarthritis, unspecified site: Secondary | ICD-10-CM | POA: Diagnosis not present

## 2020-03-15 DIAGNOSIS — E7849 Other hyperlipidemia: Secondary | ICD-10-CM | POA: Diagnosis not present

## 2020-03-15 DIAGNOSIS — I129 Hypertensive chronic kidney disease with stage 1 through stage 4 chronic kidney disease, or unspecified chronic kidney disease: Secondary | ICD-10-CM | POA: Diagnosis not present

## 2020-03-19 ENCOUNTER — Ambulatory Visit: Payer: Medicare Other | Admitting: Neurology

## 2020-04-05 DIAGNOSIS — M5416 Radiculopathy, lumbar region: Secondary | ICD-10-CM | POA: Diagnosis not present

## 2020-04-05 DIAGNOSIS — Z23 Encounter for immunization: Secondary | ICD-10-CM | POA: Diagnosis not present

## 2020-04-05 DIAGNOSIS — G894 Chronic pain syndrome: Secondary | ICD-10-CM | POA: Diagnosis not present

## 2020-04-09 ENCOUNTER — Other Ambulatory Visit: Payer: Self-pay

## 2020-04-09 DIAGNOSIS — E113291 Type 2 diabetes mellitus with mild nonproliferative diabetic retinopathy without macular edema, right eye: Secondary | ICD-10-CM | POA: Diagnosis not present

## 2020-04-09 MED ORDER — VENLAFAXINE HCL ER 37.5 MG PO CP24: ORAL_CAPSULE | ORAL | 0 refills | Status: DC

## 2020-04-14 DIAGNOSIS — M1991 Primary osteoarthritis, unspecified site: Secondary | ICD-10-CM | POA: Diagnosis not present

## 2020-04-14 DIAGNOSIS — E1122 Type 2 diabetes mellitus with diabetic chronic kidney disease: Secondary | ICD-10-CM | POA: Diagnosis not present

## 2020-04-14 DIAGNOSIS — I129 Hypertensive chronic kidney disease with stage 1 through stage 4 chronic kidney disease, or unspecified chronic kidney disease: Secondary | ICD-10-CM | POA: Diagnosis not present

## 2020-04-14 DIAGNOSIS — E7849 Other hyperlipidemia: Secondary | ICD-10-CM | POA: Diagnosis not present

## 2020-04-30 DIAGNOSIS — M436 Torticollis: Secondary | ICD-10-CM | POA: Diagnosis not present

## 2020-05-07 DIAGNOSIS — G894 Chronic pain syndrome: Secondary | ICD-10-CM | POA: Diagnosis not present

## 2020-05-15 DIAGNOSIS — E1122 Type 2 diabetes mellitus with diabetic chronic kidney disease: Secondary | ICD-10-CM | POA: Diagnosis not present

## 2020-05-15 DIAGNOSIS — Z87891 Personal history of nicotine dependence: Secondary | ICD-10-CM | POA: Diagnosis not present

## 2020-05-15 DIAGNOSIS — I2510 Atherosclerotic heart disease of native coronary artery without angina pectoris: Secondary | ICD-10-CM | POA: Diagnosis not present

## 2020-05-15 DIAGNOSIS — M1991 Primary osteoarthritis, unspecified site: Secondary | ICD-10-CM | POA: Diagnosis not present

## 2020-05-15 DIAGNOSIS — I129 Hypertensive chronic kidney disease with stage 1 through stage 4 chronic kidney disease, or unspecified chronic kidney disease: Secondary | ICD-10-CM | POA: Diagnosis not present

## 2020-05-15 DIAGNOSIS — E7849 Other hyperlipidemia: Secondary | ICD-10-CM | POA: Diagnosis not present

## 2020-05-23 ENCOUNTER — Other Ambulatory Visit: Payer: Self-pay | Admitting: *Deleted

## 2020-05-23 MED ORDER — TAMOXIFEN CITRATE 20 MG PO TABS: 20.0000 mg | ORAL_TABLET | Freq: Every day | ORAL | 0 refills | Status: DC

## 2020-06-05 ENCOUNTER — Encounter (HOSPITAL_COMMUNITY): Payer: Self-pay | Admitting: Physical Therapy

## 2020-06-05 DIAGNOSIS — M1991 Primary osteoarthritis, unspecified site: Secondary | ICD-10-CM | POA: Diagnosis not present

## 2020-06-05 DIAGNOSIS — I10 Essential (primary) hypertension: Secondary | ICD-10-CM | POA: Diagnosis not present

## 2020-06-05 DIAGNOSIS — G4709 Other insomnia: Secondary | ICD-10-CM | POA: Diagnosis not present

## 2020-06-05 DIAGNOSIS — E1165 Type 2 diabetes mellitus with hyperglycemia: Secondary | ICD-10-CM | POA: Diagnosis not present

## 2020-06-05 NOTE — Therapy (Signed)
[place], [place], [address] Phone: [phone]   Fax:  [phone]  Patient Details  Name: [name] MRN: [id number] Date of Birth: Jul 01, 1955 Referring Provider:  No ref. provider found  Encounter Date: 06/05/2020  PHYSICAL THERAPY DISCHARGE SUMMARY  Visits from Start of Care: 2  Current functional level related to goals / functional outcomes: NA   Remaining deficits: NA   Education / Equipment: Patient requests self DC. Attended 2 total visits. Patient DC from therapy.  Plan: Patient agrees to discharge.  Patient goals were not met. Patient is being discharged due to the patient's request.  ?????        4:55 PM, 06/05/20 Josue Hector PT DPT  Physical Therapist with [facility]  (336) [phone] [place], [place], [address] Phone: [phone]   Fax:  [phone]

## 2020-06-14 DIAGNOSIS — R252 Cramp and spasm: Secondary | ICD-10-CM | POA: Diagnosis not present

## 2020-06-15 DIAGNOSIS — E7849 Other hyperlipidemia: Secondary | ICD-10-CM | POA: Diagnosis not present

## 2020-06-15 DIAGNOSIS — M1991 Primary osteoarthritis, unspecified site: Secondary | ICD-10-CM | POA: Diagnosis not present

## 2020-06-15 DIAGNOSIS — E1122 Type 2 diabetes mellitus with diabetic chronic kidney disease: Secondary | ICD-10-CM | POA: Diagnosis not present

## 2020-06-15 DIAGNOSIS — I129 Hypertensive chronic kidney disease with stage 1 through stage 4 chronic kidney disease, or unspecified chronic kidney disease: Secondary | ICD-10-CM | POA: Diagnosis not present

## 2020-06-28 ENCOUNTER — Other Ambulatory Visit: Payer: Self-pay | Admitting: *Deleted

## 2020-06-28 MED ORDER — VENLAFAXINE HCL ER 37.5 MG PO CP24: ORAL_CAPSULE | ORAL | 0 refills | Status: DC

## 2020-07-03 DIAGNOSIS — M1991 Primary osteoarthritis, unspecified site: Secondary | ICD-10-CM | POA: Diagnosis not present

## 2020-07-03 DIAGNOSIS — M5416 Radiculopathy, lumbar region: Secondary | ICD-10-CM | POA: Diagnosis not present

## 2020-07-03 DIAGNOSIS — G894 Chronic pain syndrome: Secondary | ICD-10-CM | POA: Diagnosis not present

## 2020-07-03 DIAGNOSIS — M791 Myalgia, unspecified site: Secondary | ICD-10-CM | POA: Diagnosis not present

## 2020-07-14 DIAGNOSIS — N1831 Chronic kidney disease, stage 3a: Secondary | ICD-10-CM | POA: Diagnosis not present

## 2020-07-14 DIAGNOSIS — E1122 Type 2 diabetes mellitus with diabetic chronic kidney disease: Secondary | ICD-10-CM | POA: Diagnosis not present

## 2020-07-14 DIAGNOSIS — E7849 Other hyperlipidemia: Secondary | ICD-10-CM | POA: Diagnosis not present

## 2020-07-14 DIAGNOSIS — I129 Hypertensive chronic kidney disease with stage 1 through stage 4 chronic kidney disease, or unspecified chronic kidney disease: Secondary | ICD-10-CM | POA: Diagnosis not present

## 2020-07-14 DIAGNOSIS — M1991 Primary osteoarthritis, unspecified site: Secondary | ICD-10-CM | POA: Diagnosis not present

## 2020-07-31 DIAGNOSIS — G894 Chronic pain syndrome: Secondary | ICD-10-CM | POA: Diagnosis not present

## 2020-07-31 DIAGNOSIS — E782 Mixed hyperlipidemia: Secondary | ICD-10-CM | POA: Diagnosis not present

## 2020-07-31 DIAGNOSIS — Z0000 Encounter for general adult medical examination without abnormal findings: Secondary | ICD-10-CM | POA: Diagnosis not present

## 2020-07-31 DIAGNOSIS — E7849 Other hyperlipidemia: Secondary | ICD-10-CM | POA: Diagnosis not present

## 2020-07-31 DIAGNOSIS — M5416 Radiculopathy, lumbar region: Secondary | ICD-10-CM | POA: Diagnosis not present

## 2020-07-31 DIAGNOSIS — M1991 Primary osteoarthritis, unspecified site: Secondary | ICD-10-CM | POA: Diagnosis not present

## 2020-07-31 DIAGNOSIS — G4709 Other insomnia: Secondary | ICD-10-CM | POA: Diagnosis not present

## 2020-07-31 DIAGNOSIS — Z1389 Encounter for screening for other disorder: Secondary | ICD-10-CM | POA: Diagnosis not present

## 2020-07-31 DIAGNOSIS — E119 Type 2 diabetes mellitus without complications: Secondary | ICD-10-CM | POA: Diagnosis not present

## 2020-08-13 DIAGNOSIS — E1122 Type 2 diabetes mellitus with diabetic chronic kidney disease: Secondary | ICD-10-CM | POA: Diagnosis not present

## 2020-08-13 DIAGNOSIS — M1991 Primary osteoarthritis, unspecified site: Secondary | ICD-10-CM | POA: Diagnosis not present

## 2020-08-13 DIAGNOSIS — E7849 Other hyperlipidemia: Secondary | ICD-10-CM | POA: Diagnosis not present

## 2020-08-13 DIAGNOSIS — N1831 Chronic kidney disease, stage 3a: Secondary | ICD-10-CM | POA: Diagnosis not present

## 2020-08-13 DIAGNOSIS — I129 Hypertensive chronic kidney disease with stage 1 through stage 4 chronic kidney disease, or unspecified chronic kidney disease: Secondary | ICD-10-CM | POA: Diagnosis not present

## 2020-08-28 DIAGNOSIS — M1991 Primary osteoarthritis, unspecified site: Secondary | ICD-10-CM | POA: Diagnosis not present

## 2020-08-28 DIAGNOSIS — G894 Chronic pain syndrome: Secondary | ICD-10-CM | POA: Diagnosis not present

## 2020-08-28 DIAGNOSIS — M5416 Radiculopathy, lumbar region: Secondary | ICD-10-CM | POA: Diagnosis not present

## 2020-08-29 ENCOUNTER — Other Ambulatory Visit: Payer: Self-pay

## 2020-08-29 MED ORDER — TAMOXIFEN CITRATE 20 MG PO TABS: 20.0000 mg | ORAL_TABLET | Freq: Every day | ORAL | 0 refills | Status: DC

## 2020-08-29 MED ORDER — VENLAFAXINE HCL ER 37.5 MG PO CP24: ORAL_CAPSULE | ORAL | 0 refills | Status: DC

## 2020-09-12 DIAGNOSIS — E1122 Type 2 diabetes mellitus with diabetic chronic kidney disease: Secondary | ICD-10-CM | POA: Diagnosis not present

## 2020-09-12 DIAGNOSIS — E7849 Other hyperlipidemia: Secondary | ICD-10-CM | POA: Diagnosis not present

## 2020-09-12 DIAGNOSIS — N1831 Chronic kidney disease, stage 3a: Secondary | ICD-10-CM | POA: Diagnosis not present

## 2020-09-12 DIAGNOSIS — I129 Hypertensive chronic kidney disease with stage 1 through stage 4 chronic kidney disease, or unspecified chronic kidney disease: Secondary | ICD-10-CM | POA: Diagnosis not present

## 2020-10-01 DIAGNOSIS — J019 Acute sinusitis, unspecified: Secondary | ICD-10-CM | POA: Diagnosis not present

## 2020-10-01 DIAGNOSIS — G894 Chronic pain syndrome: Secondary | ICD-10-CM | POA: Diagnosis not present

## 2020-10-01 DIAGNOSIS — M1991 Primary osteoarthritis, unspecified site: Secondary | ICD-10-CM | POA: Diagnosis not present

## 2020-10-01 DIAGNOSIS — M5416 Radiculopathy, lumbar region: Secondary | ICD-10-CM | POA: Diagnosis not present

## 2020-10-13 DIAGNOSIS — E7849 Other hyperlipidemia: Secondary | ICD-10-CM | POA: Diagnosis not present

## 2020-10-13 DIAGNOSIS — I129 Hypertensive chronic kidney disease with stage 1 through stage 4 chronic kidney disease, or unspecified chronic kidney disease: Secondary | ICD-10-CM | POA: Diagnosis not present

## 2020-10-13 DIAGNOSIS — E1122 Type 2 diabetes mellitus with diabetic chronic kidney disease: Secondary | ICD-10-CM | POA: Diagnosis not present

## 2020-10-13 DIAGNOSIS — N1831 Chronic kidney disease, stage 3a: Secondary | ICD-10-CM | POA: Diagnosis not present

## 2020-10-31 DIAGNOSIS — E7849 Other hyperlipidemia: Secondary | ICD-10-CM | POA: Diagnosis not present

## 2020-10-31 DIAGNOSIS — E1140 Type 2 diabetes mellitus with diabetic neuropathy, unspecified: Secondary | ICD-10-CM | POA: Diagnosis not present

## 2020-10-31 DIAGNOSIS — M1991 Primary osteoarthritis, unspecified site: Secondary | ICD-10-CM | POA: Diagnosis not present

## 2020-10-31 DIAGNOSIS — R201 Hypoesthesia of skin: Secondary | ICD-10-CM | POA: Diagnosis not present

## 2020-10-31 DIAGNOSIS — E1129 Type 2 diabetes mellitus with other diabetic kidney complication: Secondary | ICD-10-CM | POA: Diagnosis not present

## 2020-10-31 DIAGNOSIS — G894 Chronic pain syndrome: Secondary | ICD-10-CM | POA: Diagnosis not present

## 2020-10-31 LAB — HEMOGLOBIN A1C: Hemoglobin A1C: 9

## 2020-10-31 LAB — MICROALBUMIN, URINE: Microalb, Ur: 12.8

## 2020-11-13 DIAGNOSIS — N1831 Chronic kidney disease, stage 3a: Secondary | ICD-10-CM | POA: Diagnosis not present

## 2020-11-13 DIAGNOSIS — I129 Hypertensive chronic kidney disease with stage 1 through stage 4 chronic kidney disease, or unspecified chronic kidney disease: Secondary | ICD-10-CM | POA: Diagnosis not present

## 2020-11-13 DIAGNOSIS — E7849 Other hyperlipidemia: Secondary | ICD-10-CM | POA: Diagnosis not present

## 2020-11-13 DIAGNOSIS — E1122 Type 2 diabetes mellitus with diabetic chronic kidney disease: Secondary | ICD-10-CM | POA: Diagnosis not present

## 2020-11-15 ENCOUNTER — Ambulatory Visit (INDEPENDENT_AMBULATORY_CARE_PROVIDER_SITE_OTHER): Payer: Medicare Other | Admitting: Orthopaedic Surgery

## 2020-11-15 ENCOUNTER — Encounter: Payer: Self-pay | Admitting: Orthopaedic Surgery

## 2020-11-15 ENCOUNTER — Other Ambulatory Visit: Payer: Self-pay

## 2020-11-15 ENCOUNTER — Ambulatory Visit: Payer: Medicare Other

## 2020-11-15 VITALS — BP 124/83 | HR 97 | Ht 64.0 in | Wt 213.0 lb

## 2020-11-15 DIAGNOSIS — G894 Chronic pain syndrome: Secondary | ICD-10-CM | POA: Diagnosis not present

## 2020-11-15 DIAGNOSIS — G8929 Other chronic pain: Secondary | ICD-10-CM

## 2020-11-15 DIAGNOSIS — M25562 Pain in left knee: Secondary | ICD-10-CM

## 2020-11-15 DIAGNOSIS — G5732 Lesion of lateral popliteal nerve, left lower limb: Secondary | ICD-10-CM | POA: Diagnosis not present

## 2020-11-15 NOTE — Progress Notes (Signed)
Subjective:    Patient ID: [name], female    DOB: 23-Jul-1955, 65 y.o.   MRN: [id number]  HPI She has about a year long history of left knee pain that is getting worse.  She has swelling, popping and giving way.  She has no redness, no trauma.  She has had back surgery in the past with multiple fusions.  She has left sided residual weakness and sciatica.  She has left foot drop.  She has been followed by [facility] and neurologist.  I have reviewed both of their multiple notes.  She has had EMG and MRI of the lumbar spine.  I have reviewed those reports as well as X-rays.  I have independently reviewed the MRI.     I have independently reviewed and interpreted x-rays of this patient done at another site by another physician or qualified health professional.  She has chronic pain syndrome.  She has type II diabetes.  She has breathing problems at times.     Review of Systems  Constitutional: Positive for activity change.  Respiratory: Positive for shortness of breath.   Musculoskeletal: Positive for arthralgias, back pain, gait problem, joint swelling and myalgias.  Neurological: Positive for headaches.  Psychiatric/Behavioral: The patient is nervous/anxious.   All other systems reviewed and are negative.  For Review of Systems, all other systems reviewed and are negative.  The following is a summary of the past history medically, past history surgically, known current medicines, social history and family history.  This information is gathered electronically by the computer from prior information and documentation.  I review this each visit and have found including this information at this point in the chart is beneficial and informative.   Past Medical History:  Diagnosis Date  . Anxiety   . Asthma   . Cancer [facility])    Left breast  . Complication of anesthesia    pt states she is hard to wake up after anesthetic-"she was told this at [facility]."   .  Constipation   . Constipation   . DDD (degenerative disc disease), lumbar   . Depression    situational  . Diabetes mellitus    Type II  . Dyspnea   . Family history of adverse reaction to anesthesia    Brother- N/V  . Fibromyalgia   . Gait instability   . GERD (gastroesophageal reflux disease)   . Headache   . History of blood transfusion    with childbirth  . History of radiation therapy 12/01/16-01/08/17   left breast 50.4 Gy in 28 fractions  . Hypertension   . Pneumonia 2008  . PONV (postoperative nausea and vomiting)   . Skin cancer   . Stroke [facility])    mini stroke 2008    Past Surgical History:  Procedure Laterality Date  . ABDOMINAL HYSTERECTOMY     Cervical- cancer cell  . ANTERIOR CERVICAL DISCECTOMY  2007  . BACK SURGERY  2009   Laminectomy  . Back surgery 4 yrs ago  2008  . BALLOON DILATION  10/02/2011   Procedure: BALLOON DILATION;  Surgeon: [name], MD;  [facility];  Service: Endoscopy;  Laterality: N/A;  . BREAST LUMPECTOMY WITH RADIOACTIVE SEED AND SENTINEL LYMPH NODE BIOPSY Left 10/13/2016   Procedure: BREAST LUMPECTOMY WITH RADIOACTIVE SEED AND SENTINEL LYMPH NODE BIOPSY;  Surgeon: [name], MD;  Location: [place];  Service: General;  Laterality: Left;  . CHOLECYSTECTOMY    . COLONOSCOPY  WITH PROPOFOL N/A 04/18/2016   Procedure: COLONOSCOPY WITH PROPOFOL;  Surgeon: [name], MD;  Location: [facility];  Service: Endoscopy;  Laterality: N/A;  955  . FOOT SURGERY     tried to remove bone spur- was unable- "just clean"  . MALONEY DILATION  10/02/2011   Procedure: MALONEY DILATION;  Surgeon: [name], MD;  Location: [facility];  Service: Endoscopy;  Laterality: N/A;  . SAVORY DILATION  10/02/2011   Procedure: SAVORY DILATION;  Surgeon: [name], MD;  [facility];  Service: Endoscopy;  Laterality: N/A;    Current Outpatient Medications on File Prior to Visit  Medication Sig Dispense Refill  .  acarbose (PRECOSE) 50 MG tablet Take 50 mg by mouth daily.    Marland Kitchen ALPRAZolam (XANAX) 1 MG tablet Take 1 mg by mouth 4 (four) times daily as needed. Reports taking 0.5 tab QHS.    Marland Kitchen atenolol (TENORMIN) 25 MG tablet Take 25 mg by mouth daily.     Marland Kitchen atorvastatin (LIPITOR) 20 MG tablet Take 20 mg by mouth daily.    Marland Kitchen dicyclomine (BENTYL) 20 MG tablet Take 1 every 8-12 hours as needed for abdominal cramping 15 tablet 0  . Dulaglutide (TRULICITY) 1.5 BZ/1.6RC SOPN Inject 1.5 mg into the skin once a week.    . furosemide (LASIX) 20 MG tablet Take 20 mg by mouth as needed for edema.    . gabapentin (NEURONTIN) 300 MG capsule Take 300 mg by mouth 3 (three) times daily as needed.    Marland Kitchen glimepiride (AMARYL) 4 MG tablet Take 4 mg by mouth 2 (two) times daily.    Marland Kitchen lidocaine (LIDODERM) 5 % Place 1 patch onto the skin daily as needed (pain). Remove & Discard patch within 12 hours or as directed by MD     . losartan (COZAAR) 25 MG tablet Take 25 mg by mouth daily.    . metFORMIN (GLUCOPHAGE) 1000 MG tablet Take 1,000 mg by mouth daily.     . ondansetron (ZOFRAN ODT) 4 MG disintegrating tablet 4mg  ODT q4 hours prn nausea/vomit 12 tablet 0  . Oxycodone HCl 10 MG TABS Take 10 mg by mouth every 6 (six) hours as needed (pain).     . pantoprazole (PROTONIX) 40 MG tablet Take 40 mg by mouth daily.    . tamoxifen (NOLVADEX) 20 MG tablet Take 1 tablet (20 mg total) by mouth daily. 90 tablet 0  . venlafaxine XR (EFFEXOR-XR) 37.5 MG 24 hr capsule TAKE ONE CAPSULE (37.5MG  TOTAL) BY MOUTHDAILY WITH BREAKFAS 90 capsule 0  . zolpidem (AMBIEN) 10 MG tablet Take 10 mg by mouth at bedtime as needed.     No current facility-administered medications on file prior to visit.    Social History   Socioeconomic History  . Marital status: Married    Spouse name: Not on file  . Number of children: 2  . Years of education: 11  . Highest education level: High school graduate  Occupational History  . Occupation: disabled  Tobacco  Use  . Smoking status: Never Smoker  . Smokeless tobacco: Never Used  Vaping Use  . Vaping Use: Never used  Substance and Sexual Activity  . Alcohol use: Yes    Alcohol/week: 0.0 standard drinks    Comment: glass of wine- occasional  . Drug use: Never  . Sexual activity: Not on file  Other Topics Concern  . Not on file  Social History Narrative   Lives at home  with husband.   Left-handed.   10oz caffeine per day.   Social Determinants of Health   Financial Resource Strain: Not on file  Food Insecurity: Not on file  Transportation Needs: Not on file  Physical Activity: Not on file  Stress: Not on file  Social Connections: Not on file  Intimate Partner Violence: Not on file    Family History  Problem Relation Age of Onset  . Heart attack Mother   . Lung cancer Father     BP 124/83   Pulse 97   Ht 5\' 4"  (1.626 m)   Wt 213 lb (96.6 kg)   BMI 36.56 kg/m   Body mass index is 36.56 kg/m.      Objective:   Physical Exam Vitals and nursing note reviewed. Exam conducted with a chaperone present.  Constitutional:      Appearance: She is well-developed.  HENT:     Head: Normocephalic and atraumatic.  Eyes:     Conjunctiva/sclera: Conjunctivae normal.     Pupils: Pupils are equal, round, and reactive to light.  Cardiovascular:     Rate and Rhythm: Normal rate and regular rhythm.  Pulmonary:     Effort: Pulmonary effort is normal.  Abdominal:     Palpations: Abdomen is soft.  Musculoskeletal:     Cervical back: Normal range of motion and neck supple.       Legs:  Skin:    General: Skin is warm and dry.  Neurological:     Mental Status: She is alert and oriented to person, place, and time.     Cranial Nerves: No cranial nerve deficit.     Motor: No abnormal muscle tone.     Coordination: Coordination normal.     Deep Tendon Reflexes: Reflexes are normal and symmetric. Reflexes normal.  Psychiatric:        Behavior: Behavior normal.        Thought Content:  Thought content normal.        Judgment: Judgment normal.           Assessment & Plan:   Encounter Diagnoses  Name Primary?  . Chronic pain of left knee Yes  . Peroneal nerve palsy, left   . Chronic pain syndrome    I will get MRI of the left knee.  I am concerned about meniscus tear.  I will order AFO for the left lower leg, Rx given.  PROCEDURE NOTE:  The patient requests injections of the left knee , verbal consent was obtained.  The left knee was prepped appropriately after time out was performed.   Sterile technique was observed and injection of 1 cc of DepoMedrol 40 mg with several cc's of plain xylocaine. Anesthesia was provided by ethyl chloride and a 20-gauge needle was used to inject the knee area. The injection was tolerated well.  A band aid dressing was applied.  The patient was advised to apply ice later today and tomorrow to the injection sight as needed.  Return in three weeks.  Call if any problem.  Precautions discussed.   Electronically Signed Sanjuana Kava, MD 6/2/202210:53 AM

## 2020-11-19 ENCOUNTER — Telehealth: Payer: Self-pay | Admitting: Orthopaedic Surgery

## 2020-11-19 NOTE — Telephone Encounter (Signed)
Patient needs you to call her back about her script for left ankle foot.   They place she called said the script is enough, they need office notes and they are out of network with her insurance.  Please call her back at .[phone].

## 2020-11-21 NOTE — Telephone Encounter (Signed)
I sent this to Dr. Luna Glasgow as a staff message and got his reply. I called patient and relayed the response from Dr. Luna Glasgow to her and she will be coming by to pick up a copy of her office note to take with her, just in case they need it she said.

## 2020-11-22 ENCOUNTER — Telehealth: Payer: Self-pay | Admitting: Orthopaedic Surgery

## 2020-11-22 ENCOUNTER — Ambulatory Visit: Payer: Medicare Other | Admitting: Orthopedic Surgery

## 2020-11-22 DIAGNOSIS — M25462 Effusion, left knee: Secondary | ICD-10-CM | POA: Diagnosis not present

## 2020-11-22 DIAGNOSIS — M238X2 Other internal derangements of left knee: Secondary | ICD-10-CM | POA: Diagnosis not present

## 2020-11-22 DIAGNOSIS — M67962 Unspecified disorder of synovium and tendon, left lower leg: Secondary | ICD-10-CM | POA: Diagnosis not present

## 2020-11-22 DIAGNOSIS — M67862 Other specified disorders of synovium, left knee: Secondary | ICD-10-CM | POA: Diagnosis not present

## 2020-11-22 DIAGNOSIS — S83242A Other tear of medial meniscus, current injury, left knee, initial encounter: Secondary | ICD-10-CM | POA: Diagnosis not present

## 2020-11-22 DIAGNOSIS — R936 Abnormal findings on diagnostic imaging of limbs: Secondary | ICD-10-CM | POA: Diagnosis not present

## 2020-11-22 DIAGNOSIS — M94262 Chondromalacia, left knee: Secondary | ICD-10-CM | POA: Diagnosis not present

## 2020-11-22 DIAGNOSIS — R600 Localized edema: Secondary | ICD-10-CM | POA: Diagnosis not present

## 2020-11-22 NOTE — Telephone Encounter (Signed)
Please call patient back it is about her brace.  Thanks

## 2020-11-26 NOTE — Telephone Encounter (Signed)
I tried to call patient , but when the phone was picked up I could hear her talking, but she never acknowledged me on the phone. I will try again before leaving for the day.

## 2020-11-29 ENCOUNTER — Other Ambulatory Visit: Payer: Self-pay | Admitting: Oncology

## 2020-11-30 DIAGNOSIS — M1991 Primary osteoarthritis, unspecified site: Secondary | ICD-10-CM | POA: Diagnosis not present

## 2020-11-30 DIAGNOSIS — G894 Chronic pain syndrome: Secondary | ICD-10-CM | POA: Diagnosis not present

## 2020-12-07 ENCOUNTER — Ambulatory Visit: Payer: Medicare Other | Admitting: Nurse Practitioner

## 2020-12-07 ENCOUNTER — Encounter: Payer: Self-pay | Admitting: Nurse Practitioner

## 2020-12-07 ENCOUNTER — Other Ambulatory Visit: Payer: Self-pay

## 2020-12-07 VITALS — BP 120/79 | HR 85 | Ht 64.0 in | Wt 214.0 lb

## 2020-12-07 DIAGNOSIS — I10 Essential (primary) hypertension: Secondary | ICD-10-CM

## 2020-12-07 DIAGNOSIS — E782 Mixed hyperlipidemia: Secondary | ICD-10-CM | POA: Diagnosis not present

## 2020-12-07 DIAGNOSIS — E119 Type 2 diabetes mellitus without complications: Secondary | ICD-10-CM

## 2020-12-07 NOTE — Progress Notes (Signed)
Endocrinology Consult Note       12/07/2020, 9:32 AM   Subjective:    Patient ID: [name], female    DOB: January 14, 1956.  [name] is being seen in consultation for management of currently uncontrolled symptomatic diabetes requested by  Redmond School, MD.   Past Medical History:  Diagnosis Date   Anxiety    Asthma    Cancer [facility])    Left breast   Complication of anesthesia    pt states she is hard to wake up after anesthetic-"she was told this at [facility]."    Constipation    Constipation    DDD (degenerative disc disease), lumbar    Depression    situational   Diabetes mellitus    Type II   Dyspnea    Family history of adverse reaction to anesthesia    Brother- N/V   Fibromyalgia    Gait instability    GERD (gastroesophageal reflux disease)    Headache    History of blood transfusion    with childbirth   History of radiation therapy 12/01/16-01/08/17   left breast 50.4 Gy in 28 fractions   Hypertension    Pneumonia 2008   PONV (postoperative nausea and vomiting)    Skin cancer    Stroke [facility])    mini stroke 2008    Past Surgical History:  Procedure Laterality Date   ABDOMINAL HYSTERECTOMY     Cervical- cancer cell   ANTERIOR CERVICAL DISCECTOMY  2007   BACK SURGERY  2009   Laminectomy   Back surgery 4 yrs ago  2008   BALLOON DILATION  10/02/2011   Procedure: BALLOON DILATION;  Surgeon: [name], MD;  [facility];  Service: Endoscopy;  Laterality: N/A;   BREAST LUMPECTOMY WITH RADIOACTIVE SEED AND SENTINEL LYMPH NODE BIOPSY Left 10/13/2016   Procedure: BREAST LUMPECTOMY WITH RADIOACTIVE SEED AND SENTINEL LYMPH NODE BIOPSY;  Surgeon: [name], MD;  Location: [facility];  Service: General;  Laterality: Left;   CHOLECYSTECTOMY     COLONOSCOPY WITH PROPOFOL N/A 04/18/2016   Procedure: COLONOSCOPY WITH PROPOFOL;  Surgeon: [name], MD;   Location: [facility];  Service: Endoscopy;  Laterality: N/A;  955   FOOT SURGERY     tried to remove bone spur- was unable- "just clean"   MALONEY DILATION  10/02/2011   Procedure: MALONEY DILATION;  Surgeon: [name], MD;  [facility];  Service: Endoscopy;  Laterality: N/A;   SAVORY DILATION  10/02/2011   Procedure: SAVORY DILATION;  Surgeon: [name], MD;  [facility];  Service: Endoscopy;  Laterality: N/A;    Social History   Socioeconomic History   Marital status: Married    Spouse name: Not on file   Number of children: 2   Years of education: 12   Highest education level: High school graduate  Occupational History   Occupation: disabled  Tobacco Use   Smoking status: Never   Smokeless tobacco: Never  Vaping Use   Vaping Use: Never used  Substance and Sexual Activity   Alcohol use: Yes    Alcohol/week: 0.0 standard drinks  Comment: glass of wine- occasional   Drug use: Never   Sexual activity: Not on file  Other Topics Concern   Not on file  Social History Narrative   Lives at home with husband.   Left-handed.   10oz caffeine per day.   Social Determinants of Health   Financial Resource Strain: Not on file  Food Insecurity: Not on file  Transportation Needs: Not on file  Physical Activity: Not on file  Stress: Not on file  Social Connections: Not on file    Family History  Problem Relation Age of Onset   Heart attack Mother    Lung cancer Father     Outpatient Encounter Medications as of 12/07/2020  Medication Sig   ALPRAZolam (XANAX) 1 MG tablet Take 1 mg by mouth 4 (four) times daily as needed. Reports taking 0.5 tab QHS.   atenolol (TENORMIN) 25 MG tablet Take 25 mg by mouth daily.    atorvastatin (LIPITOR) 20 MG tablet Take 20 mg by mouth daily.   dicyclomine (BENTYL) 20 MG tablet Take 1 every 8-12 hours as needed for abdominal cramping   Dulaglutide (TRULICITY) 1.5 YQ/6.5HQ SOPN Inject 1.5 mg into the skin  once a week.   furosemide (LASIX) 20 MG tablet Take 20 mg by mouth as needed for edema.   gabapentin (NEURONTIN) 300 MG capsule Take 300 mg by mouth 3 (three) times daily as needed.   glimepiride (AMARYL) 4 MG tablet Take 2 mg by mouth 2 (two) times daily.   lidocaine (LIDODERM) 5 % Place 1 patch onto the skin daily as needed (pain). Remove & Discard patch within 12 hours or as directed by MD    losartan (COZAAR) 25 MG tablet Take 25 mg by mouth daily.   metFORMIN (GLUCOPHAGE) 1000 MG tablet Take 500 mg by mouth daily.   Oxycodone HCl 10 MG TABS Take 10 mg by mouth every 6 (six) hours as needed (pain).    pantoprazole (PROTONIX) 40 MG tablet Take 40 mg by mouth daily.   tamoxifen (NOLVADEX) 20 MG tablet TAKE ONE TABLET BY MOUTH EVERY MORNING (Patient taking differently: 20 mg daily.)   venlafaxine XR (EFFEXOR-XR) 37.5 MG 24 hr capsule TAKE ONE TABLET BY MOUTH EVERY MORNING WITH BREAKFAST   [DISCONTINUED] acarbose (PRECOSE) 50 MG tablet Take 50 mg by mouth daily.   zolpidem (AMBIEN) 10 MG tablet Take 10 mg by mouth at bedtime as needed. (Patient not taking: Reported on 12/07/2020)   [DISCONTINUED] ondansetron (ZOFRAN ODT) 4 MG disintegrating tablet 4mg  ODT q4 hours prn nausea/vomit   No facility-administered encounter medications on file as of 12/07/2020.    ALLERGIES: Allergies  Allergen Reactions   Adhesive [Tape] Other (See Comments)    Tears skin   Elemental Sulfur Other (See Comments)    Turns very red   Lyrica [Pregabalin] Swelling   Amoxicillin-Pot Clavulanate Nausea And Vomiting   Codeine Itching   Penicillins Other (See Comments)    Upset stomach   Sulfonamide Derivatives Other (See Comments)    Burning sensation    VACCINATION STATUS: Immunization History  Administered Date(s) Administered   Moderna Sars-Covid-2 Vaccination 08/27/2019, 09/28/2019    Diabetes She presents for her initial diabetic visit. She has type 2 diabetes mellitus. Onset time: diagnosed at approx  age of 71. Her disease course has been stable. There are no hypoglycemic associated symptoms. There are no hypoglycemic complications. Symptoms are stable. Diabetic complications include a CVA. Risk factors for coronary artery disease include diabetes mellitus, dyslipidemia, family  history, hypertension, obesity, post-menopausal, sedentary lifestyle and stress. Current diabetic treatment includes oral agent (triple therapy) (Currently on Metformin 355 BId, Trulicity 3 mg; Glimepiride 4 mg BID, Acarbose 50 BID). She is compliant with treatment most of the time. Her weight is stable. She is following a generally unhealthy diet. When asked about meal planning, she reported none. She has not had a previous visit with a dietitian. She rarely participates in exercise. (She presents today for her consultation with no meter or logs to review.  Her most recent A1c was 9% on 5/18.  She reports a significant amount of stress lately, had 2 deaths within the last week and also had steroid shot in left knee which will need surgery in the coming months.  She typically monitors her glucose twice daily.  She admits to drinking mostly water, with 1 diet Mt. Dew daily and zero sugar gatorade.  She does not eat on a routine basis, sometimes skips meals.  She does not engage in routine physical activity due to musculoskeletal issues, but has plans to start doing exercises in her pool.  She denies any hypoglycemia.  She has lots of nausea, preventing her from taking her Metformin on some days.) An ACE inhibitor/angiotensin II receptor blocker is being taken. She does not see a podiatrist.Eye exam is current.  Hypertension This is a chronic problem. The current episode started more than 1 year ago. The problem has been resolved since onset. The problem is controlled. There are no associated agents to hypertension. Risk factors for coronary artery disease include diabetes mellitus, dyslipidemia, family history, obesity, post-menopausal  state, sedentary lifestyle and stress. Past treatments include angiotensin blockers, diuretics and beta blockers. The current treatment provides significant improvement. There are no compliance problems.  Hypertensive end-organ damage includes CVA.  Hyperlipidemia This is a chronic problem. The current episode started more than 1 year ago. Exacerbating diseases include diabetes and obesity. Factors aggravating her hyperlipidemia include beta blockers and fatty foods. Current antihyperlipidemic treatment includes statins. Compliance problems include adherence to diet and adherence to exercise.  Risk factors for coronary artery disease include diabetes mellitus, dyslipidemia, family history, obesity, hypertension, post-menopausal, a sedentary lifestyle and stress.    Review of systems  Constitutional: + Minimally fluctuating body weight, current Body mass index is 36.73 kg/m., no fatigue, no subjective hyperthermia, no subjective hypothermia Eyes: no blurry vision, no xerophthalmia ENT: no sore throat, no nodules palpated in throat, no dysphagia/odynophagia, no hoarseness Cardiovascular: no chest pain, no shortness of breath, no palpitations, ++ leg swelling Respiratory: no cough, no shortness of breath Gastrointestinal:  + nausea with occasional vomiting Genitourinary: + polyuria, stress incontinence Musculoskeletal: chronic pain, back and knees Skin: no rashes, no hyperemia Neurological: no tremors, no numbness, no tingling, no dizziness Psychiatric: no depression, no anxiety, reports high stress level currently  Objective:     BP 120/79   Pulse 85   Ht 5\' 4"  (1.626 m)   Wt 214 lb (97.1 kg)   BMI 36.73 kg/m   Wt Readings from Last 3 Encounters:  12/07/20 214 lb (97.1 kg)  11/15/20 213 lb (96.6 kg)  02/09/20 212 lb (96.2 kg)     BP Readings from Last 3 Encounters:  12/07/20 120/79  11/15/20 124/83  02/09/20 131/71     Physical Exam- Limited  Constitutional:  Body mass index  is 36.73 kg/m. , not in acute distress, normal state of mind Eyes:  EOMI, no exophthalmos Neck: Supple Cardiovascular: RRR, no murmurs, rubs, or gallops, +  nonpitting edema to BLE Respiratory: Adequate breathing efforts, no crackles, rales, rhonchi, or wheezing Musculoskeletal: no gross deformities, strength intact in all four extremities, no gross restriction of joint movements Skin:  no rashes, no hyperemia Neurological: no tremor with outstretched hands    CMP ( most recent) CMP     Component Value Date/Time   NA 138 02/09/2020 0843   NA 140 05/05/2017 1431   K 4.5 02/09/2020 0843   K 4.4 05/05/2017 1431   CL 103 02/09/2020 0843   CO2 28 02/09/2020 0843   CO2 26 05/05/2017 1431   GLUCOSE 229 (H) 02/09/2020 0843   GLUCOSE 194 (H) 05/05/2017 1431   BUN 12 02/09/2020 0843   BUN 15.0 05/05/2017 1431   CREATININE 1.21 (H) 02/09/2020 0843   CREATININE 1.2 (H) 05/05/2017 1431   CALCIUM 10.0 02/09/2020 0843   CALCIUM 9.7 05/05/2017 1431   PROT 6.5 02/09/2020 0843   PROT 7.3 05/05/2017 1431   ALBUMIN 3.5 02/09/2020 0843   ALBUMIN 3.8 05/05/2017 1431   AST 29 02/09/2020 0843   AST 39 (H) 05/05/2017 1431   ALT 28 02/09/2020 0843   ALT 51 05/05/2017 1431   ALKPHOS 69 02/09/2020 0843   ALKPHOS 74 05/05/2017 1431   BILITOT 0.8 02/09/2020 0843   BILITOT 0.77 05/05/2017 1431   GFRNONAA 48 (L) 02/09/2020 0843   GFRAA 56 (L) 02/09/2020 0843     Diabetic Labs (most recent): Lab Results  Component Value Date   HGBA1C 9 10/31/2020     Lipid Panel ( most recent) Lipid Panel  No results found for: CHOL, TRIG, HDL, CHOLHDL, VLDL, LDLCALC, LDLDIRECT, LABVLDL    Lab Results  Component Value Date   TSH 0.952 09/12/2019           Assessment & Plan:   1) Type 2 diabetes mellitus without complication, without long-term current use of insulin ([place])  She presents today for her consultation with no meter or logs to review.  Her most recent A1c was 9% on 5/18.  She reports a  significant amount of stress lately, had 2 deaths within the last week and also had steroid shot in left knee which will need surgery in the coming months.  She typically monitors her glucose twice daily.  She admits to drinking mostly water, with 1 diet Mt. Dew daily and zero sugar gatorade.  She does not eat on a routine basis, sometimes skips meals.  She does not engage in routine physical activity due to musculoskeletal issues, but has plans to start doing exercises in her pool.  She denies any hypoglycemia.  She has lots of nausea, preventing her from taking her Metformin on some days.  - [name] has currently uncontrolled symptomatic type 2 DM since 65 years of age, with most recent A1c of 9 %.   -Recent labs reviewed.  - I had a long discussion with her about the progressive nature of diabetes and the pathology behind its complications. -her diabetes is complicated by CVA and she remains at a high risk for more acute and chronic complications which include CAD, CVA, CKD, retinopathy, and neuropathy. These are all discussed in detail with her.  - I have counseled her on diet and weight management by adopting a carbohydrate restricted/protein rich diet. Patient is encouraged to switch to unprocessed or minimally processed complex starch and increased protein intake (animal or plant source), fruits, and vegetables. -  she is advised to stick to a routine mealtimes to eat 3  meals a day and avoid unnecessary snacks (to snack only to correct hypoglycemia).   - she acknowledges that there is a room for improvement in her food and drink choices. - Suggestion is made for her to avoid simple carbohydrates from her diet including Cakes, Sweet Desserts, Ice Cream, Soda (diet and regular), Sweet Tea, Candies, Chips, Cookies, Store Bought Juices, Alcohol in Excess of 1-2 drinks a day, Artificial Sweeteners, Coffee Creamer, and "Sugar-free" Products. This will help patient to have more stable blood  glucose profile and potentially avoid unintended weight gain.  - I have approached her with the following individualized plan to manage her diabetes and patient agrees:   -She is advised to lower her dose of Metformin to 500 mg ER daily with supper (to avoid GI side effects), to lower her dose of Glimepiride to 2 mg po twice daily with meals, and to continue her Trulicity at 3 mg SQ weekly (just started recently and tolerating well).  May increase to 4.5 mg dose at next visit depending on toleration.  -she is encouraged to start monitoring glucose 4 times daily, before meals and before bed, to log their readings on the clinic sheets provided, and bring them to review at follow up appointment in 2 weeks.  - Adjustment parameters are given to her for hypo and hyperglycemia in writing. - she is encouraged to call clinic for blood glucose levels less than 70 or above 300 mg /dl.  - her Acarbose will be discontinued, risk outweighs benefit for this patient (may be contributing to her daily nausea).  - Specific targets for  A1c; LDL, HDL, and Triglycerides were discussed with the patient.  2) Blood Pressure /Hypertension:  her blood pressure is controlled to target.   she is advised to continue her current medications including Atenolol 25 mg po daily, Lasix 20 mg as needed for edema, and Losartan 25 mg po daily.  Will check kidney function prior to next visit.  3) Lipids/Hyperlipidemia:    She does not have recent lipid panel to review.  she is advised to continue Lipitor 20 mg daily at bedtime.  Side effects and precautions discussed with her.  Will recheck lipids prior to next visit.  4)  Weight/Diet:  her Body mass index is 36.73 kg/m.  -  clearly complicating her diabetes care.   she is a candidate for weight loss. I discussed with her the fact that loss of 5 - 10% of her  current body weight will have the most impact on her diabetes management.  Exercise, and detailed carbohydrates information  provided  -  detailed on discharge instructions.  5) Chronic Care/Health Maintenance: -she is on ACEI/ARB and Statin medications and is encouraged to initiate and continue to follow up with Ophthalmology, Dentist, Podiatrist at least yearly or according to recommendations, and advised to stay away from smoking. I have recommended yearly flu vaccine and pneumonia vaccine at least every 5 years; moderate intensity exercise for up to 150 minutes weekly; and sleep for at least 7 hours a day.  - she is advised to maintain close follow up with Redmond School, MD for primary care needs, as well as her other providers for optimal and coordinated care.   - Time spent in this patient care: 60 min, of which > 50% was spent in counseling her about her diabetes and the rest reviewing her blood glucose logs, discussing her hypoglycemia and hyperglycemia episodes, reviewing her current and previous labs/studies (including abstraction from other facilities) and  medications doses and developing a long term treatment plan based on the latest standards of care/guidelines; and documenting her care.    Please refer to Patient Instructions for Blood Glucose Monitoring and Insulin/Medications Dosing Guide" in media tab for additional information. Please also refer to "Patient Self Inventory" in the Media tab for reviewed elements of pertinent patient history.  [name] participated in the discussions, expressed understanding, and voiced agreement with the above plans.  All questions were answered to her satisfaction. she is encouraged to contact clinic should she have any questions or concerns prior to her return visit.     Follow up plan: - Return in about 2 weeks (around 12/21/2020) for Diabetes F/U, Previsit labs, Bring meter and logs.    Rayetta Pigg, [employer] [facility] [address] [place], [place] [address] Phone: [phone] Fax: [phone]  12/07/2020, 9:32  AM

## 2020-12-07 NOTE — Patient Instructions (Signed)
Diabetes Mellitus and Nutrition, Adult When you have diabetes, or diabetes mellitus, it is very important to have healthy eating habits because your blood sugar (glucose) levels are greatly affected by what you eat and drink. Eating healthy foods in the right amounts, at about the same times every day, can help you:  Control your blood glucose.  Lower your risk of heart disease.  Improve your blood pressure.  Reach or maintain a healthy weight. What can affect my meal plan? Every person with diabetes is different, and each person has different needs for a meal plan. Your health care provider may recommend that you work with a dietitian to make a meal plan that is best for you. Your meal plan may vary depending on factors such as:  The calories you need.  The medicines you take.  Your weight.  Your blood glucose, blood pressure, and cholesterol levels.  Your activity level.  Other health conditions you have, such as heart or kidney disease. How do carbohydrates affect me? Carbohydrates, also called carbs, affect your blood glucose level more than any other type of food. Eating carbs naturally raises the amount of glucose in your blood. Carb counting is a method for keeping track of how many carbs you eat. Counting carbs is important to keep your blood glucose at a healthy level, especially if you use insulin or take certain oral diabetes medicines. It is important to know how many carbs you can safely have in each meal. This is different for every person. Your dietitian can help you calculate how many carbs you should have at each meal and for each snack. How does alcohol affect me? Alcohol can cause a sudden decrease in blood glucose (hypoglycemia), especially if you use insulin or take certain oral diabetes medicines. Hypoglycemia can be a life-threatening condition. Symptoms of hypoglycemia, such as sleepiness, dizziness, and confusion, are similar to symptoms of having too much  alcohol.  Do not drink alcohol if: ? Your health care provider tells you not to drink. ? You are pregnant, may be pregnant, or are planning to become pregnant.  If you drink alcohol: ? Do not drink on an empty stomach. ? Limit how much you use to:  0-1 drink a day for women.  0-2 drinks a day for men. ? Be aware of how much alcohol is in your drink. In the [place] one drink equals one 12 oz bottle of beer (355 mL), one 5 oz glass of wine (148 mL), or one 1 oz glass of hard liquor (44 mL). ? Keep yourself hydrated with water, diet soda, or unsweetened iced tea.  Keep in mind that regular soda, juice, and other mixers may contain a lot of sugar and must be counted as carbs. What are tips for following this plan? Reading food labels  Start by checking the serving size on the "Nutrition Facts" label of packaged foods and drinks. The amount of calories, carbs, fats, and other nutrients listed on the label is based on one serving of the item. Many items contain more than one serving per package.  Check the total grams (g) of carbs in one serving. You can calculate the number of servings of carbs in one serving by dividing the total carbs by 15. For example, if a food has 30 g of total carbs per serving, it would be equal to 2 servings of carbs.  Check the number of grams (g) of saturated fats and trans fats in one serving. Choose foods that have   a low amount or none of these fats.  Check the number of milligrams (mg) of salt (sodium) in one serving. Most people should limit total sodium intake to less than 2,300 mg per day.  Always check the nutrition information of foods labeled as "low-fat" or "nonfat." These foods may be higher in added sugar or refined carbs and should be avoided.  Talk to your dietitian to identify your daily goals for nutrients listed on the label. Shopping  Avoid buying canned, pre-made, or processed foods. These foods tend to be high in fat, sodium, and added  sugar.  Shop around the outside edge of the grocery store. This is where you will most often find fresh fruits and vegetables, bulk grains, fresh meats, and fresh dairy. Cooking  Use low-heat cooking methods, such as baking, instead of high-heat cooking methods like deep frying.  Cook using healthy oils, such as olive, canola, or sunflower oil.  Avoid cooking with butter, cream, or high-fat meats. Meal planning  Eat meals and snacks regularly, preferably at the same times every day. Avoid going long periods of time without eating.  Eat foods that are high in fiber, such as fresh fruits, vegetables, beans, and whole grains. Talk with your dietitian about how many servings of carbs you can eat at each meal.  Eat 4-6 oz (112-168 g) of lean protein each day, such as lean meat, chicken, fish, eggs, or tofu. One ounce (oz) of lean protein is equal to: ? 1 oz (28 g) of meat, chicken, or fish. ? 1 egg. ?  cup (62 g) of tofu.  Eat some foods each day that contain healthy fats, such as avocado, nuts, seeds, and fish.   What foods should I eat? Fruits Berries. Apples. Oranges. Peaches. Apricots. Plums. Grapes. Mango. Papaya. Pomegranate. Kiwi. Cherries. Vegetables Lettuce. Spinach. Leafy greens, including kale, chard, collard greens, and mustard greens. Beets. Cauliflower. Cabbage. Broccoli. Carrots. Green beans. Tomatoes. Peppers. Onions. Cucumbers. Brussels sprouts. Grains Whole grains, such as whole-wheat or whole-grain bread, crackers, tortillas, cereal, and pasta. Unsweetened oatmeal. Quinoa. Brown or wild rice. Meats and other proteins Seafood. Poultry without skin. Lean cuts of poultry and beef. Tofu. Nuts. Seeds. Dairy Low-fat or fat-free dairy products such as milk, yogurt, and cheese. The items listed above may not be a complete list of foods and beverages you can eat. Contact a dietitian for more information. What foods should I avoid? Fruits Fruits canned with  syrup. Vegetables Canned vegetables. Frozen vegetables with butter or cream sauce. Grains Refined white flour and flour products such as bread, pasta, snack foods, and cereals. Avoid all processed foods. Meats and other proteins Fatty cuts of meat. Poultry with skin. Breaded or fried meats. Processed meat. Avoid saturated fats. Dairy Full-fat yogurt, cheese, or milk. Beverages Sweetened drinks, such as soda or iced tea. The items listed above may not be a complete list of foods and beverages you should avoid. Contact a dietitian for more information. Questions to ask a health care provider  Do I need to meet with a diabetes educator?  Do I need to meet with a dietitian?  What number can I call if I have questions?  When are the best times to check my blood glucose? Where to find more information:  American Diabetes Association: diabetes.org  Academy of Nutrition and Dietetics: www.eatright.org  National Institute of Diabetes and Digestive and Kidney Diseases: www.niddk.nih.gov  Association of Diabetes Care and Education Specialists: www.diabeteseducator.org Summary  It is important to have healthy eating   habits because your blood sugar (glucose) levels are greatly affected by what you eat and drink.  A healthy meal plan will help you control your blood glucose and maintain a healthy lifestyle.  Your health care provider may recommend that you work with a dietitian to make a meal plan that is best for you.  Keep in mind that carbohydrates (carbs) and alcohol have immediate effects on your blood glucose levels. It is important to count carbs and to use alcohol carefully. This information is not intended to replace advice given to you by your health care provider. Make sure you discuss any questions you have with your health care provider. Document Revised: 05/10/2019 Document Reviewed: 05/10/2019 Elsevier Patient Education  2021 [employer]  

## 2020-12-08 LAB — COMPREHENSIVE METABOLIC PANEL
ALT: 27 IU/L (ref 0–32)
AST: 31 IU/L (ref 0–40)
Albumin/Globulin Ratio: 1.9 (ref 1.2–2.2)
Albumin: 3.9 g/dL (ref 3.8–4.8)
Alkaline Phosphatase: 71 IU/L (ref 44–121)
BUN/Creatinine Ratio: 13 (ref 12–28)
BUN: 13 mg/dL (ref 8–27)
Bilirubin Total: 0.6 mg/dL (ref 0.0–1.2)
CO2: 25 mmol/L (ref 20–29)
Calcium: 9.5 mg/dL (ref 8.7–10.3)
Chloride: 103 mmol/L (ref 96–106)
Creatinine, Ser: 1 mg/dL (ref 0.57–1.00)
Globulin, Total: 2.1 g/dL (ref 1.5–4.5)
Glucose: 206 mg/dL — ABNORMAL HIGH (ref 65–99)
Potassium: 4.4 mmol/L (ref 3.5–5.2)
Sodium: 143 mmol/L (ref 134–144)
Total Protein: 6 g/dL (ref 6.0–8.5)
eGFR: 63 mL/min/{1.73_m2} (ref >59)

## 2020-12-08 LAB — LIPID PANEL
Chol/HDL Ratio: 2.1 ratio (ref 0.0–4.4)
Cholesterol, Total: 132 mg/dL (ref 100–199)
HDL: 63 mg/dL (ref >39)
LDL Chol Calc (NIH): 43 mg/dL (ref 0–99)
Triglycerides: 155 mg/dL — ABNORMAL HIGH (ref 0–149)
VLDL Cholesterol Cal: 26 mg/dL (ref 5–40)

## 2020-12-11 ENCOUNTER — Ambulatory Visit: Payer: Medicare Other | Admitting: Orthopaedic Surgery

## 2020-12-12 DIAGNOSIS — M25562 Pain in left knee: Secondary | ICD-10-CM | POA: Diagnosis not present

## 2020-12-13 DIAGNOSIS — E1122 Type 2 diabetes mellitus with diabetic chronic kidney disease: Secondary | ICD-10-CM | POA: Diagnosis not present

## 2020-12-13 DIAGNOSIS — I129 Hypertensive chronic kidney disease with stage 1 through stage 4 chronic kidney disease, or unspecified chronic kidney disease: Secondary | ICD-10-CM | POA: Diagnosis not present

## 2020-12-13 DIAGNOSIS — N1831 Chronic kidney disease, stage 3a: Secondary | ICD-10-CM | POA: Diagnosis not present

## 2020-12-13 DIAGNOSIS — E7849 Other hyperlipidemia: Secondary | ICD-10-CM | POA: Diagnosis not present

## 2020-12-20 ENCOUNTER — Ambulatory Visit: Payer: Medicare Other | Admitting: Orthopaedic Surgery

## 2020-12-20 ENCOUNTER — Telehealth: Payer: Self-pay | Admitting: Nurse Practitioner

## 2020-12-20 NOTE — Telephone Encounter (Signed)
Pt is calling and requesting a call back about her sugars. I asked pt to give me 3 days worth of readings, she states she has not checked it like she should since July 3 because 'she got nervous checking it'.   7/4 AM 188  7/6 AM 217  7/7 right now 384.

## 2020-12-20 NOTE — Telephone Encounter (Signed)
Spoke with pt, verbalized understanding, states she took 2 metformin this morning because of the high reading, after speaking with Whitney informed patient do not take her night time metformin as she has already taken 2 today. Pt has an appointment on Tuesday 7/12 and was advised to bring her readings with her.

## 2020-12-20 NOTE — Telephone Encounter (Signed)
I do expect a rebound in her blood glucose since we changed her medications up at the initial visit.  I am not too worried about the high glucose (especially if it is during the day when she is eating).  If taking her glucose 4 times a day is stressing her out, just have her go back to checking it at least twice daily, before breakfast and before bed.  I will at least need those readings to know how to adjust her meds at her follow up.

## 2020-12-25 ENCOUNTER — Other Ambulatory Visit: Payer: Self-pay

## 2020-12-25 ENCOUNTER — Ambulatory Visit: Payer: Medicare Other | Admitting: Nurse Practitioner

## 2020-12-25 ENCOUNTER — Encounter: Payer: Medicare Other | Attending: Internal Medicine | Admitting: Nutrition

## 2020-12-25 ENCOUNTER — Encounter: Payer: Self-pay | Admitting: Nurse Practitioner

## 2020-12-25 VITALS — BP 128/66 | HR 87 | Ht 64.0 in | Wt 208.0 lb

## 2020-12-25 DIAGNOSIS — E669 Obesity, unspecified: Secondary | ICD-10-CM | POA: Diagnosis present

## 2020-12-25 DIAGNOSIS — E782 Mixed hyperlipidemia: Secondary | ICD-10-CM

## 2020-12-25 DIAGNOSIS — E1142 Type 2 diabetes mellitus with diabetic polyneuropathy: Secondary | ICD-10-CM | POA: Insufficient documentation

## 2020-12-25 DIAGNOSIS — E119 Type 2 diabetes mellitus without complications: Secondary | ICD-10-CM

## 2020-12-25 DIAGNOSIS — I10 Essential (primary) hypertension: Secondary | ICD-10-CM | POA: Diagnosis not present

## 2020-12-25 NOTE — Progress Notes (Signed)
Medical Nutrition Therapy  Walk in From Rayetta Pigg, FNP Walk in today. Will address safety questions next visit. Appointment Start time:  50  Appointment End time:  53  Primary concerns today: Dm Type 2, Obesity  Referral diagnosis: E11.8, E66.9 Preferred learning style:  no preference indicated) Learning readiness: change in progress   NUTRITION ASSESSMENT  She is trying to get her A1C down before  having knee surgery for better healing and outcomes. Has been working on eating better foods. Lost 6 lbs since last visit with Rayetta Pigg, FNP Needs further education on CHO counting, meal planning and portion control and avoiding snacks between meals.  Anthropometrics  Wt Readings from Last 3 Encounters:  12/25/20 208 lb (94.3 kg)  12/07/20 214 lb (97.1 kg)  11/15/20 213 lb (96.6 kg)   Ht Readings from Last 3 Encounters:  12/25/20 5\' 4"  (1.626 m)  12/07/20 5\' 4"  (1.626 m)  11/15/20 5\' 4"  (1.626 m)   There is no height or weight on file to calculate BMI. @BMIFA @ Facility age limit for growth percentiles is 20 years. Facility age limit for growth percentiles is 20 years.    Clinical Medical Hx: See chart Medications: Trulicity, Glimiperide, Metformin 500 mg daily, Labs:  Lab Results  Component Value Date   HGBA1C 9 10/31/2020   CMP Latest Ref Rng & Units 12/07/2020 02/09/2020 12/03/2019  Glucose 65 - 99 mg/dL 206(H) 229(H) 219(H)  BUN 8 - 27 mg/dL 13 12 17   Creatinine 0.57 - 1.00 mg/dL 1.00 1.21(H) 1.06(H)  Sodium 134 - 144 mmol/L 143 138 136  Potassium 3.5 - 5.2 mmol/L 4.4 4.5 3.6  Chloride 96 - 106 mmol/L 103 103 101  CO2 20 - 29 mmol/L 25 28 25   Calcium 8.7 - 10.3 mg/dL 9.5 10.0 8.7(L)  Total Protein 6.0 - 8.5 g/dL 6.0 6.5 6.9  Total Bilirubin 0.0 - 1.2 mg/dL 0.6 0.8 1.0  Alkaline Phos 44 - 121 IU/L 71 69 53  AST 0 - 40 IU/L 31 29 45(H)  ALT 0 - 32 IU/L 27 28 47(H)   lifestyle & Dietary Hx She lives with her husband.   Estimated daily fluid intake: 64  oz Supplements:  Sleep: doesn't sleep well at night due to knee pain Stress / self-care: upcoming surgery and medical issues  Current average weekly physical activity: ADL   Estimated Energy Needs Calories: 1200 Carbohydrate: 135g Protein: 90g Fat: 44g   NUTRITION DIAGNOSIS  NB-1.1 Food and nutrition-related knowledge deficit As related to Diabetes Type 2.  As evidenced by A1C 9%.   NUTRITION INTERVENTION  Nutrition education (E-1) on the following topics:  Nutrition and Diabetes education provided on My Plate, CHO counting, meal planning, portion sizes, timing of meals, avoiding snacks between meals unless having a low blood sugar, target ranges for A1C and blood sugars, signs/symptoms and treatment of hyper/hypoglycemia, monitoring blood sugars, taking medications as prescribed, benefits of exercising 30 minutes per day and prevention of complications of DM.   Handouts Provided Include  My Plate Meal Plan Card Diabetes instructions.   Learning Style & Readiness for Change Teaching method utilized: Visual & Auditory  Demonstrated degree of understanding via: Teach Back  Barriers to learning/adherence to lifestyle change: knee issues for exercise  Goals Established by Pt Follow My Plate Eat meals on time Increase lower carb vegetables and fruits Cut out snacks unless greek yogurt or vegetables. Drink only water Increase protein with meals and fiber rich foods Avoid processed and high fat foods/snacks. Get  FBS less than 130 and BS less than 150 at bedtime. Focus on more nutrient dense foods and cut down on calorie dense foods.  MONITORING & EVALUATION Dietary intake, weekly physical activity, and blood sugars in 1 month.  Next Steps  Patient is to work on menu planning and better balanced meals.Marland Kitchen

## 2020-12-25 NOTE — Progress Notes (Signed)
Endocrinology Follow Up Note       12/25/2020, 1:15 PM   Subjective:    Patient ID: [name], female    DOB: July 10, 1955.  [name] is being seen in follow up after being seen in consultation for management of currently uncontrolled symptomatic diabetes requested by  Laurie School, MD.   Past Medical History:  Diagnosis Date   Anxiety    Asthma    Cancer [facility])    Left breast   Complication of anesthesia    pt states she is hard to wake up after anesthetic-"she was told this at [facility]."    Constipation    Constipation    DDD (degenerative disc disease), lumbar    Depression    situational   Diabetes mellitus    Type II   Dyspnea    Family history of adverse reaction to anesthesia    Brother- N/V   Fibromyalgia    Gait instability    GERD (gastroesophageal reflux disease)    Headache    History of blood transfusion    with childbirth   History of radiation therapy 12/01/16-01/08/17   left breast 50.4 Gy in 28 fractions   Hypertension    Pneumonia 2008   PONV (postoperative nausea and vomiting)    Skin cancer    Stroke [facility])    mini stroke 2008    Past Surgical History:  Procedure Laterality Date   ABDOMINAL HYSTERECTOMY     Cervical- cancer cell   ANTERIOR CERVICAL DISCECTOMY  2007   BACK SURGERY  2009   Laminectomy   Back surgery 4 yrs ago  2008   BALLOON DILATION  10/02/2011   Procedure: BALLOON DILATION;  Surgeon: [name], MD;  [facility];  Service: Endoscopy;  Laterality: N/A;   BREAST LUMPECTOMY WITH RADIOACTIVE SEED AND SENTINEL LYMPH NODE BIOPSY Left 10/13/2016   Procedure: BREAST LUMPECTOMY WITH RADIOACTIVE SEED AND SENTINEL LYMPH NODE BIOPSY;  Surgeon: [name], MD;  Location: [facility];  Service: General;  Laterality: Left;   CHOLECYSTECTOMY     COLONOSCOPY WITH PROPOFOL N/A 04/18/2016   Procedure: COLONOSCOPY WITH PROPOFOL;   Surgeon: [name], MD;  [facility];  Service: Endoscopy;  Laterality: N/A;  955   FOOT SURGERY     tried to remove bone spur- was unable- "just clean"   MALONEY DILATION  10/02/2011   Procedure: MALONEY DILATION;  Surgeon: [name], MD;  [facility];  Service: Endoscopy;  Laterality: N/A;   SAVORY DILATION  10/02/2011   Procedure: SAVORY DILATION;  Surgeon: [name], MD;  [facility];  Service: Endoscopy;  Laterality: N/A;    Social History   Socioeconomic History   Marital status: Married    Spouse name: Not on file   Number of children: 2   Years of education: 12   Highest education level: [employer] graduate  Occupational History   Occupation: disabled  Tobacco Use   Smoking status: Never   Smokeless tobacco: Never  Vaping Use   Vaping Use: Never used  Substance and Sexual Activity   Alcohol use: Yes  Alcohol/week: 0.0 standard drinks    Comment: glass of wine- occasional   Drug use: Never   Sexual activity: Not on file  Other Topics Concern   Not on file  Social History Narrative   Lives at home with husband.   Left-handed.   10oz caffeine per day.   Social Determinants of Health   Financial Resource Strain: Not on file  Food Insecurity: Not on file  Transportation Needs: Not on file  Physical Activity: Not on file  Stress: Not on file  Social Connections: Not on file    Family History  Problem Relation Age of Onset   Heart attack Mother    Lung cancer Father     Outpatient Encounter Medications as of 12/25/2020  Medication Sig   ALPRAZolam (XANAX) 1 MG tablet Take 1 mg by mouth 4 (four) times daily as needed. Reports taking 0.5 tab QHS.   atenolol (TENORMIN) 25 MG tablet Take 25 mg by mouth daily.    atorvastatin (LIPITOR) 20 MG tablet Take 20 mg by mouth daily.   dicyclomine (BENTYL) 20 MG tablet Take 1 every 8-12 hours as needed for abdominal cramping   Dulaglutide (TRULICITY) 1.5 PF/2.9WK SOPN  Inject 3 mg into the skin once a week.   gabapentin (NEURONTIN) 300 MG capsule Take 300 mg by mouth 3 (three) times daily as needed.   glimepiride (AMARYL) 4 MG tablet Take 2 mg by mouth 2 (two) times daily.   lidocaine (LIDODERM) 5 % Place 1 patch onto the skin daily as needed (pain). Remove & Discard patch within 12 hours or as directed by MD    losartan (COZAAR) 25 MG tablet Take 25 mg by mouth daily.   metFORMIN (GLUCOPHAGE) 1000 MG tablet Take 500 mg by mouth daily.   Oxycodone HCl 10 MG TABS Take 10 mg by mouth every 6 (six) hours as needed (pain).    pantoprazole (PROTONIX) 40 MG tablet Take 40 mg by mouth daily.   tamoxifen (NOLVADEX) 20 MG tablet TAKE ONE TABLET BY MOUTH EVERY MORNING (Patient taking differently: 20 mg daily.)   venlafaxine XR (EFFEXOR-XR) 37.5 MG 24 hr capsule TAKE ONE TABLET BY MOUTH EVERY MORNING WITH BREAKFAST   [DISCONTINUED] furosemide (LASIX) 20 MG tablet Take 20 mg by mouth as needed for edema.   [DISCONTINUED] zolpidem (AMBIEN) 10 MG tablet Take 10 mg by mouth at bedtime as needed. (Patient not taking: Reported on 12/07/2020)   No facility-administered encounter medications on file as of 12/25/2020.    ALLERGIES: Allergies  Allergen Reactions   Adhesive [Tape] Other (See Comments)    Tears skin   Elemental Sulfur Other (See Comments)    Turns very red   Lyrica [Pregabalin] Swelling   Amoxicillin-Pot Clavulanate Nausea And Vomiting   Codeine Itching   Penicillins Other (See Comments)    Upset stomach   Sulfonamide Derivatives Other (See Comments)    Burning sensation    VACCINATION STATUS: Immunization History  Administered Date(s) Administered   Moderna Sars-Covid-2 Vaccination 08/27/2019, 09/28/2019    Diabetes She presents for her follow-up diabetic visit. She has type 2 diabetes mellitus. Onset time: diagnosed at approx age of 30. Her disease course has been improving. There are no hypoglycemic associated symptoms. Associated symptoms include  weight loss. Pertinent negatives for diabetes include no fatigue and no polyuria. There are no hypoglycemic complications. Symptoms are stable. Diabetic complications include a CVA. Risk factors for coronary artery disease include diabetes mellitus, dyslipidemia, family history, hypertension, obesity, post-menopausal, sedentary lifestyle  and stress. Current diabetic treatment includes oral agent (triple therapy) (Currently on Metformin 974 BId, Trulicity 3 mg; Glimepiride 4 mg BID, Acarbose 50 BID). She is compliant with treatment most of the time. Her weight is decreasing steadily. She is following a generally healthy diet. When asked about meal planning, she reported none. She has not had a previous visit with a dietitian. She rarely participates in exercise. Her breakfast blood glucose range is generally 140-180 mg/dl. Her lunch blood glucose range is generally 180-200 mg/dl. Her dinner blood glucose range is generally 180-200 mg/dl. Her bedtime blood glucose range is generally 130-140 mg/dl. (She presents today with her meter and logs showing improved glycemic profile overall.  She is still struggling with her diet, saying she is eating what she should for meals, but finds herself reaching for snacks in between due to hunger.  She did call in between visits for concerns with high blood glucose and said testing 4 times a day was stressing her out.  She was then told to monitor once daily.  She has just recently started her increased dose of Trulicity 3 mg SQ weekly.) An ACE inhibitor/angiotensin II receptor blocker is being taken. She does not see a podiatrist.Eye exam is current.  Hypertension This is a chronic problem. The current episode started more than 1 year ago. The problem has been resolved since onset. The problem is controlled. There are no associated agents to hypertension. Risk factors for coronary artery disease include diabetes mellitus, dyslipidemia, family history, obesity, post-menopausal  state, sedentary lifestyle and stress. Past treatments include angiotensin blockers, diuretics and beta blockers. The current treatment provides significant improvement. There are no compliance problems.  Hypertensive end-organ damage includes CVA. Identifiable causes of hypertension include chronic renal disease.  Hyperlipidemia This is a chronic problem. The current episode started more than 1 year ago. The problem is uncontrolled. Recent lipid tests were reviewed and are variable. Exacerbating diseases include chronic renal disease, diabetes and obesity. Factors aggravating her hyperlipidemia include beta blockers and fatty foods. Current antihyperlipidemic treatment includes statins. Compliance problems include adherence to diet and adherence to exercise.  Risk factors for coronary artery disease include diabetes mellitus, dyslipidemia, family history, obesity, hypertension, post-menopausal, a sedentary lifestyle and stress.    Review of systems  Constitutional: + steadily decreasing body weight- intentional,  current Body mass index is 35.7 kg/m. , no fatigue, no subjective hyperthermia, no subjective hypothermia Eyes: no blurry vision, no xerophthalmia ENT: no sore throat, no nodules palpated in throat, no dysphagia/odynophagia, no hoarseness Cardiovascular: no chest pain, no shortness of breath, no palpitations, no leg swelling Respiratory: no cough, no shortness of breath Gastrointestinal: no nausea/vomiting/diarrhea Musculoskeletal: right knee pain- needs replacement surgery soon Skin: no rashes, no hyperemia Neurological: no tremors, no numbness, no tingling, no dizziness Psychiatric: no depression, + anxiety  Objective:     BP 128/66   Pulse 87   Ht 5\' 4"  (1.626 m)   Wt 208 lb (94.3 kg)   BMI 35.70 kg/m   Wt Readings from Last 3 Encounters:  12/25/20 208 lb (94.3 kg)  12/07/20 214 lb (97.1 kg)  11/15/20 213 lb (96.6 kg)     BP Readings from Last 3 Encounters:  12/25/20  128/66  12/07/20 120/79  11/15/20 124/83     Physical Exam- Limited  Constitutional:  Body mass index is 35.7 kg/m. , not in acute distress, normal state of mind Eyes:  EOMI, no exophthalmos Neck: Supple Cardiovascular: RRR, no murmurs, rubs, or gallops Respiratory:  Adequate breathing efforts, no crackles, rales, rhonchi, or wheezing Musculoskeletal: no gross deformities, strength intact in all four extremities, no gross restriction of joint movements Skin:  no rashes, no hyperemia Neurological: no tremor with outstretched hands    POCT ABI Results 12/25/20   Right ABI:  1.30      Left ABI:  1.29  Right leg systolic / diastolic: 161/09 mmHg Left leg systolic / diastolic: 604/54 mmHg  Arm systolic / diastolic: 098/11 mmHG  Detailed report will be scanned into patient chart.  CMP ( most recent) CMP     Component Value Date/Time   NA 143 12/07/2020 0917   NA 140 05/05/2017 1431   K 4.4 12/07/2020 0917   K 4.4 05/05/2017 1431   CL 103 12/07/2020 0917   CO2 25 12/07/2020 0917   CO2 26 05/05/2017 1431   GLUCOSE 206 (H) 12/07/2020 0917   GLUCOSE 229 (H) 02/09/2020 0843   GLUCOSE 194 (H) 05/05/2017 1431   BUN 13 12/07/2020 0917   BUN 15.0 05/05/2017 1431   CREATININE 1.00 12/07/2020 0917   CREATININE 1.21 (H) 02/09/2020 0843   CREATININE 1.2 (H) 05/05/2017 1431   CALCIUM 9.5 12/07/2020 0917   CALCIUM 9.7 05/05/2017 1431   PROT 6.0 12/07/2020 0917   PROT 7.3 05/05/2017 1431   ALBUMIN 3.9 12/07/2020 0917   ALBUMIN 3.8 05/05/2017 1431   AST 31 12/07/2020 0917   AST 29 02/09/2020 0843   AST 39 (H) 05/05/2017 1431   ALT 27 12/07/2020 0917   ALT 28 02/09/2020 0843   ALT 51 05/05/2017 1431   ALKPHOS 71 12/07/2020 0917   ALKPHOS 74 05/05/2017 1431   BILITOT 0.6 12/07/2020 0917   BILITOT 0.8 02/09/2020 0843   BILITOT 0.77 05/05/2017 1431   GFRNONAA 48 (L) 02/09/2020 0843   GFRAA 56 (L) 02/09/2020 0843     Diabetic Labs (most recent): Lab Results  Component  Value Date   HGBA1C 9 10/31/2020     Lipid Panel ( most recent) Lipid Panel     Component Value Date/Time   CHOL 132 12/07/2020 0917   TRIG 155 (H) 12/07/2020 0917   HDL 63 12/07/2020 0917   CHOLHDL 2.1 12/07/2020 0917   LDLCALC 43 12/07/2020 0917   LABVLDL 26 12/07/2020 0917      Lab Results  Component Value Date   TSH 0.952 09/12/2019           Assessment & Plan:   1) Type 2 diabetes mellitus without complication, without long-term current use of insulin ([place])  She presents today with her meter and logs showing improved glycemic profile overall.  She is still struggling with her diet, saying she is eating what she should for meals, but finds herself reaching for snacks in between due to hunger.  She did call in between visits for concerns with high blood glucose and said testing 4 times a day was stressing her out.  She was then told to monitor once daily.  She has just recently started her increased dose of Trulicity 3 mg SQ weekly.  - Kathrina W Mcnee has currently uncontrolled symptomatic type 2 DM since 65 years of age.  -Recent labs reviewed.  - I had a long discussion with her about the progressive nature of diabetes and the pathology behind its complications. -her diabetes is complicated by CVA and she remains at a high risk for more acute and chronic complications which include CAD, CVA, CKD, retinopathy, and neuropathy. These are all discussed in detail  with her.  - Nutritional counseling repeated at each appointment due to patients tendency to fall back in to old habits.  - The patient admits there is a room for improvement in their diet and drink choices. -  Suggestion is made for the patient to avoid simple carbohydrates from their diet including Cakes, Sweet Desserts / Pastries, Ice Cream, Soda (diet and regular), Sweet Tea, Candies, Chips, Cookies, Sweet Pastries, Store Bought Juices, Alcohol in Excess of 1-2 drinks a day, Artificial Sweeteners, Coffee  Creamer, and "Sugar-free" Products. This will help patient to have stable blood glucose profile and potentially avoid unintended weight gain.   - I encouraged the patient to switch to unprocessed or minimally processed complex starch and increased protein intake (animal or plant source), fruits, and vegetables.   - Patient is advised to stick to a routine mealtimes to eat 3 meals a day and avoid unnecessary snacks (to snack only to correct hypoglycemia).  -I am having her sit with Jearld Fenton, RDE after her visit with me today to discuss meal planning and how to avoid snacks.  - I have approached her with the following individualized plan to manage her diabetes and patient agrees:   -She is advised to continue Metformin 500 mg ER daily with supper, continue Glimepiride 2 mg po twice daily with meals and to continue her Trulicity 3 mg SQ weekly (just started recently and tolerating well).  May increase to 4.5 mg dose at next visit depending on toleration.  -she is encouraged to continue monitoring blood glucose twice daily, before breakfast and before bed, and to call the clinic if she has readings less than 70 or greater than 300 for 3 tests in a row.   - Adjustment parameters are given to her for hypo and hyperglycemia in writing.  - Specific targets for  A1c; LDL, HDL, and Triglycerides were discussed with the patient.  2) Blood Pressure /Hypertension:  her blood pressure is controlled to target.   she is advised to continue her current medications including Atenolol 25 mg po daily, Lasix 20 mg as needed for edema, and Losartan 25 mg po daily.    3) Lipids/Hyperlipidemia:    Her most recent lipid panel from 12/07/20 showed controlled LDL of 43 and slightly elevated triglycerides of 155.  she is advised to continue Lipitor 20 mg daily at bedtime.  Side effects and precautions discussed with her.    4)  Weight/Diet:  her Body mass index is 35.7 kg/m.  -  clearly complicating her diabetes  care.   she is a candidate for weight loss. I discussed with her the fact that loss of 5 - 10% of her  current body weight will have the most impact on her diabetes management.  Exercise, and detailed carbohydrates information provided  -  detailed on discharge instructions.  5) Chronic Care/Health Maintenance: -she is on ACEI/ARB and Statin medications and is encouraged to initiate and continue to follow up with Ophthalmology, Dentist, Podiatrist at least yearly or according to recommendations, and advised to stay away from smoking. I have recommended yearly flu vaccine and pneumonia vaccine at least every 5 years; moderate intensity exercise for up to 150 minutes weekly; and sleep for at least 7 hours a day.  - she is advised to maintain close follow up with Laurie School, MD for primary care needs, as well as her other providers for optimal and coordinated care.     I spent 35 minutes in the care of the  patient today including review of labs from CMP, Lipids, Thyroid Function, Hematology (current and previous including abstractions from other facilities); face-to-face time discussing  her blood glucose readings/logs, discussing hypoglycemia and hyperglycemia episodes and symptoms, medications doses, her options of short and long term treatment based on the latest standards of care / guidelines;  discussion about incorporating lifestyle medicine;  and documenting the encounter.    Please refer to Patient Instructions for Blood Glucose Monitoring and Insulin/Medications Dosing Guide"  in media tab for additional information. Please  also refer to " Patient Self Inventory" in the Media  tab for reviewed elements of pertinent patient history.  [name] participated in the discussions, expressed understanding, and voiced agreement with the above plans.  All questions were answered to her satisfaction. she is encouraged to contact clinic should she have any questions or concerns prior to her  return visit.     Follow up plan: - Return in about 1 month (around 01/25/2021) for Bring meter and logs, Diabetes F/U with A1c in office.    Rayetta Pigg, [facility] [address] [place], [place] [address] Phone: [phone] Fax: [phone]  12/25/2020, 1:15 PM

## 2020-12-25 NOTE — Patient Instructions (Addendum)
Follow My Plate Eat meals on time Increase lower carb vegetables and fruits Cut out snacks unless greek yogurt or vegetables. Drink only water Increase protein with meals and fiber rich foods Avoid processed and high fat foods/snacks. Get FBS less than 130 and BS less than 150 at bedtime. Increase nutrient dense foods and cut down on calorie dense foods.

## 2020-12-25 NOTE — Patient Instructions (Signed)

## 2021-01-13 DIAGNOSIS — E782 Mixed hyperlipidemia: Secondary | ICD-10-CM | POA: Diagnosis not present

## 2021-01-13 DIAGNOSIS — I129 Hypertensive chronic kidney disease with stage 1 through stage 4 chronic kidney disease, or unspecified chronic kidney disease: Secondary | ICD-10-CM | POA: Diagnosis not present

## 2021-01-13 DIAGNOSIS — E1122 Type 2 diabetes mellitus with diabetic chronic kidney disease: Secondary | ICD-10-CM | POA: Diagnosis not present

## 2021-01-13 DIAGNOSIS — N1831 Chronic kidney disease, stage 3a: Secondary | ICD-10-CM | POA: Diagnosis not present

## 2021-01-14 ENCOUNTER — Telehealth: Payer: Self-pay | Admitting: Oncology

## 2021-01-14 NOTE — Telephone Encounter (Signed)
Scheduled appointment per provider. Left message. 

## 2021-01-17 ENCOUNTER — Other Ambulatory Visit (HOSPITAL_COMMUNITY): Payer: Self-pay | Admitting: Physician Assistant

## 2021-01-17 DIAGNOSIS — Z9889 Other specified postprocedural states: Secondary | ICD-10-CM

## 2021-01-31 DIAGNOSIS — G4709 Other insomnia: Secondary | ICD-10-CM | POA: Diagnosis not present

## 2021-01-31 DIAGNOSIS — M1991 Primary osteoarthritis, unspecified site: Secondary | ICD-10-CM | POA: Diagnosis not present

## 2021-01-31 DIAGNOSIS — M5416 Radiculopathy, lumbar region: Secondary | ICD-10-CM | POA: Diagnosis not present

## 2021-01-31 DIAGNOSIS — G894 Chronic pain syndrome: Secondary | ICD-10-CM | POA: Diagnosis not present

## 2021-01-31 NOTE — Patient Instructions (Signed)
Advice for Weight Management  -For most of us the best way to lose weight is by diet management. Generally speaking, diet management means consuming less calories intentionally which over time brings about progressive weight loss.  This can be achieved more effectively by restricting carbohydrate consumption to the minimum possible.  So, it is critically important to know your numbers: how much calorie you are consuming and how much calorie you need. More importantly, our carbohydrates sources should be unprocessed or minimally processed complex starch food items.   Sometimes, it is important to balance nutrition by increasing protein intake (animal or plant source), fruits, and vegetables.  -Sticking to a routine mealtime to eat 3 meals a day and avoiding unnecessary snacks is shown to have a big role in weight control. Under normal circumstances, the only time we lose real weight is when we are hungry, so allow hunger to take place- hunger means no food between meal times, only water.  It is not advisable to starve.   -It is better to avoid simple carbohydrates including: Cakes, Sweet Desserts, Ice Cream, Soda (diet and regular), Sweet Tea, Candies, Chips, Cookies, Store Bought Juices, Alcohol in Excess of  1-2 drinks a day, Artificial Sweeteners, Doughnuts, Coffee Creamers, "Sugar-free" Products, etc, etc.  This is not a complete list.....    -Consulting with certified diabetes educators is proven to provide you with the most accurate and current information on diet.  Also, you may be  interested in discussing diet options/exchanges , we can schedule a visit with [name], RDN, CDE for individualized nutrition education.  -Exercise: If you are able: 30 -60 minutes a day ,4 days a week, or 150 minutes a week.  The longer the better.  Combine stretch, strength, and aerobic activities.  If you were told in the past that you have high risk for cardiovascular diseases, you may seek evaluation by  your heart doctor prior to initiating moderate to intense exercise programs.    

## 2021-02-01 ENCOUNTER — Encounter: Payer: Self-pay | Admitting: Nurse Practitioner

## 2021-02-01 ENCOUNTER — Ambulatory Visit: Payer: Medicare Other | Admitting: Nurse Practitioner

## 2021-02-01 ENCOUNTER — Other Ambulatory Visit: Payer: Self-pay

## 2021-02-01 VITALS — BP 118/70 | HR 100 | Ht 64.0 in | Wt 208.0 lb

## 2021-02-01 DIAGNOSIS — E782 Mixed hyperlipidemia: Secondary | ICD-10-CM

## 2021-02-01 DIAGNOSIS — E119 Type 2 diabetes mellitus without complications: Secondary | ICD-10-CM | POA: Diagnosis not present

## 2021-02-01 DIAGNOSIS — I10 Essential (primary) hypertension: Secondary | ICD-10-CM | POA: Diagnosis not present

## 2021-02-01 DIAGNOSIS — E1165 Type 2 diabetes mellitus with hyperglycemia: Secondary | ICD-10-CM | POA: Diagnosis not present

## 2021-02-01 LAB — POCT GLYCOSYLATED HEMOGLOBIN (HGB A1C): Hemoglobin A1C: 8.1 % — AB (ref 4.0–5.6)

## 2021-02-01 MED ORDER — TRULICITY 4.5 MG/0.5ML ~~LOC~~ SOAJ: 4.5000 mg | SUBCUTANEOUS | 3 refills | Status: DC

## 2021-02-01 NOTE — Progress Notes (Signed)
Endocrinology Follow Up Note       02/01/2021, 10:18 AM   Subjective:    Patient ID: [name], female    DOB: 12/12/1955.  [name] is being seen in follow up after being seen in consultation for management of currently uncontrolled symptomatic diabetes requested by  Redmond School, MD.   Past Medical History:  Diagnosis Date   Anxiety    Asthma    Cancer [facility])    Left breast   Complication of anesthesia    pt states she is hard to wake up after anesthetic-"she was told this at [facility]."    Constipation    Constipation    DDD (degenerative disc disease), lumbar    Depression    situational   Diabetes mellitus    Type II   Dyspnea    Family history of adverse reaction to anesthesia    Brother- N/V   Fibromyalgia    Gait instability    GERD (gastroesophageal reflux disease)    Headache    History of blood transfusion    with childbirth   History of radiation therapy 12/01/16-01/08/17   left breast 50.4 Gy in 28 fractions   Hypertension    Pneumonia 2008   PONV (postoperative nausea and vomiting)    Skin cancer    Stroke [facility])    mini stroke 2008    Past Surgical History:  Procedure Laterality Date   ABDOMINAL HYSTERECTOMY     Cervical- cancer cell   ANTERIOR CERVICAL DISCECTOMY  2007   BACK SURGERY  2009   Laminectomy   Back surgery 4 yrs ago  2008   BALLOON DILATION  10/02/2011   Procedure: BALLOON DILATION;  Surgeon: [name], MD;  [facility];  Service: Endoscopy;  Laterality: N/A;   BREAST LUMPECTOMY WITH RADIOACTIVE SEED AND SENTINEL LYMPH NODE BIOPSY Left 10/13/2016   Procedure: BREAST LUMPECTOMY WITH RADIOACTIVE SEED AND SENTINEL LYMPH NODE BIOPSY;  Surgeon: [name], MD;  Location: [place];  Service: General;  Laterality: Left;   CHOLECYSTECTOMY     COLONOSCOPY WITH PROPOFOL N/A 04/18/2016   Procedure: COLONOSCOPY WITH PROPOFOL;   Surgeon: [name], MD;  [facility];  Service: Endoscopy;  Laterality: N/A;  955   FOOT SURGERY     tried to remove bone spur- was unable- "just clean"   MALONEY DILATION  10/02/2011   Procedure: MALONEY DILATION;  Surgeon: [name], MD;  [facility];  Service: Endoscopy;  Laterality: N/A;   SAVORY DILATION  10/02/2011   Procedure: SAVORY DILATION;  Surgeon: [name], MD;  [facility];  Service: Endoscopy;  Laterality: N/A;    Social History   Socioeconomic History   Marital status: Married    Spouse name: Not on file   Number of children: 2   Years of education: 12   Highest education level: High school graduate  Occupational History   Occupation: disabled  Tobacco Use   Smoking status: Never   Smokeless tobacco: Never  Vaping Use   Vaping Use: Never used  Substance and Sexual Activity   Alcohol use: Yes  Alcohol/week: 0.0 standard drinks    Comment: glass of wine- occasional   Drug use: Never   Sexual activity: Not on file  Other Topics Concern   Not on file  Social History Narrative   Lives at home with husband.   Left-handed.   10oz caffeine per day.   Social Determinants of Health   Financial Resource Strain: Not on file  Food Insecurity: Not on file  Transportation Needs: Not on file  Physical Activity: Not on file  Stress: Not on file  Social Connections: Not on file    Family History  Problem Relation Age of Onset   Heart attack Mother    Lung cancer Father     Outpatient Encounter Medications as of 02/01/2021  Medication Sig   Dulaglutide (TRULICITY) 4.5 0000000 SOPN Inject 4.5 mg as directed once a week.   ALPRAZolam (XANAX) 1 MG tablet Take 1 mg by mouth 4 (four) times daily as needed. Reports taking 0.5 tab QHS.   atenolol (TENORMIN) 25 MG tablet Take 25 mg by mouth daily.    atorvastatin (LIPITOR) 20 MG tablet Take 20 mg by mouth daily.   dicyclomine (BENTYL) 20 MG tablet Take 1 every 8-12  hours as needed for abdominal cramping   gabapentin (NEURONTIN) 300 MG capsule Take 300 mg by mouth 3 (three) times daily as needed.   glimepiride (AMARYL) 4 MG tablet Take 2 mg by mouth 2 (two) times daily.   lidocaine (LIDODERM) 5 % Place 1 patch onto the skin daily as needed (pain). Remove & Discard patch within 12 hours or as directed by MD    losartan (COZAAR) 25 MG tablet Take 25 mg by mouth daily.   metFORMIN (GLUCOPHAGE) 500 MG tablet Take 500 mg by mouth 2 (two) times daily with a meal.   Oxycodone HCl 10 MG TABS Take 10 mg by mouth every 6 (six) hours as needed (pain).    pantoprazole (PROTONIX) 40 MG tablet Take 40 mg by mouth daily.   tamoxifen (NOLVADEX) 20 MG tablet TAKE ONE TABLET BY MOUTH EVERY MORNING (Patient taking differently: 20 mg daily.)   venlafaxine XR (EFFEXOR-XR) 37.5 MG 24 hr capsule TAKE ONE TABLET BY MOUTH EVERY MORNING WITH BREAKFAST   [DISCONTINUED] Dulaglutide (TRULICITY) 1.5 0000000 SOPN Inject 3 mg into the skin once a week.   No facility-administered encounter medications on file as of 02/01/2021.    ALLERGIES: Allergies  Allergen Reactions   Adhesive [Tape] Other (See Comments)    Tears skin   Elemental Sulfur Other (See Comments)    Turns very red   Lyrica [Pregabalin] Swelling   Amoxicillin-Pot Clavulanate Nausea And Vomiting   Codeine Itching   Penicillins Other (See Comments)    Upset stomach   Sulfonamide Derivatives Other (See Comments)    Burning sensation    VACCINATION STATUS: Immunization History  Administered Date(s) Administered   Moderna Sars-Covid-2 Vaccination 08/27/2019, 09/28/2019    Diabetes She presents for her follow-up diabetic visit. She has type 2 diabetes mellitus. Onset time: diagnosed at approx age of 54. Her disease course has been improving. There are no hypoglycemic associated symptoms. Pertinent negatives for diabetes include no fatigue, no polyuria and no weight loss. There are no hypoglycemic complications.  Symptoms are stable. Diabetic complications include a CVA. Risk factors for coronary artery disease include diabetes mellitus, dyslipidemia, family history, hypertension, obesity, post-menopausal, sedentary lifestyle and stress. Current diabetic treatment includes oral agent (dual therapy) (and trulicity). She is compliant with treatment most of the  time. Her weight is fluctuating minimally. She is following a generally healthy diet. When asked about meal planning, she reported none. She has not had a previous visit with a dietitian. She rarely participates in exercise. Her home blood glucose trend is decreasing steadily. Her breakfast blood glucose range is generally 140-180 mg/dl. Her bedtime blood glucose range is generally 140-180 mg/dl. (She presents today with her logs, no meter, showing improved, yet still above target fasting and postprandial glycemic profile.  Her POCT A1c today is 8.1%, improving from previous visit of 9%.  She says her glucose would have been better, but she went on vacation to the beach and consumed foods she knows weren't the healthiest.  She is also planning on having surgery on her right knee soon.) An ACE inhibitor/angiotensin II receptor blocker is being taken. She does not see a podiatrist.Eye exam is current.  Hypertension This is a chronic problem. The current episode started more than 1 year ago. The problem has been resolved since onset. The problem is controlled. There are no associated agents to hypertension. Risk factors for coronary artery disease include diabetes mellitus, dyslipidemia, family history, obesity, post-menopausal state, sedentary lifestyle and stress. Past treatments include angiotensin blockers, diuretics and beta blockers. The current treatment provides significant improvement. There are no compliance problems.  Hypertensive end-organ damage includes CVA. Identifiable causes of hypertension include chronic renal disease.  Hyperlipidemia This is a  chronic problem. The current episode started more than 1 year ago. The problem is uncontrolled. Recent lipid tests were reviewed and are variable. Exacerbating diseases include chronic renal disease, diabetes and obesity. Factors aggravating her hyperlipidemia include beta blockers and fatty foods. Current antihyperlipidemic treatment includes statins. The current treatment provides mild improvement of lipids. Compliance problems include adherence to diet and adherence to exercise.  Risk factors for coronary artery disease include diabetes mellitus, dyslipidemia, family history, obesity, hypertension, post-menopausal, a sedentary lifestyle and stress.    Review of systems  Constitutional: + steadily decreasing body weight- intentional,  current Body mass index is 35.7 kg/m. , no fatigue, no subjective hyperthermia, no subjective hypothermia Eyes: no blurry vision, no xerophthalmia ENT: no sore throat, no nodules palpated in throat, no dysphagia/odynophagia, no hoarseness Cardiovascular: no chest pain, no shortness of breath, no palpitations, no leg swelling Respiratory: no cough, no shortness of breath Gastrointestinal: no nausea/vomiting/diarrhea Musculoskeletal: right knee pain- needs replacement surgery soon Skin: no rashes, no hyperemia, c/o itchiness in between toes on left foot Neurological: no tremors, no numbness, no tingling, no dizziness Psychiatric: no depression, + anxiety  Objective:     BP 118/70   Pulse 100   Ht '5\' 4"'$  (1.626 m)   Wt 208 lb (94.3 kg)   BMI 35.70 kg/m   Wt Readings from Last 3 Encounters:  02/01/21 208 lb (94.3 kg)  12/25/20 208 lb (94.3 kg)  12/07/20 214 lb (97.1 kg)     BP Readings from Last 3 Encounters:  02/01/21 118/70  12/25/20 128/66  12/07/20 120/79      Physical Exam- Limited  Constitutional:  Body mass index is 35.7 kg/m. , not in acute distress, normal state of mind Eyes:  EOMI, no exophthalmos Neck: Supple Cardiovascular: RRR, no  murmurs, rubs, or gallops, no edema Respiratory: Adequate breathing efforts, no crackles, rales, rhonchi, or wheezing Musculoskeletal: no gross deformities, strength intact in all four extremities, no gross restriction of joint movements, gait problem r/t knee pain Skin:  no rashes, no hyperemia, ? Fungal infection between toes on Left  foot (moisture related) Neurological: no tremor with outstretched hands    CMP ( most recent) CMP     Component Value Date/Time   NA 143 12/07/2020 0917   NA 140 05/05/2017 1431   K 4.4 12/07/2020 0917   K 4.4 05/05/2017 1431   CL 103 12/07/2020 0917   CO2 25 12/07/2020 0917   CO2 26 05/05/2017 1431   GLUCOSE 206 (H) 12/07/2020 0917   GLUCOSE 229 (H) 02/09/2020 0843   GLUCOSE 194 (H) 05/05/2017 1431   BUN 13 12/07/2020 0917   BUN 15.0 05/05/2017 1431   CREATININE 1.00 12/07/2020 0917   CREATININE 1.21 (H) 02/09/2020 0843   CREATININE 1.2 (H) 05/05/2017 1431   CALCIUM 9.5 12/07/2020 0917   CALCIUM 9.7 05/05/2017 1431   PROT 6.0 12/07/2020 0917   PROT 7.3 05/05/2017 1431   ALBUMIN 3.9 12/07/2020 0917   ALBUMIN 3.8 05/05/2017 1431   AST 31 12/07/2020 0917   AST 29 02/09/2020 0843   AST 39 (H) 05/05/2017 1431   ALT 27 12/07/2020 0917   ALT 28 02/09/2020 0843   ALT 51 05/05/2017 1431   ALKPHOS 71 12/07/2020 0917   ALKPHOS 74 05/05/2017 1431   BILITOT 0.6 12/07/2020 0917   BILITOT 0.8 02/09/2020 0843   BILITOT 0.77 05/05/2017 1431   GFRNONAA 48 (L) 02/09/2020 0843   GFRAA 56 (L) 02/09/2020 0843     Diabetic Labs (most recent): Lab Results  Component Value Date   HGBA1C 8.1 (A) 02/01/2021   HGBA1C 9 10/31/2020     Lipid Panel ( most recent) Lipid Panel     Component Value Date/Time   CHOL 132 12/07/2020 0917   TRIG 155 (H) 12/07/2020 0917   HDL 63 12/07/2020 0917   CHOLHDL 2.1 12/07/2020 0917   LDLCALC 43 12/07/2020 0917   LABVLDL 26 12/07/2020 0917      Lab Results  Component Value Date   TSH 0.952 09/12/2019            Assessment & Plan:   1) Type 2 diabetes mellitus without complication, without long-term current use of insulin ([place])  She presents today with her logs, no meter, showing improved, yet still above target fasting and postprandial glycemic profile.  Her POCT A1c today is 8.1%, improving from previous visit of 9%.  She says her glucose would have been better, but she went on vacation to the beach and consumed foods she knows weren't the healthiest.  She is also planning on having surgery on her right knee soon.  She denies any hypoglycemia.  - Notnamed W Kinzler has currently uncontrolled symptomatic type 2 DM since 65 years of age.  -Recent labs reviewed.  - I had a long discussion with her about the progressive nature of diabetes and the pathology behind its complications. -her diabetes is complicated by CVA and she remains at a high risk for more acute and chronic complications which include CAD, CVA, CKD, retinopathy, and neuropathy. These are all discussed in detail with her.  - Nutritional counseling repeated at each appointment due to patients tendency to fall back in to old habits.  - The patient admits there is a room for improvement in their diet and drink choices. -  Suggestion is made for the patient to avoid simple carbohydrates from their diet including Cakes, Sweet Desserts / Pastries, Ice Cream, Soda (diet and regular), Sweet Tea, Candies, Chips, Cookies, Sweet Pastries, Store Bought Juices, Alcohol in Excess of 1-2 drinks a day, Artificial Sweeteners, Coffee Creamer,  and "Sugar-free" Products. This will help patient to have stable blood glucose profile and potentially avoid unintended weight gain.   - I encouraged the patient to switch to unprocessed or minimally processed complex starch and increased protein intake (animal or plant source), fruits, and vegetables.   - Patient is advised to stick to a routine mealtimes to eat 3 meals a day and avoid unnecessary snacks (to snack  only to correct hypoglycemia).  - I have approached her with the following individualized plan to manage her diabetes and patient agrees:   -She is advised to continue Metformin 500 mg ER daily with supper, continue Glimepiride 2 mg po twice daily with meals and to increase her Trulicity to 4.5 mg SQ weekly as she tolerated the 3 mg dose well.   -she is encouraged to continue monitoring blood glucose twice daily, before breakfast and before bed, and to call the clinic if she has readings less than 70 or greater than 300 for 3 tests in a row.   - Adjustment parameters are given to her for hypo and hyperglycemia in writing.  - Specific targets for  A1c; LDL, HDL, and Triglycerides were discussed with the patient.  2) Blood Pressure /Hypertension:  her blood pressure is controlled to target.   she is advised to continue her current medications including Atenolol 25 mg po daily, Lasix 20 mg as needed for edema, and Losartan 25 mg po daily.    3) Lipids/Hyperlipidemia:    Her most recent lipid panel from 12/07/20 showed controlled LDL of 43 and slightly elevated triglycerides of 155.  she is advised to continue Lipitor 20 mg daily at bedtime.  Side effects and precautions discussed with her.    4)  Weight/Diet:  her Body mass index is 35.7 kg/m.  -  clearly complicating her diabetes care.   she is a candidate for weight loss. I discussed with her the fact that loss of 5 - 10% of her  current body weight will have the most impact on her diabetes management.  Exercise, and detailed carbohydrates information provided  -  detailed on discharge instructions.  5) Chronic Care/Health Maintenance: -she is on ACEI/ARB and Statin medications and is encouraged to initiate and continue to follow up with Ophthalmology, Dentist, Podiatrist at least yearly or according to recommendations, and advised to stay away from smoking. I have recommended yearly flu vaccine and pneumonia vaccine at least every 5 years;  moderate intensity exercise for up to 150 minutes weekly; and sleep for at least 7 hours a day.  - she is advised to maintain close follow up with Redmond School, MD for primary care needs, as well as her other providers for optimal and coordinated care.       I spent 30 minutes in the care of the patient today including review of labs from [place], Lipids, Thyroid Function, Hematology (current and previous including abstractions from other facilities); face-to-face time discussing  her blood glucose readings/logs, discussing hypoglycemia and hyperglycemia episodes and symptoms, medications doses, her options of short and long term treatment based on the latest standards of care / guidelines;  discussion about incorporating lifestyle medicine;  and documenting the encounter.    Please refer to Patient Instructions for Blood Glucose Monitoring and Insulin/Medications Dosing Guide"  in media tab for additional information. Please  also refer to " Patient Self Inventory" in the Media  tab for reviewed elements of pertinent patient history.  [name] participated in the discussions, expressed understanding,  and voiced agreement with the above plans.  All questions were answered to her satisfaction. she is encouraged to contact clinic should she have any questions or concerns prior to her return visit.     Follow up plan: - Return in about 3 months (around 05/04/2021) for Diabetes F/U with A1c in office, Previsit labs, Bring meter and logs.    Rayetta Pigg, [facility] [address] [place], [place] [address] Phone: [phone] Fax: [phone]  02/01/2021, 10:18 AM

## 2021-02-06 ENCOUNTER — Encounter: Payer: Medicare Other | Attending: "Endocrinology | Admitting: Nutrition

## 2021-02-06 ENCOUNTER — Other Ambulatory Visit: Payer: Self-pay

## 2021-02-06 ENCOUNTER — Encounter: Payer: Self-pay | Admitting: Nutrition

## 2021-02-06 VITALS — Ht 63.0 in | Wt 211.0 lb

## 2021-02-06 DIAGNOSIS — E669 Obesity, unspecified: Secondary | ICD-10-CM | POA: Insufficient documentation

## 2021-02-06 DIAGNOSIS — E1142 Type 2 diabetes mellitus with diabetic polyneuropathy: Secondary | ICD-10-CM | POA: Insufficient documentation

## 2021-02-06 NOTE — Progress Notes (Signed)
Medical Nutrition Therapy  Follow up  [place]. Appointment Start time:  11300  Appointment End time: 1330  Primary concerns today: Dm Type 2, Obesity  Referral diagnosis: E11.8, E66.9 Preferred learning style:  no preference indicated) Learning readiness: change in progress   NUTRITION ASSESSMENT  She is trying to get her A1C down before having knee surgery for better healing and outcomes. A1C down to 8.1% down from 9%. Having knee surgery tomorrow. FBS 186 mg/dl.. Sometimes gets up in the middle of the night to eat something because she is hungry. Diet is still a work in progress. Trying to eat better foods. Sometimes isn't hungry and skips meals.  Drinking more wate and putting lemon in it.  Anthropometrics  Wt Readings from Last 3 Encounters:  02/01/21 208 lb (94.3 kg)  12/25/20 208 lb (94.3 kg)  12/07/20 214 lb (97.1 kg)   Ht Readings from Last 3 Encounters:  02/01/21 '5\' 4"'$  (1.626 m)  12/25/20 '5\' 4"'$  (1.626 m)  12/07/20 '5\' 4"'$  (1.626 m)   There is no height or weight on file to calculate BMI. '@BMIFA'$ @ Facility age limit for growth percentiles is 20 years. Facility age limit for growth percentiles is 20 years.    Clinical Medical Hx: See chart Medications: Trulicity, Glimiperide, Metformin 500 mg daily, Labs:  Lab Results  Component Value Date   HGBA1C 8.1 (A) 02/01/2021   CMP Latest Ref Rng & Units 12/07/2020 02/09/2020 12/03/2019  Glucose 65 - 99 mg/dL 206(H) 229(H) 219(H)  BUN 8 - 27 mg/dL '13 12 17  '$ Creatinine 0.57 - 1.00 mg/dL 1.00 1.21(H) 1.06(H)  Sodium 134 - 144 mmol/L 143 138 136  Potassium 3.5 - 5.2 mmol/L 4.4 4.5 3.6  Chloride 96 - 106 mmol/L 103 103 101  CO2 20 - 29 mmol/L '25 28 25  '$ Calcium 8.7 - 10.3 mg/dL 9.5 10.0 8.7(L)  Total Protein 6.0 - 8.5 g/dL 6.0 6.5 6.9  Total Bilirubin 0.0 - 1.2 mg/dL 0.6 0.8 1.0  Alkaline Phos 44 - 121 IU/L 71 69 53  AST 0 - 40 IU/L 31 29 45(H)  ALT 0 - 32 IU/L 27 28 47(H)    lifestyle & Dietary Hx She lives with her husband.   Estimated daily fluid intake: 64 oz Supplements:  Sleep: doesn't sleep well at night due to knee pain Stress / self-care: upcoming surgery and medical issues  Current average weekly physical activity: ADL      24 hr recall B) 1 egg, 2 small biscuits, sausage,  1/2 dt mt dew and water L) Peach tortilla for lunch; usually have salad with protein,  Dinner: Steak, 1/2, 1 slice toast, corn, water   Estimated Energy Needs Calories: 1200 Carbohydrate: 135g Protein: 90g Fat: 44g   NUTRITION DIAGNOSIS  NB-1.1 Food and nutrition-related knowledge deficit As related to Diabetes Type 2.  As evidenced by A1C 9%.   NUTRITION INTERVENTION  Nutrition education (E-1) on the following topics:  Nutrition and Diabetes education provided on My Plate, CHO counting, meal planning, portion sizes, timing of meals, avoiding snacks between meals unless having a low blood sugar, target ranges for A1C and blood sugars, signs/symptoms and treatment of hyper/hypoglycemia, monitoring blood sugars, taking medications as prescribed, benefits of exercising 30 minutes per day and prevention of complications of DM.   Handouts Provided Include  Nutrition lifestyle  Learning Style & Readiness for Change Teaching method utilized: Visual & Auditory  Demonstrated degree of understanding via: Teach Back  Barriers to  learning/adherence to lifestyle change: knee issues for exercise  Goals Established by Pt Increase lower carb vegetables with lunch and dinner Cut out diet sodas. Get A1C 7.5% Increase activity when allowed after knee surgery.  MONITORING & EVALUATION Dietary intake, weekly physical activity, and blood sugars in 3 month.  Next Steps  Patient is to work on menu planning and better balanced meals.Marland Kitchen

## 2021-02-06 NOTE — Patient Instructions (Signed)
Goals  Eat three meals per day DOn't skip meals Drink water Avoid eating after 7 pm. Try healthy choice meals or soups

## 2021-02-07 DIAGNOSIS — S83272A Complex tear of lateral meniscus, current injury, left knee, initial encounter: Secondary | ICD-10-CM | POA: Diagnosis not present

## 2021-02-07 DIAGNOSIS — S83282A Other tear of lateral meniscus, current injury, left knee, initial encounter: Secondary | ICD-10-CM | POA: Diagnosis not present

## 2021-02-07 DIAGNOSIS — M94262 Chondromalacia, left knee: Secondary | ICD-10-CM | POA: Diagnosis not present

## 2021-02-07 DIAGNOSIS — S83232A Complex tear of medial meniscus, current injury, left knee, initial encounter: Secondary | ICD-10-CM | POA: Diagnosis not present

## 2021-02-07 DIAGNOSIS — S83242A Other tear of medial meniscus, current injury, left knee, initial encounter: Secondary | ICD-10-CM | POA: Diagnosis not present

## 2021-02-12 ENCOUNTER — Ambulatory Visit (HOSPITAL_COMMUNITY)
Admission: RE | Admit: 2021-02-12 | Discharge: 2021-02-12 | Disposition: A | Discharge status: Home / Self Care | Payer: Medicare Other | Source: Ambulatory Visit | Attending: Physician Assistant | Admitting: Physician Assistant

## 2021-02-12 ENCOUNTER — Other Ambulatory Visit: Payer: Self-pay

## 2021-02-12 DIAGNOSIS — Z853 Personal history of malignant neoplasm of breast: Secondary | ICD-10-CM | POA: Insufficient documentation

## 2021-02-12 DIAGNOSIS — R928 Other abnormal and inconclusive findings on diagnostic imaging of breast: Secondary | ICD-10-CM | POA: Insufficient documentation

## 2021-02-12 DIAGNOSIS — Z9889 Other specified postprocedural states: Secondary | ICD-10-CM

## 2021-02-12 DIAGNOSIS — R922 Inconclusive mammogram: Secondary | ICD-10-CM | POA: Diagnosis not present

## 2021-02-13 DIAGNOSIS — E1122 Type 2 diabetes mellitus with diabetic chronic kidney disease: Secondary | ICD-10-CM | POA: Diagnosis not present

## 2021-02-13 DIAGNOSIS — N1831 Chronic kidney disease, stage 3a: Secondary | ICD-10-CM | POA: Diagnosis not present

## 2021-02-13 DIAGNOSIS — I129 Hypertensive chronic kidney disease with stage 1 through stage 4 chronic kidney disease, or unspecified chronic kidney disease: Secondary | ICD-10-CM | POA: Diagnosis not present

## 2021-02-14 ENCOUNTER — Ambulatory Visit: Payer: Medicare Other | Admitting: Oncology

## 2021-02-14 ENCOUNTER — Other Ambulatory Visit: Payer: Medicare Other

## 2021-02-15 DIAGNOSIS — S83242D Other tear of medial meniscus, current injury, left knee, subsequent encounter: Secondary | ICD-10-CM | POA: Diagnosis not present

## 2021-02-15 DIAGNOSIS — S83282D Other tear of lateral meniscus, current injury, left knee, subsequent encounter: Secondary | ICD-10-CM | POA: Diagnosis not present

## 2021-02-20 ENCOUNTER — Other Ambulatory Visit: Payer: Self-pay | Admitting: *Deleted

## 2021-02-20 DIAGNOSIS — Z170 Estrogen receptor positive status [ER+]: Secondary | ICD-10-CM

## 2021-02-20 DIAGNOSIS — C50412 Malignant neoplasm of upper-outer quadrant of left female breast: Secondary | ICD-10-CM

## 2021-02-20 NOTE — Progress Notes (Signed)
[facility]  [phone] [phone]     ID: [name] DOB: 24-Jan-1956  MR#: [id number]  ONG#:[id number]  Patient Care Team: Elfredia Nevins, MD as PCP - General (Internal Medicine) Emelia Loron, MD as Consulting Physician (General Surgery) Kolbie Clarkston, Valentino Hue, MD as Consulting Physician (Oncology) Antony Blackbird, MD as Consulting Physician (Radiation Oncology) Malissa Hippo, MD as Consulting Physician (Gastroenterology) Axel Filler, Larna Daughters, NP as Nurse Practitioner (Hematology and Oncology) OTHER MD:  CHIEF COMPLAINT: Estrogen receptor positive breast cancer  CURRENT TREATMENT: Tamoxifen    INTERVAL HISTORY: [name] returns today for follow-up of her estrogen receptor positive breast cancer.   She continues on tamoxifen.  She continues to tolerate this with "acceptable hot flashes" as her main side effect.  Since her last visit, she underwent bilateral diagnostic mammography with tomography and right breast ultrasonography at [facility] on 02/12/2021, showing: breast density category B; probably-benign ductal ectasia in retroareolar left breast. Short-term (6 month) follow up recommended.   REVIEW OF SYSTEMS: [name] is very anxious because of the recent reading on her mammogram and we discussed that at length.  She had left knee surgery under Dr. Eulah Pont and is very pleased with the results.  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  She tells me she will be following up with Dr. Venetia Maxon regarding that.  She does yard work for exercise.  Detailed review of systems was otherwise stable.   COVID 19 VACCINATION STATUS: Moderna x2   BREAST CANCER HISTORY: From the original intake note:  [name] had bilateral screening mammography with tomography at [facility] 08/28/2016. There was a possible mass in the left breast and on 09/16/2016 she was brought back for left diagnostic mammography with  tomography and left breast ultrasonography. The left breast density was category B. In the left breast there was an irregular mass with architectural distortion in the upper outer quadrant, which appear to measure approximately 0.8 cm. Thank you by ultrasound this measured 0.9 cm and it was located at the 2:00 axis 7 cm from the nipple. Ultrasound of the axilla was reported as negative.   Biopsy of the left breast mass in question 09/23/2016 showed [facility] [phone]) ductal carcinoma in situ, E-cadherin positive, estrogen receptor 100% positive with strong staining intensity, progesterone receptor 90% positive with moderate staining intensity, with an MIB-1 of 10%, and no HER-2 amplification, signals ratio being 1.05 and the number per cell 1.10.  Her subsequent history is as detailed below   PAST MEDICAL HISTORY: Past Medical History:  Diagnosis Date   Anxiety    Asthma    Cancer (HCC)    Left breast   Complication of anesthesia    pt states she is hard to wake up after anesthetic-"she was told this at [facility]."    Constipation    Constipation    DDD (degenerative disc disease), lumbar    Depression    situational   Diabetes mellitus    Type II   Dyspnea    Family history of adverse reaction to anesthesia    Brother- N/V   Fibromyalgia    Gait instability    GERD (gastroesophageal reflux disease)    Headache    History of blood transfusion    with childbirth   History of radiation therapy 12/01/16-01/08/17   left breast 50.4 Gy in 28 fractions   Hypertension    Pneumonia 2008   PONV (postoperative nausea  and vomiting)    Skin cancer    Stroke [facility])    mini stroke 2008    PAST SURGICAL HISTORY: Past Surgical History:  Procedure Laterality Date   ABDOMINAL HYSTERECTOMY     Cervical- cancer cell   ANTERIOR CERVICAL DISCECTOMY  2007   BACK SURGERY  2009   Laminectomy   Back surgery 4 yrs ago  2008   BALLOON DILATION  10/02/2011   Procedure: BALLOON DILATION;   Surgeon: Malissa Hippo, MD;  Location: [facility];  Service: Endoscopy;  Laterality: N/A;   BREAST LUMPECTOMY WITH RADIOACTIVE SEED AND SENTINEL LYMPH NODE BIOPSY Left 10/13/2016   Procedure: BREAST LUMPECTOMY WITH RADIOACTIVE SEED AND SENTINEL LYMPH NODE BIOPSY;  Surgeon: Emelia Loron, MD;  Location: [facility] OR;  Service: General;  Laterality: Left;   CHOLECYSTECTOMY     COLONOSCOPY WITH PROPOFOL N/A 04/18/2016   Procedure: COLONOSCOPY WITH PROPOFOL;  Surgeon: Malissa Hippo, MD;  Location: [facility];  Service: Endoscopy;  Laterality: N/A;  955   FOOT SURGERY     tried to remove bone spur- was unable- "just clean"   MALONEY DILATION  10/02/2011   Procedure: MALONEY DILATION;  Surgeon: Malissa Hippo, MD;  Location: [facility];  Service: Endoscopy;  Laterality: N/A;   SAVORY DILATION  10/02/2011   Procedure: SAVORY DILATION;  Surgeon: Malissa Hippo, MD;  Location: [facility];  Service: Endoscopy;  Laterality: N/A;    FAMILY HISTORY Family History  Problem Relation Age of Onset   Heart attack Mother    Lung cancer Father   The patient's father died at the age of 32, 4 years after being diagnosed with lung cancer in the setting of tobacco abuse. The patient's mother died at the age of 80 from a heart attack. The patient had 6 brothers, 3 sisters. There is no history of breast or ovarian cancer in the family to her knowledge.   GYNECOLOGIC HISTORY:  No LMP recorded. Patient has had a hysterectomy. Menarche age 53, first live birth age 63, the patient is GX P2. She underwent hysterectomy with unilateral salpingo-oophorectomy at age 42. She used estradiol as hormone replacement until her diagnosis of breast cancer in April 2018.   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 She also has significant fibromyalgia. She is separated but not divorced from her husband Genesa Argento.  (As of 05/05/2017 they were back living  together).  He has significant medical problems. The patient's son Haywood Filler lives in [place]; he works at [employer]. The patient tells me Thayer Ohm was married in [place] because it was his husband's dream. She greatly enjoyed the wedding. Son Letha Cape lives in [place] and works in a Airline pilot. The patient has no grandchildren. She is a Control and instrumentation engineer.    ADVANCED DIRECTIVES: Not in place; patient is comfortable having her husband as healthcare power of attorney   HEALTH MAINTENANCE: Social History   Tobacco Use   Smoking status: Never   Smokeless tobacco: Never  Vaping Use   Vaping Use: Never used  Substance Use Topics   Alcohol use: Yes    Alcohol/week: 0.0 standard drinks    Comment: glass of wine- occasional   Drug use: Never     Colonoscopy: November 2017/ Dr. Karilyn Cota  PAP:  Bone density:   Allergies  Allergen Reactions   Adhesive [Tape] Other (See Comments)    Tears skin  Elemental Sulfur Other (See Comments)    Turns very red   Lyrica [Pregabalin] Swelling   Amoxicillin-Pot Clavulanate Nausea And Vomiting   Codeine Itching   Penicillins Other (See Comments)    Upset stomach   Sulfonamide Derivatives Other (See Comments)    Burning sensation    Current Outpatient Medications  Medication Sig Dispense Refill   ALPRAZolam (XANAX) 1 MG tablet Take 1 mg by mouth 4 (four) times daily as needed. Reports taking 0.5 tab QHS.     atenolol (TENORMIN) 25 MG tablet Take 25 mg by mouth daily.      atorvastatin (LIPITOR) 20 MG tablet Take 20 mg by mouth daily.     Dulaglutide (TRULICITY) 4.5 MG/0.5ML SOPN Inject 4.5 mg as directed once a week. 6 mL 3   glimepiride (AMARYL) 4 MG tablet Take 2 mg by mouth 2 (two) times daily.     lidocaine (LIDODERM) 5 % Place 1 patch onto the skin daily as needed (pain). Remove & Discard patch within 12 hours or as directed by MD      losartan (COZAAR) 25 MG tablet Take 25 mg by mouth daily.     metFORMIN (GLUCOPHAGE) 500 MG tablet Take 500  mg by mouth 2 (two) times daily with a meal.     Oxycodone HCl 10 MG TABS Take 10 mg by mouth every 6 (six) hours as needed (pain).      pantoprazole (PROTONIX) 40 MG tablet Take 40 mg by mouth daily.     tamoxifen (NOLVADEX) 20 MG tablet Take 1 tablet (20 mg total) by mouth every morning. 90 tablet 0   venlafaxine XR (EFFEXOR-XR) 37.5 MG 24 hr capsule TAKE ONE TABLET BY MOUTH EVERY MORNING WITH BREAKFAST 90 capsule 0   No current facility-administered medications for this visit.    OBJECTIVE: White woman in no acute distress  Vitals:   02/21/21 1050  BP: 126/64  Pulse: 86  Resp: 18  Temp: (!) 97.5 F (36.4 C)  SpO2: 98%     Wt Readings from Last 3 Encounters:  02/21/21 204 lb 6.4 oz (92.7 kg)  02/06/21 211 lb (95.7 kg)  02/01/21 208 lb (94.3 kg)   Body mass index is 36.21 kg/m.      ECOG FS:1 - Symptomatic but completely ambulatory  Sclerae unicteric, EOMs intact Wearing a mask No cervical or supraclavicular adenopathy Lungs no rales or rhonchi Heart regular rate and rhythm Abd soft, obese, nontender, positive bowel sounds MSK no focal spinal tenderness, no upper extremity lymphedema Neuro: well oriented, appropriate affect Breasts: The right breast is unremarkable.  The left breast is status post lumpectomy and radiation.  There is no evidence of local recurrence.  Both axillae are benign.   LAB RESULTS:  CMP     Component Value Date/Time   NA 138 02/21/2021 1033   NA 143 12/07/2020 0917   NA 140 05/05/2017 1431   K 4.6 02/21/2021 1033   K 4.4 05/05/2017 1431   CL 103 02/21/2021 1033   CO2 25 02/21/2021 1033   CO2 26 05/05/2017 1431   GLUCOSE 259 (H) 02/21/2021 1033   GLUCOSE 194 (H) 05/05/2017 1431   BUN 20 02/21/2021 1033   BUN 13 12/07/2020 0917   BUN 15.0 05/05/2017 1431   CREATININE 1.09 (H) 02/21/2021 1033   CREATININE 1.2 (H) 05/05/2017 1431   CALCIUM 9.4 02/21/2021 1033   CALCIUM 9.7 05/05/2017 1431   PROT 6.4 (L) 02/21/2021 1033   PROT 6.0  12/07/2020 8657  PROT 7.3 05/05/2017 1431   ALBUMIN 3.6 02/21/2021 1033   ALBUMIN 3.9 12/07/2020 0917   ALBUMIN 3.8 05/05/2017 1431   AST 32 02/21/2021 1033   AST 39 (H) 05/05/2017 1431   ALT 35 02/21/2021 1033   ALT 51 05/05/2017 1431   ALKPHOS 66 02/21/2021 1033   ALKPHOS 74 05/05/2017 1431   BILITOT 0.6 02/21/2021 1033   BILITOT 0.77 05/05/2017 1431   GFRNONAA 57 (L) 02/21/2021 1033   GFRAA 56 (L) 02/09/2020 0843    No results found for: Dorene Ar, A1GS, A2GS, BETS, BETA2SER, GAMS, MSPIKE, SPEI  No results found for: Ron Parker, [facility]  Lab Results  Component Value Date   WBC 8.3 02/21/2021   NEUTROABS 4.7 02/21/2021   HGB 14.1 02/21/2021   HCT 41.0 02/21/2021   MCV 91.7 02/21/2021   PLT 155 02/21/2021      Chemistry      Component Value Date/Time   NA 138 02/21/2021 1033   NA 143 12/07/2020 0917   NA 140 05/05/2017 1431   K 4.6 02/21/2021 1033   K 4.4 05/05/2017 1431   CL 103 02/21/2021 1033   CO2 25 02/21/2021 1033   CO2 26 05/05/2017 1431   BUN 20 02/21/2021 1033   BUN 13 12/07/2020 0917   BUN 15.0 05/05/2017 1431   CREATININE 1.09 (H) 02/21/2021 1033   CREATININE 1.2 (H) 05/05/2017 1431      Component Value Date/Time   CALCIUM 9.4 02/21/2021 1033   CALCIUM 9.7 05/05/2017 1431   ALKPHOS 66 02/21/2021 1033   ALKPHOS 74 05/05/2017 1431   AST 32 02/21/2021 1033   AST 39 (H) 05/05/2017 1431   ALT 35 02/21/2021 1033   ALT 51 05/05/2017 1431   BILITOT 0.6 02/21/2021 1033   BILITOT 0.77 05/05/2017 1431       No results found for: LABCA2  No components found for: [id number]  No results for input(s): INR in the last 168 hours.  Urinalysis    Component Value Date/Time   COLORURINE YELLOW 07/30/2014 1600   APPEARANCEUR CLEAR 07/30/2014 1600   LABSPEC 1.020 07/30/2014 1600   PHURINE 5.5 07/30/2014 1600   GLUCOSEU >1000 (A) 07/30/2014 1600   HGBUR NEGATIVE 07/30/2014 1600   BILIRUBINUR NEGATIVE 07/30/2014 1600    KETONESUR NEGATIVE 07/30/2014 1600   PROTEINUR NEGATIVE 07/30/2014 1600   UROBILINOGEN 0.2 07/30/2014 1600   NITRITE NEGATIVE 07/30/2014 1600   LEUKOCYTESUR NEGATIVE 07/30/2014 1600    STUDIES: MM DIAG BREAST TOMO BILATERAL  Result Date: 02/12/2021 CLINICAL DATA:  65 year old female with history of left breast cancer post lumpectomy 10/13/2016. EXAM: DIGITAL DIAGNOSTIC BILATERAL MAMMOGRAM WITH TOMOSYNTHESIS AND CAD TECHNIQUE: Bilateral digital diagnostic mammography and breast tomosynthesis was performed. The images were evaluated with computer-aided detection. COMPARISON:  Previous exams. ACR Breast Density Category b: There are scattered areas of fibroglandular density. FINDINGS: Lumpectomy changes again identified in the upper-outer left breast, stable in appearance. Spot compression magnification view of the left breast lumpectomy site does not show any evidence of locally recurrent malignancy. There is a tubular asymmetry in the lower retroareolar left breast. No suspicious masses or calcifications seen in the right breast. Targeted ultrasound of the left breast was performed demonstrating several fluid-filled dilated ducts in the retroareolar left breast with a representative portion of a dilated duct at 6 o'clock retroareolar measuring 1.3 x 0.3 x 0.8 cm. No intraductal masses identified. This likely corresponds with the asymmetry seen in the left breast at mammography likely ductal ectasia. IMPRESSION: Probably  benign ductal ectasia in the retroareolar left breast. RECOMMENDATION: Diagnostic mammography of the left breast in 6 months with possible ultrasound. I have discussed the findings and recommendations with the patient. If applicable, a reminder letter will be sent to the patient regarding the next appointment. BI-RADS CATEGORY  3: Probably benign. Electronically Signed   By: Edwin Cap M.D.   On: 02/12/2021 16:49     ELIGIBLE FOR AVAILABLE RESEARCH PROTOCOL: no  ASSESSMENT: 65  y.o. [name], [place] woman status post left breast upper outer quadrant biopsy 09/23/2016 for a clinical T1b N0, [place] IA t invasive ductal carcinoma, grade 1, estrogen and progesterone receptor positive, HER-2 not amplified, with an MIB-1 of 10%  (1) status post left lumpectomy and sentinel lymph node sampling 10/13/2016 for a pT1c pN0, stage IA invasive ductal carcinoma, grade 1, with negative margins.  (2) Oncotype DX score of 25 predicts a 10 year risk of recurrence outside the breast of 17% if the patient's only systemic therapy is tamoxifen for 5 years  (a) the possible benefit from chemotherapy in the 5% range was discussed. The patient declined adjuvant chemotherapy  (3) adjuvant radiation6/18/18 - 01/08/17  Left Breast// 50.4 Gy in 28 fractions   (4) started anastrozole July 2018; discontinued September 2018 with side effects  (a) bone density 12/01/2016 shows a T score of 1.3 (normal).  (5) started tamoxifen 05/16/2017   PLAN: [name] is now 4-1/2 years out from definitive surgery for her breast cancer with no evidence of disease recurrence.  This is very favorable.  She is tolerating tamoxifen well and the plan will be to continue that a total of 5years which will take [place] to late 2023.  I have encouraged her to improve her exercise program as soon as her knee starts becoming more functional.  We discussed the ectasia noted on the recent breast imaging.  This finding can be related to noninvasive breast cancer but frequently is a benign finding and really repeating the studies in 6 months, which is the plan, is very reasonable.  She is accepting of this plan and she will see [place] shortly after that to discuss results  Total encounter time 25 minutes.   Cambell Stanek, Valentino Hue, MD  02/21/21 5:11 PM Medical Oncology and Hematology [facility] [address] [place], [place] [address] Tel. [phone]    Fax. [phone]   I, Mickie Bail, am acting as scribe for  Dr. Valentino Hue. Heleena Miceli.  I, Ruthann Cancer MD, have reviewed the above documentation for accuracy and completeness, and I agree with the above.   *Total Encounter Time as defined by the Centers for Medicare and Medicaid Services includes, in addition to the face-to-face time of a patient visit (documented in the note above) non-face-to-face time: obtaining and reviewing outside history, ordering and reviewing medications, tests or procedures, care coordination (communications with other health care professionals or caregivers) and documentation in the medical record.

## 2021-02-21 ENCOUNTER — Inpatient Hospital Stay: Payer: Medicare Other | Attending: Oncology

## 2021-02-21 ENCOUNTER — Inpatient Hospital Stay: Payer: Medicare Other | Admitting: Oncology

## 2021-02-21 ENCOUNTER — Other Ambulatory Visit: Payer: Self-pay

## 2021-02-21 VITALS — BP 126/64 | HR 86 | Temp 97.5°F | Resp 18 | Ht 63.0 in | Wt 204.4 lb

## 2021-02-21 DIAGNOSIS — Z79899 Other long term (current) drug therapy: Secondary | ICD-10-CM | POA: Insufficient documentation

## 2021-02-21 DIAGNOSIS — C50412 Malignant neoplasm of upper-outer quadrant of left female breast: Secondary | ICD-10-CM | POA: Diagnosis not present

## 2021-02-21 DIAGNOSIS — Z79810 Long term (current) use of selective estrogen receptor modulators (SERMs): Secondary | ICD-10-CM | POA: Diagnosis not present

## 2021-02-21 DIAGNOSIS — Z923 Personal history of irradiation: Secondary | ICD-10-CM | POA: Insufficient documentation

## 2021-02-21 DIAGNOSIS — Z170 Estrogen receptor positive status [ER+]: Secondary | ICD-10-CM | POA: Diagnosis not present

## 2021-02-21 LAB — CBC WITH DIFFERENTIAL (CANCER CENTER ONLY)
Abs Immature Granulocytes: 0.02 10*3/uL (ref 0.00–0.07)
Basophils Absolute: 0 10*3/uL (ref 0.0–0.1)
Basophils Relative: 0 %
Eosinophils Absolute: 0.1 10*3/uL (ref 0.0–0.5)
Eosinophils Relative: 1 %
HCT: 41 % (ref 36.0–46.0)
Hemoglobin: 14.1 g/dL (ref 12.0–15.0)
Immature Granulocytes: 0 %
Lymphocytes Relative: 33 %
Lymphs Abs: 2.8 10*3/uL (ref 0.7–4.0)
MCH: 31.5 pg (ref 26.0–34.0)
MCHC: 34.4 g/dL (ref 30.0–36.0)
MCV: 91.7 fL (ref 80.0–100.0)
Monocytes Absolute: 0.6 10*3/uL (ref 0.1–1.0)
Monocytes Relative: 8 %
Neutro Abs: 4.7 10*3/uL (ref 1.7–7.7)
Neutrophils Relative %: 58 %
Platelet Count: 155 10*3/uL (ref 150–400)
RBC: 4.47 MIL/uL (ref 3.87–5.11)
RDW: 12.5 % (ref 11.5–15.5)
WBC Count: 8.3 10*3/uL (ref 4.0–10.5)
nRBC: 0 % (ref 0.0–0.2)

## 2021-02-21 LAB — CMP (CANCER CENTER ONLY)
ALT: 35 U/L (ref 0–44)
AST: 32 U/L (ref 15–41)
Albumin: 3.6 g/dL (ref 3.5–5.0)
Alkaline Phosphatase: 66 U/L (ref 38–126)
Anion gap: 10 (ref 5–15)
BUN: 20 mg/dL (ref 8–23)
CO2: 25 mmol/L (ref 22–32)
Calcium: 9.4 mg/dL (ref 8.9–10.3)
Chloride: 103 mmol/L (ref 98–111)
Creatinine: 1.09 mg/dL — ABNORMAL HIGH (ref 0.44–1.00)
GFR, Estimated: 57 mL/min — ABNORMAL LOW (ref >60)
Glucose, Bld: 259 mg/dL — ABNORMAL HIGH (ref 70–99)
Potassium: 4.6 mmol/L (ref 3.5–5.1)
Sodium: 138 mmol/L (ref 135–145)
Total Bilirubin: 0.6 mg/dL (ref 0.3–1.2)
Total Protein: 6.4 g/dL — ABNORMAL LOW (ref 6.5–8.1)

## 2021-02-21 MED ORDER — VENLAFAXINE HCL ER 37.5 MG PO CP24: ORAL_CAPSULE | ORAL | 0 refills | Status: DC

## 2021-02-21 MED ORDER — TAMOXIFEN CITRATE 20 MG PO TABS: 20.0000 mg | ORAL_TABLET | Freq: Every morning | ORAL | 0 refills | Status: DC

## 2021-03-04 DIAGNOSIS — M5416 Radiculopathy, lumbar region: Secondary | ICD-10-CM | POA: Diagnosis not present

## 2021-03-04 DIAGNOSIS — M5136 Other intervertebral disc degeneration, lumbar region: Secondary | ICD-10-CM | POA: Diagnosis not present

## 2021-03-04 DIAGNOSIS — G894 Chronic pain syndrome: Secondary | ICD-10-CM | POA: Diagnosis not present

## 2021-03-04 DIAGNOSIS — J22 Unspecified acute lower respiratory infection: Secondary | ICD-10-CM | POA: Diagnosis not present

## 2021-03-04 DIAGNOSIS — G629 Polyneuropathy, unspecified: Secondary | ICD-10-CM | POA: Diagnosis not present

## 2021-03-04 DIAGNOSIS — Z681 Body mass index (BMI) 19 or less, adult: Secondary | ICD-10-CM | POA: Diagnosis not present

## 2021-03-05 ENCOUNTER — Other Ambulatory Visit: Payer: Self-pay | Admitting: Oncology

## 2021-03-15 DIAGNOSIS — E1122 Type 2 diabetes mellitus with diabetic chronic kidney disease: Secondary | ICD-10-CM | POA: Diagnosis not present

## 2021-03-15 DIAGNOSIS — I129 Hypertensive chronic kidney disease with stage 1 through stage 4 chronic kidney disease, or unspecified chronic kidney disease: Secondary | ICD-10-CM | POA: Diagnosis not present

## 2021-03-15 DIAGNOSIS — N1831 Chronic kidney disease, stage 3a: Secondary | ICD-10-CM | POA: Diagnosis not present

## 2021-04-01 DIAGNOSIS — M5416 Radiculopathy, lumbar region: Secondary | ICD-10-CM | POA: Diagnosis not present

## 2021-04-01 DIAGNOSIS — M791 Myalgia, unspecified site: Secondary | ICD-10-CM | POA: Diagnosis not present

## 2021-04-01 DIAGNOSIS — G894 Chronic pain syndrome: Secondary | ICD-10-CM | POA: Diagnosis not present

## 2021-04-01 DIAGNOSIS — G629 Polyneuropathy, unspecified: Secondary | ICD-10-CM | POA: Diagnosis not present

## 2021-04-01 DIAGNOSIS — Z23 Encounter for immunization: Secondary | ICD-10-CM | POA: Diagnosis not present

## 2021-04-01 DIAGNOSIS — M1991 Primary osteoarthritis, unspecified site: Secondary | ICD-10-CM | POA: Diagnosis not present

## 2021-04-02 DIAGNOSIS — M21372 Foot drop, left foot: Secondary | ICD-10-CM | POA: Diagnosis not present

## 2021-04-10 ENCOUNTER — Encounter (INDEPENDENT_AMBULATORY_CARE_PROVIDER_SITE_OTHER): Payer: Self-pay | Admitting: *Deleted

## 2021-04-10 DIAGNOSIS — E119 Type 2 diabetes mellitus without complications: Secondary | ICD-10-CM | POA: Diagnosis not present

## 2021-04-10 LAB — HM DIABETES EYE EXAM

## 2021-04-15 DIAGNOSIS — E1122 Type 2 diabetes mellitus with diabetic chronic kidney disease: Secondary | ICD-10-CM | POA: Diagnosis not present

## 2021-04-15 DIAGNOSIS — I129 Hypertensive chronic kidney disease with stage 1 through stage 4 chronic kidney disease, or unspecified chronic kidney disease: Secondary | ICD-10-CM | POA: Diagnosis not present

## 2021-04-15 DIAGNOSIS — E782 Mixed hyperlipidemia: Secondary | ICD-10-CM | POA: Diagnosis not present

## 2021-04-15 DIAGNOSIS — N1831 Chronic kidney disease, stage 3a: Secondary | ICD-10-CM | POA: Diagnosis not present

## 2021-04-30 ENCOUNTER — Other Ambulatory Visit (INDEPENDENT_AMBULATORY_CARE_PROVIDER_SITE_OTHER): Payer: Self-pay

## 2021-04-30 ENCOUNTER — Telehealth (INDEPENDENT_AMBULATORY_CARE_PROVIDER_SITE_OTHER): Payer: Self-pay

## 2021-04-30 ENCOUNTER — Encounter (INDEPENDENT_AMBULATORY_CARE_PROVIDER_SITE_OTHER): Payer: Self-pay

## 2021-04-30 DIAGNOSIS — Z86010 Personal history of colonic polyps: Secondary | ICD-10-CM

## 2021-04-30 DIAGNOSIS — M5416 Radiculopathy, lumbar region: Secondary | ICD-10-CM | POA: Diagnosis not present

## 2021-04-30 DIAGNOSIS — M21372 Foot drop, left foot: Secondary | ICD-10-CM | POA: Diagnosis not present

## 2021-04-30 DIAGNOSIS — G629 Polyneuropathy, unspecified: Secondary | ICD-10-CM | POA: Diagnosis not present

## 2021-04-30 DIAGNOSIS — G894 Chronic pain syndrome: Secondary | ICD-10-CM | POA: Diagnosis not present

## 2021-04-30 NOTE — Telephone Encounter (Signed)
Referring MD/PCP: Gerarda Fraction  Procedure: Tcs    Reason/Indication:  Hx of polyps  Has patient had this procedure before?  yes  If so, when, by whom and where?  04/2016  Is there a family history of colon cancer?  no  Who?  What age when diagnosed?    Is patient diabetic? If yes, Type 1 or Type 2   yes, type 2      Does patient have prosthetic heart valve or mechanical valve?  no  Do you have a pacemaker/defibrillator?  no  Has patient ever had endocarditis/atrial fibrillation? no  Does patient use oxygen? no  Has patient had joint replacement within last 12 months?  no  Is patient constipated or do they take laxatives? yes  Does patient have a history of alcohol/drug use?  no  Have you had a stroke/heart attack last 6 mths? no  Do you take medicine for weight loss?  no  For female patients,: do you still have your menstrual cycle? no  Is patient on blood thinner such as Coumadin, Plavix and/or Aspirin? no  Medications: acarbose 50 mg bid, atenolol 25 mg daily, atorvastatin 20 mg daily, glimepiride 4 mg bid, metformin 500 mg bid, pantoprazole 40 mg daily, tamoxifen 20 mg daily, venlafaxine 12.2 mg daily, Trulicity once a week   Allergies: See Epic  Medication Adjustment per Dr Rehman/Dr Jenetta Downer Hold metformin the evening prior and 1/2 dose of insulin the evening prior no diabetic medications the morning of   Procedure date & time: Wednesday 05/08/21 at 9:20

## 2021-05-03 NOTE — Patient Instructions (Signed)
Colonoscopy, Adult A colonoscopy is a procedure to look at the entire large intestine. This procedure is done using a long, thin, flexible tube that has a camera on the end. You may have a colonoscopy: As a part of normal colorectal screening. If you have certain symptoms, such as: A low number of red blood cells in your blood (anemia). Diarrhea that does not go away. Pain in your abdomen. Blood in your stool. A colonoscopy can help screen for and diagnose medical problems, including: Tumors. Extra tissue that grows where mucus forms (polyps). Inflammation. Areas of bleeding. Tell your health care provider about: Any allergies you have. All medicines you are taking, including vitamins, herbs, eye drops, creams, and over-the-counter medicines. Any problems you or family members have had with anesthetic medicines. Any blood disorders you have. Any surgeries you have had. Any medical conditions you have. Any problems you have had with having bowel movements. Whether you are pregnant or may be pregnant. What are the risks? Generally, this is a safe procedure. However, problems may occur, including: Bleeding. Damage to your intestine. Allergic reactions to medicines given during the procedure. Infection. This is rare. What happens before the procedure? Eating and drinking restrictions Follow instructions from your health care provider about eating or drinking restrictions, which may include: A few days before the procedure: Follow a low-fiber diet. Avoid nuts, seeds, dried fruit, raw fruits, and vegetables. 1-3 days before the procedure: Eat only gelatin dessert or ice pops. Drink only clear liquids, such as water, clear juice, clear broth or bouillon, black coffee or tea, or clear soft drinks or sports drinks. Avoid liquids that contain red or purple dye. The day of the procedure: Do not eat solid foods. You may continue to drink clear liquids until up to 2 hours before the  procedure. Do not eat or drink anything starting 2 hours before the procedure, or within the time period that your health care provider recommends. Bowel prep If you were prescribed a bowel prep to take by mouth (orally) to clean out your colon: Take it as told by your health care provider. Starting the day before your procedure, you will need to drink a large amount of liquid medicine. The liquid will cause you to have many bowel movements of loose stool until your stool becomes almost clear or light green. If your skin or the opening between the buttocks (anus) gets irritated from diarrhea, you may relieve the irritation using: Wipes with medicine in them, such as adult wet wipes with aloe and vitamin E. A product to soothe skin, such as petroleum jelly. If you vomit while drinking the bowel prep: Take a break for up to 60 minutes. Begin the bowel prep again. Call your health care provider if you keep vomiting or you cannot take the bowel prep without vomiting. To clean out your colon, you may also be given: Laxative medicines. These help you have a bowel movement. Instructions for enema use. An enema is liquid medicine injected into your rectum. Medicines Ask your health care provider about: Changing or stopping your regular medicines or supplements. This is especially important if you are taking iron supplements, diabetes medicines, or blood thinners. Taking medicines such as aspirin and ibuprofen. These medicines can thin your blood. Do not take these medicines unless your health care provider tells you to take them. Taking over-the-counter medicines, vitamins, herbs, and supplements. General instructions Ask your health care provider what steps will be taken to help prevent infection. These may include  washing skin with a germ-killing soap. Plan to have someone take you home from the hospital or clinic. What happens during the procedure?  An IV will be inserted into one of your  veins. You may be given one or more of the following: A medicine to help you relax (sedative). A medicine to numb the area (local anesthetic). A medicine to make you fall asleep (general anesthetic). This is rarely needed. You will lie on your side with your knees bent. The tube will: Have oil or gel put on it (be lubricated). Be inserted into your anus. Be gently eased through all parts of your large intestine. Air will be sent into your colon to keep it open. This may cause some pressure or cramping. Images will be taken with the camera and will appear on a screen. A small tissue sample may be removed to be looked at under a microscope (biopsy). The tissue may be sent to a lab for testing if any signs of problems are found. If small polyps are found, they may be removed and checked for cancer cells. When the procedure is finished, the tube will be removed. The procedure may vary among health care providers and hospitals. What happens after the procedure? Your blood pressure, heart rate, breathing rate, and blood oxygen level will be monitored until you leave the hospital or clinic. You may have a small amount of blood in your stool. You may pass gas and have mild cramping or bloating in your abdomen. This is caused by the air that was used to open your colon during the exam. Do not drive for 24 hours after the procedure. It is up to you to get the results of your procedure. Ask your health care provider, or the department that is doing the procedure, when your results will be ready. Summary A colonoscopy is a procedure to look at the entire large intestine. Follow instructions from your health care provider about eating and drinking before the procedure. If you were prescribed an oral bowel prep to clean out your colon, take it as told by your health care provider. During the colonoscopy, a flexible tube with a camera on its end is inserted into the anus and then passed into the other  parts of the large intestine. This information is not intended to replace advice given to you by your health care provider. Make sure you discuss any questions you have with your health care provider. Document Revised: 12/24/2018 Document Reviewed: 12/24/2018 Elsevier Patient Education  [place]. Colonoscopy, Adult, Care After This sheet gives you information about how to care for yourself after your procedure. Your health care provider may also give you more specific instructions. If you have problems or questions, contact your health care provider. What can I expect after the procedure? After the procedure, it is common to have: A small amount of blood in your stool for 24 hours after the procedure. Some gas. Mild cramping or bloating of your abdomen. Follow these instructions at home: Eating and drinking  Drink enough fluid to keep your urine pale yellow. Follow instructions from your health care provider about eating or drinking restrictions. Resume your normal diet as instructed by your health care provider. Avoid heavy or fried foods that are hard to digest. Activity Rest as told by your health care provider. Avoid sitting for a long time without moving. Get up to take short walks every 1-2 hours. This is important to improve blood flow and breathing. Ask for help  if you feel weak or unsteady. Return to your normal activities as told by your health care provider. Ask your health care provider what activities are safe for you. Managing cramping and bloating  Try walking around when you have cramps or feel bloated. Apply heat to your abdomen as told by your health care provider. Use the heat source that your health care provider recommends, such as a moist heat pack or a heating pad. Place a towel between your skin and the heat source. Leave the heat on for 20-30 minutes. Remove the heat if your skin turns bright red. This is especially important if you are unable to feel  pain, heat, or cold. You may have a greater risk of getting burned. General instructions If you were given a sedative during the procedure, it can affect you for several hours. Do not drive or operate machinery until your health care provider says that it is safe. For the first 24 hours after the procedure: Do not sign important documents. Do not drink alcohol. Do your regular daily activities at a slower pace than normal. Eat soft foods that are easy to digest. Take over-the-counter and prescription medicines only as told by your health care provider. Keep all follow-up visits as told by your health care provider. This is important. Contact a health care provider if: You have blood in your stool 2-3 days after the procedure. Get help right away if you have: More than a small spotting of blood in your stool. Large blood clots in your stool. Swelling of your abdomen. Nausea or vomiting. A fever. Increasing pain in your abdomen that is not relieved with medicine. Summary After the procedure, it is common to have a small amount of blood in your stool. You may also have mild cramping and bloating of your abdomen. If you were given a sedative during the procedure, it can affect you for several hours. Do not drive or operate machinery until your health care provider says that it is safe. Get help right away if you have a lot of blood in your stool, nausea or vomiting, a fever, or increased pain in your abdomen. This information is not intended to replace advice given to you by your health care provider. Make sure you discuss any questions you have with your health care provider. Document Revised: 04/08/2019 Document Reviewed: 12/27/2018 Elsevier Patient Education  [place] After This sheet gives you information about how to care for yourself after your procedure. Your health care provider may also give you more specific instructions. If you have  problems or questions, contact your health care provider. What can I expect after the procedure? After the procedure, it is common to have: Tiredness. Forgetfulness about what happened after the procedure. Impaired judgment for important decisions. Nausea or vomiting. Some difficulty with balance. Follow these instructions at home: For the time period you were told by your health care provider:   Rest as needed. Do not participate in activities where you could fall or become injured. Do not drive or use machinery. Do not drink alcohol. Do not take sleeping pills or medicines that cause drowsiness. Do not make important decisions or sign legal documents. Do not take care of children on your own. Eating and drinking Follow the diet that is recommended by your health care provider. Drink enough fluid to keep your urine pale yellow. If you vomit: Drink water, juice, or soup when you can drink without vomiting. Make sure you have  little or no nausea before eating solid foods. General instructions Have a responsible adult stay with you for the time you are told. It is important to have someone help care for you until you are awake and alert. Take over-the-counter and prescription medicines only as told by your health care provider. If you have sleep apnea, surgery and certain medicines can increase your risk for breathing problems. Follow instructions from your health care provider about wearing your sleep device: Anytime you are sleeping, including during daytime naps. While taking prescription pain medicines, sleeping medicines, or medicines that make you drowsy. Avoid smoking. Keep all follow-up visits as told by your health care provider. This is important. Contact a health care provider if: You keep feeling nauseous or you keep vomiting. You feel light-headed. You are still sleepy or having trouble with balance after 24 hours. You develop a rash. You have a fever. You have  redness or swelling around the IV site. Get help right away if: You have trouble breathing. You have new-onset confusion at home. Summary For several hours after your procedure, you may feel tired. You may also be forgetful and have poor judgment. Have a responsible adult stay with you for the time you are told. It is important to have someone help care for you until you are awake and alert. Rest as told. Do not drive or operate machinery. Do not drink alcohol or take sleeping pills. Get help right away if you have trouble breathing, or if you suddenly become confused. This information is not intended to replace advice given to you by your health care provider. Make sure you discuss any questions you have with your health care provider. Document Revised: 02/16/2020 Document Reviewed: 05/05/2019 Elsevier Patient Education  2022 [employer].

## 2021-05-05 ENCOUNTER — Encounter (HOSPITAL_COMMUNITY): Payer: Self-pay

## 2021-05-05 ENCOUNTER — Emergency Department (HOSPITAL_COMMUNITY)
Admission: EM | Admit: 2021-05-05 | Discharge: 2021-05-05 | Disposition: A | Discharge status: Home / Self Care | Payer: Medicare Other | Attending: Emergency Medicine | Admitting: Emergency Medicine

## 2021-05-05 ENCOUNTER — Other Ambulatory Visit: Payer: Self-pay

## 2021-05-05 DIAGNOSIS — Z9012 Acquired absence of left breast and nipple: Secondary | ICD-10-CM | POA: Insufficient documentation

## 2021-05-05 DIAGNOSIS — L03115 Cellulitis of right lower limb: Secondary | ICD-10-CM | POA: Insufficient documentation

## 2021-05-05 DIAGNOSIS — R112 Nausea with vomiting, unspecified: Secondary | ICD-10-CM | POA: Insufficient documentation

## 2021-05-05 DIAGNOSIS — S8991XA Unspecified injury of right lower leg, initial encounter: Secondary | ICD-10-CM | POA: Diagnosis not present

## 2021-05-05 DIAGNOSIS — R000 Tachycardia, unspecified: Secondary | ICD-10-CM | POA: Insufficient documentation

## 2021-05-05 DIAGNOSIS — Z853 Personal history of malignant neoplasm of breast: Secondary | ICD-10-CM | POA: Diagnosis not present

## 2021-05-05 DIAGNOSIS — I10 Essential (primary) hypertension: Secondary | ICD-10-CM | POA: Diagnosis not present

## 2021-05-05 DIAGNOSIS — E119 Type 2 diabetes mellitus without complications: Secondary | ICD-10-CM | POA: Diagnosis not present

## 2021-05-05 DIAGNOSIS — Z79899 Other long term (current) drug therapy: Secondary | ICD-10-CM | POA: Insufficient documentation

## 2021-05-05 DIAGNOSIS — J45909 Unspecified asthma, uncomplicated: Secondary | ICD-10-CM | POA: Insufficient documentation

## 2021-05-05 DIAGNOSIS — D72829 Elevated white blood cell count, unspecified: Secondary | ICD-10-CM | POA: Insufficient documentation

## 2021-05-05 DIAGNOSIS — X58XXXA Exposure to other specified factors, initial encounter: Secondary | ICD-10-CM | POA: Insufficient documentation

## 2021-05-05 DIAGNOSIS — Z7984 Long term (current) use of oral hypoglycemic drugs: Secondary | ICD-10-CM | POA: Insufficient documentation

## 2021-05-05 LAB — CBC WITH DIFFERENTIAL/PLATELET
Abs Immature Granulocytes: 0.04 10*3/uL (ref 0.00–0.07)
Basophils Absolute: 0 10*3/uL (ref 0.0–0.1)
Basophils Relative: 0 %
Eosinophils Absolute: 0 10*3/uL (ref 0.0–0.5)
Eosinophils Relative: 0 %
HCT: 43.5 % (ref 36.0–46.0)
Hemoglobin: 14.9 g/dL (ref 12.0–15.0)
Immature Granulocytes: 0 %
Lymphocytes Relative: 20 %
Lymphs Abs: 2.9 10*3/uL (ref 0.7–4.0)
MCH: 32.1 pg (ref 26.0–34.0)
MCHC: 34.3 g/dL (ref 30.0–36.0)
MCV: 93.8 fL (ref 80.0–100.0)
Monocytes Absolute: 1.2 10*3/uL — ABNORMAL HIGH (ref 0.1–1.0)
Monocytes Relative: 9 %
Neutro Abs: 10.1 10*3/uL — ABNORMAL HIGH (ref 1.7–7.7)
Neutrophils Relative %: 71 %
Platelets: 133 10*3/uL — ABNORMAL LOW (ref 150–400)
RBC: 4.64 MIL/uL (ref 3.87–5.11)
RDW: 12.5 % (ref 11.5–15.5)
WBC: 14.3 10*3/uL — ABNORMAL HIGH (ref 4.0–10.5)
nRBC: 0 % (ref 0.0–0.2)

## 2021-05-05 LAB — COMPREHENSIVE METABOLIC PANEL
ALT: 28 U/L (ref 0–44)
AST: 25 U/L (ref 15–41)
Albumin: 3.6 g/dL (ref 3.5–5.0)
Alkaline Phosphatase: 56 U/L (ref 38–126)
Anion gap: 10 (ref 5–15)
BUN: 11 mg/dL (ref 8–23)
CO2: 23 mmol/L (ref 22–32)
Calcium: 9.1 mg/dL (ref 8.9–10.3)
Chloride: 103 mmol/L (ref 98–111)
Creatinine, Ser: 0.96 mg/dL (ref 0.44–1.00)
GFR, Estimated: >60 mL/min (ref >60)
Glucose, Bld: 233 mg/dL — ABNORMAL HIGH (ref 70–99)
Potassium: 3.9 mmol/L (ref 3.5–5.1)
Sodium: 136 mmol/L (ref 135–145)
Total Bilirubin: 1.2 mg/dL (ref 0.3–1.2)
Total Protein: 6.7 g/dL (ref 6.5–8.1)

## 2021-05-05 LAB — URINALYSIS, ROUTINE W REFLEX MICROSCOPIC
Bacteria, UA: NONE SEEN
Bilirubin Urine: NEGATIVE
Glucose, UA: 150 mg/dL — AB
Hgb urine dipstick: NEGATIVE
Ketones, ur: 5 mg/dL — AB
Leukocytes,Ua: NEGATIVE
Nitrite: NEGATIVE
Protein, ur: 30 mg/dL — AB
Specific Gravity, Urine: 1.023 (ref 1.005–1.030)
Squamous Epithelial / HPF: >50 — ABNORMAL HIGH (ref 0–5)
pH: 5 (ref 5.0–8.0)

## 2021-05-05 LAB — LACTIC ACID, PLASMA
Lactic Acid, Venous: 1.2 mmol/L (ref 0.5–1.9)
Lactic Acid, Venous: 1.2 mmol/L (ref 0.5–1.9)

## 2021-05-05 LAB — LIPASE, BLOOD: Lipase: 25 U/L (ref 11–51)

## 2021-05-05 MED ORDER — FENTANYL CITRATE PF 50 MCG/ML IJ SOSY
50.0000 ug | PREFILLED_SYRINGE | Freq: Once | INTRAMUSCULAR | Status: AC
Start: 2021-05-05 — End: 2021-05-05
  Administered 2021-05-05: 50 ug via INTRAVENOUS
  Filled 2021-05-05: qty 1

## 2021-05-05 MED ORDER — CEPHALEXIN 500 MG PO CAPS: 500.0000 mg | ORAL_CAPSULE | Freq: Four times a day (QID) | ORAL | 0 refills | Status: DC

## 2021-05-05 MED ORDER — CEFAZOLIN SODIUM-DEXTROSE 2-4 GM/100ML-% IV SOLN
2.0000 g | Freq: Once | INTRAVENOUS | Status: AC
  Administered 2021-05-05: 2 g via INTRAVENOUS
  Filled 2021-05-05: qty 100

## 2021-05-05 MED ORDER — ONDANSETRON HCL 4 MG PO TABS: 4.0000 mg | ORAL_TABLET | Freq: Three times a day (TID) | ORAL | 0 refills | Status: AC | PRN

## 2021-05-05 MED ORDER — ONDANSETRON HCL 4 MG/2ML IJ SOLN
4.0000 mg | Freq: Once | INTRAMUSCULAR | Status: AC
  Administered 2021-05-05: 4 mg via INTRAVENOUS
  Filled 2021-05-05: qty 2

## 2021-05-05 MED ORDER — SODIUM CHLORIDE 0.9 % IV BOLUS
1000.0000 mL | Freq: Once | INTRAVENOUS | Status: AC
  Administered 2021-05-05: 1000 mL via INTRAVENOUS

## 2021-05-05 NOTE — ED Provider Notes (Signed)
[facility] EMERGENCY DEPARTMENT Provider Note   CSN: [id number] Arrival date & time: 05/05/21  1049     History Chief Complaint  Patient presents with   Leg Pain   Leg Injury    [name] is a 65 y.o. female.  HPI  65 year old female with past medical history of HTN, HLD, CVA, DM presents to the emergency department with concern for right lower extremity swelling, redness and nausea/vomiting.  Patient states 2 days ago she noticed a small spot on the posterior right calf that was red and irritated.  Unclear if this was initially a bug bite or injury.  Over the last day it has progressed to a larger area of redness, warmth with increased swelling of the right calf.  She denies any foot discoloration/numbness or tingling.  She has had no fever but endorses fatigue and chills.  Overnight she has been experiencing nausea and nonbloody vomiting.  Denies any diarrhea.  Denies chest pain or shortness of breath, denies any history of DVT/PE.  Past Medical History:  Diagnosis Date   Anxiety    Asthma    Cancer ([place])    Left breast   Complication of anesthesia    pt states she is hard to wake up after anesthetic-"she was told this at [facility]."    Constipation    Constipation    DDD (degenerative disc disease), lumbar    Depression    situational   Diabetes mellitus    Type II   Dyspnea    Family history of adverse reaction to anesthesia    Brother- N/V   Fibromyalgia    Gait instability    GERD (gastroesophageal reflux disease)    Headache    History of blood transfusion    with childbirth   History of radiation therapy 12/01/16-01/08/17   left breast 50.4 Gy in 28 fractions   Hypertension    Pneumonia 2008   PONV (postoperative nausea and vomiting)    Skin cancer    Stroke [facility])    mini stroke 2008    Patient Active Problem List   Diagnosis Date Noted   Fall 09/12/2019   Gait abnormality 09/12/2019   DM type 2 with diabetic peripheral neuropathy  ([place]) 09/12/2019   Low back pain with sciatica 09/12/2019   Malignant neoplasm of upper-outer quadrant of left breast in female, estrogen receptor positive ([place]) 09/30/2016   Hx of colonic polyps 03/27/2016   Dysphagia 09/03/2011   Chest pain 01/02/2011   Edema 01/02/2011   WOUND INFECTION 10/20/2007   DEGENERATIVE [place] DISEASE 10/20/2007   LAMINECTOMY, LUMBAR, HX OF 10/20/2007    Past Surgical History:  Procedure Laterality Date   ABDOMINAL HYSTERECTOMY     Cervical- cancer cell   ANTERIOR CERVICAL DISCECTOMY  2007   BACK SURGERY  2009   Laminectomy   Back surgery 4 yrs ago  2008   BALLOON DILATION  10/02/2011   Procedure: BALLOON DILATION;  Surgeon: [name], MD;  [facility];  Service: Endoscopy;  Laterality: N/A;   BREAST LUMPECTOMY WITH RADIOACTIVE SEED AND SENTINEL LYMPH NODE BIOPSY Left 10/13/2016   Procedure: BREAST LUMPECTOMY WITH RADIOACTIVE SEED AND SENTINEL LYMPH NODE BIOPSY;  Surgeon: [name], MD;  Location: [address];  Service: General;  Laterality: Left;   CHOLECYSTECTOMY     COLONOSCOPY WITH PROPOFOL N/A 04/18/2016   Procedure: COLONOSCOPY WITH PROPOFOL;  Surgeon: [name], MD;  Location: [facility];  Service: Endoscopy;  Laterality:  N/A;  955   FOOT SURGERY     tried to remove bone spur- was unable- "just clean"   MALONEY DILATION  10/02/2011   Procedure: MALONEY DILATION;  Surgeon: [name], MD;  [facility];  Service: Endoscopy;  Laterality: N/A;   SAVORY DILATION  10/02/2011   Procedure: SAVORY DILATION;  Surgeon: [name], MD;  [facility];  Service: Endoscopy;  Laterality: N/A;     OB History   No obstetric history on file.     Family History  Problem Relation Age of Onset   Heart attack Mother    Lung cancer Father     Social History   Tobacco Use   Smoking status: Never   Smokeless tobacco: Never  Vaping Use   Vaping Use: Never used  Substance Use Topics   Alcohol use: Yes     Alcohol/week: 0.0 standard drinks    Comment: glass of wine- occasional   Drug use: Never    Home Medications Prior to Admission medications   Medication Sig Start Date End Date Taking? Authorizing Provider  acarbose (PRECOSE) 50 MG tablet Take 50 mg by mouth 2 (two) times daily. 03/29/21   [provider]  ALPRAZolam Duanne Moron) 0.5 MG tablet Take 0.25 mg by mouth at bedtime. Reports taking 0.5 tab QHS. 12/25/14   [provider]  atenolol (TENORMIN) 25 MG tablet Take 25 mg by mouth daily.  12/17/10   [provider]  atorvastatin (LIPITOR) 20 MG tablet Take 20 mg by mouth daily.    [provider]  Dulaglutide (TRULICITY) 3 WI/0.9BD SOPN Inject 3 mg into the skin once a week.    [provider]  Dulaglutide (TRULICITY) 4.5 ZH/2.9JM SOPN Inject 4.5 mg as directed once a week. Patient not taking: No sig reported 02/01/21   Brita Romp, NP  fluorometholone (FML) 0.1 % ophthalmic suspension Place 1 drop into both eyes 3 (three) times daily.    [provider]  gabapentin (NEURONTIN) 300 MG capsule Take 300 mg by mouth in the morning.    [provider]  glimepiride (AMARYL) 4 MG tablet Take 4 mg by mouth 2 (two) times daily.    [provider]  lidocaine (LIDODERM) 5 % Place 1 patch onto the skin daily as needed (pain). Remove & Discard patch within 12 hours or as directed by MD     [provider]  metFORMIN (GLUCOPHAGE) 500 MG tablet Take 500 mg by mouth 2 (two) times daily with a meal. 03/19/16   [provider]  OVER THE COUNTER MEDICATION Take 1 tablet by mouth daily as needed (cramps). Leg Cramps    [provider]  Oxycodone HCl 10 MG TABS Take 10 mg by mouth every 6 (six) hours as needed (pain).     [provider]  pantoprazole (PROTONIX) 40 MG tablet Take 40 mg by mouth daily.    [provider]  tamoxifen (NOLVADEX) 20 MG tablet TAKE ONE TABLET BY MOUTH EVERY MORNING  03/05/21   Magrinat, Virgie Dad, MD  venlafaxine XR (EFFEXOR-XR) 37.5 MG 24 hr capsule TAKE ONE CAPSULE BY MOUTH EVERY MORNING 03/05/21   Magrinat, Virgie Dad, MD    Allergies    Adhesive [tape], Elemental sulfur, Lyrica [pregabalin], Amoxicillin-pot clavulanate, Codeine, Penicillins, and Sulfonamide derivatives  Review of Systems   Review of Systems  Constitutional:  Positive for chills and fatigue. Negative for fever.  HENT:  Negative for congestion.  Eyes:  Negative for visual disturbance.  Respiratory:  Negative for shortness of breath.   Cardiovascular:  Negative for chest pain.  Gastrointestinal:  Positive for nausea and vomiting. Negative for abdominal pain and diarrhea.  Genitourinary:  Negative for dysuria.  Musculoskeletal:        RLE calf redness, warmth, swelling  Skin:  Negative for color change, pallor and wound.  Neurological:  Negative for weakness, numbness and headaches.   Physical Exam Updated Vital Signs BP 128/85 (BP Location: Right Arm)   Pulse (!) 113   Temp 98.7 F (37.1 C) (Oral)   Resp 14   Ht 5\' 3"  (1.6 m)   Wt 94.3 kg   SpO2 95%   BMI 36.85 kg/m   Physical Exam Vitals and nursing note reviewed.  Constitutional:      General: She is not in acute distress.    Appearance: Normal appearance.  HENT:     Head: Normocephalic.     Mouth/Throat:     Mouth: Mucous membranes are moist.  Cardiovascular:     Rate and Rhythm: Tachycardia present.  Pulmonary:     Effort: Pulmonary effort is normal. No respiratory distress.  Abdominal:     Palpations: Abdomen is soft.     Tenderness: There is no abdominal tenderness.  Musculoskeletal:     Comments: Approximately 5 x 5 inch circumferential area of swelling/redness on the posterior right calf that is warm to the touch, mild swelling of the right shin, equal palpable DP pulses, right foot is otherwise neurovascularly intact  Skin:    General: Skin is warm.  Neurological:     Mental Status: She is alert and  oriented to person, place, and time. Mental status is at baseline.  Psychiatric:        Mood and Affect: Mood normal.    ED Results / Procedures / Treatments   Labs (all labs ordered are listed, but only abnormal results are displayed) Labs Reviewed  CBC WITH DIFFERENTIAL/PLATELET  COMPREHENSIVE METABOLIC PANEL  LIPASE, BLOOD  URINALYSIS, ROUTINE W REFLEX MICROSCOPIC    EKG None  Radiology No results found.  Procedures Procedures   Medications Ordered in ED Medications  sodium chloride 0.9 % bolus 1,000 mL (has no administration in time range)  ondansetron (ZOFRAN) injection 4 mg (has no administration in time range)    ED Course  I have reviewed the triage vital signs and the nursing notes.  Pertinent labs & imaging results that were available during my care of the patient were reviewed by me and considered in my medical decision making (see chart for details).    MDM Rules/Calculators/A&P                           65 year old female presents emergency department with right posterior calf swelling, redness and warmth associated with nausea.  She is tachycardic on arrival, afebrile with otherwise stable vitals.  Complaining of mild nausea and pain in the right lower extremity.  This started suddenly over the last day, no history of DVT/PE.  No chest pain or shortness of breath.  Blood work shows a leukocytosis over 14.  The leg has a focal area of circular cellulitis on the posterior calf, very mild swelling of the lower extremity otherwise, vascularly intact.  This appears to be more consistent with cellulitis with the tachycardia and elevated white count.  Lactic is normal, no signs of severe sepsis.  After fluid hydration tachycardia has  resolved, patient feels improved.  Plan for IV dose of antibiotics here and discharged on oral.  She has upset stomach as an allergy listed to penicillins, this is a mild allergy if at all, I feel safe prescribing  cephalosporins.  Patient will return tomorrow morning to have an ultrasound of the lower extremity to rule out DVT although I feel that this is most likely infectious.  She will be treated as such with strict return to ED precautions.  On reevaluation patient is well-appearing, feels significantly improved, normal vitals.  Patient at this time appears safe and stable for discharge and will be treated as an outpatient.  Discharge plan and strict return to ED precautions discussed, patient verbalizes understanding and agreement.  Final Clinical Impression(s) / ED Diagnoses Final diagnoses:  None    Rx / DC Orders ED Discharge Orders     None        Lorelle Gibbs, DO 05/05/21 1505

## 2021-05-05 NOTE — ED Triage Notes (Signed)
Right posterior leg pain, redness, swelling sudden onset yesterday. Fall week prior and landed on right knee. Pt reports emesis all night yesterday evening. Reports sweats and chills last night.

## 2021-05-05 NOTE — Discharge Instructions (Signed)
You have been seen and discharged from the emergency department.  You appear to have a skin infection of the right lower leg.  You are given an IV dose of antibiotics here in the department, continue to take oral antibiotics as directed starting tomorrow, Monday, 05/06/2021.  Return tomorrow morning, Monday, 05/06/2021, to have an ultrasound of the right lower extremity to rule out blood clot.  Use nausea medicine as needed.  Stay well-hydrated.  Follow-up with your primary provider for reevaluation and further care. Take home medications as prescribed. If you have any worsening symptoms, high fevers, inability to tolerate oral medicine, worsening swelling/redness of the right lower extremity, chest pain/shortness of breath or further concerns for your health please return to an emergency department for further evaluation.

## 2021-05-06 ENCOUNTER — Encounter (HOSPITAL_COMMUNITY)
Admission: RE | Admit: 2021-05-06 | Discharge: 2021-05-06 | Disposition: A | Discharge status: Home / Self Care | Payer: Medicare Other | Source: Ambulatory Visit | Attending: Internal Medicine | Admitting: Internal Medicine

## 2021-05-07 ENCOUNTER — Other Ambulatory Visit: Payer: Self-pay

## 2021-05-07 ENCOUNTER — Ambulatory Visit: Payer: Medicare Other | Admitting: Nurse Practitioner

## 2021-05-07 ENCOUNTER — Ambulatory Visit (HOSPITAL_COMMUNITY)
Admission: RE | Admit: 2021-05-07 | Discharge: 2021-05-07 | Disposition: A | Discharge status: Home / Self Care | Payer: Medicare Other | Source: Ambulatory Visit | Attending: Emergency Medicine | Admitting: Emergency Medicine

## 2021-05-07 ENCOUNTER — Ambulatory Visit: Payer: Medicare Other | Admitting: Nutrition

## 2021-05-07 DIAGNOSIS — M7989 Other specified soft tissue disorders: Secondary | ICD-10-CM | POA: Diagnosis not present

## 2021-05-07 DIAGNOSIS — R600 Localized edema: Secondary | ICD-10-CM | POA: Insufficient documentation

## 2021-05-07 NOTE — Patient Instructions (Incomplete)

## 2021-05-08 ENCOUNTER — Encounter (HOSPITAL_COMMUNITY): Admission: RE | Payer: Self-pay | Source: Home / Self Care

## 2021-05-08 ENCOUNTER — Ambulatory Visit (HOSPITAL_COMMUNITY): Admission: RE | Admit: 2021-05-08 | Payer: Medicare Other | Source: Home / Self Care | Admitting: Internal Medicine

## 2021-05-08 SURGERY — COLONOSCOPY WITH PROPOFOL
Anesthesia: Monitor Anesthesia Care

## 2021-05-15 DIAGNOSIS — I129 Hypertensive chronic kidney disease with stage 1 through stage 4 chronic kidney disease, or unspecified chronic kidney disease: Secondary | ICD-10-CM | POA: Diagnosis not present

## 2021-05-15 DIAGNOSIS — E1122 Type 2 diabetes mellitus with diabetic chronic kidney disease: Secondary | ICD-10-CM | POA: Diagnosis not present

## 2021-05-15 DIAGNOSIS — E782 Mixed hyperlipidemia: Secondary | ICD-10-CM | POA: Diagnosis not present

## 2021-05-15 DIAGNOSIS — N1831 Chronic kidney disease, stage 3a: Secondary | ICD-10-CM | POA: Diagnosis not present

## 2021-05-28 ENCOUNTER — Other Ambulatory Visit (HOSPITAL_COMMUNITY): Payer: Self-pay | Admitting: Internal Medicine

## 2021-05-28 ENCOUNTER — Other Ambulatory Visit: Payer: Self-pay

## 2021-05-28 ENCOUNTER — Ambulatory Visit (HOSPITAL_COMMUNITY)
Admission: RE | Admit: 2021-05-28 | Discharge: 2021-05-28 | Disposition: A | Discharge status: Home / Self Care | Payer: Medicare Other | Source: Ambulatory Visit | Attending: Internal Medicine | Admitting: Internal Medicine

## 2021-05-28 DIAGNOSIS — S4992XA Unspecified injury of left shoulder and upper arm, initial encounter: Secondary | ICD-10-CM

## 2021-05-28 DIAGNOSIS — M21372 Foot drop, left foot: Secondary | ICD-10-CM | POA: Diagnosis not present

## 2021-05-28 DIAGNOSIS — I10 Essential (primary) hypertension: Secondary | ICD-10-CM | POA: Diagnosis not present

## 2021-05-28 DIAGNOSIS — E1140 Type 2 diabetes mellitus with diabetic neuropathy, unspecified: Secondary | ICD-10-CM | POA: Diagnosis not present

## 2021-05-28 DIAGNOSIS — G894 Chronic pain syndrome: Secondary | ICD-10-CM | POA: Diagnosis not present

## 2021-05-28 DIAGNOSIS — E063 Autoimmune thyroiditis: Secondary | ICD-10-CM | POA: Diagnosis not present

## 2021-06-03 DIAGNOSIS — M21372 Foot drop, left foot: Secondary | ICD-10-CM | POA: Diagnosis not present

## 2021-06-03 DIAGNOSIS — M79671 Pain in right foot: Secondary | ICD-10-CM | POA: Diagnosis not present

## 2021-06-05 ENCOUNTER — Other Ambulatory Visit (HOSPITAL_COMMUNITY): Payer: Self-pay | Admitting: Physician Assistant

## 2021-06-05 ENCOUNTER — Other Ambulatory Visit: Payer: Self-pay | Admitting: Physician Assistant

## 2021-06-05 DIAGNOSIS — M25512 Pain in left shoulder: Secondary | ICD-10-CM | POA: Diagnosis not present

## 2021-06-14 ENCOUNTER — Other Ambulatory Visit: Payer: Self-pay | Admitting: Oncology

## 2021-06-14 DIAGNOSIS — N1831 Chronic kidney disease, stage 3a: Secondary | ICD-10-CM | POA: Diagnosis not present

## 2021-06-14 DIAGNOSIS — E1122 Type 2 diabetes mellitus with diabetic chronic kidney disease: Secondary | ICD-10-CM | POA: Diagnosis not present

## 2021-06-14 DIAGNOSIS — I129 Hypertensive chronic kidney disease with stage 1 through stage 4 chronic kidney disease, or unspecified chronic kidney disease: Secondary | ICD-10-CM | POA: Diagnosis not present

## 2021-06-14 DIAGNOSIS — E782 Mixed hyperlipidemia: Secondary | ICD-10-CM | POA: Diagnosis not present

## 2021-06-18 ENCOUNTER — Other Ambulatory Visit: Payer: Self-pay | Admitting: Oncology

## 2021-06-18 NOTE — Telephone Encounter (Signed)
Per 02/21/2021 office note, refill is appropriate. Gardiner Rhyme, RN

## 2021-06-26 ENCOUNTER — Other Ambulatory Visit: Payer: Self-pay

## 2021-06-26 ENCOUNTER — Ambulatory Visit (HOSPITAL_COMMUNITY)
Admission: RE | Admit: 2021-06-26 | Discharge: 2021-06-26 | Disposition: A | Discharge status: Home / Self Care | Payer: Medicare Other | Source: Ambulatory Visit | Attending: Physician Assistant | Admitting: Physician Assistant

## 2021-06-26 DIAGNOSIS — M7582 Other shoulder lesions, left shoulder: Secondary | ICD-10-CM | POA: Diagnosis not present

## 2021-06-26 DIAGNOSIS — M75122 Complete rotator cuff tear or rupture of left shoulder, not specified as traumatic: Secondary | ICD-10-CM | POA: Diagnosis not present

## 2021-06-26 DIAGNOSIS — M25412 Effusion, left shoulder: Secondary | ICD-10-CM | POA: Diagnosis not present

## 2021-06-26 DIAGNOSIS — M25512 Pain in left shoulder: Secondary | ICD-10-CM | POA: Diagnosis not present

## 2021-06-28 DIAGNOSIS — N1831 Chronic kidney disease, stage 3a: Secondary | ICD-10-CM | POA: Diagnosis not present

## 2021-06-28 DIAGNOSIS — E1122 Type 2 diabetes mellitus with diabetic chronic kidney disease: Secondary | ICD-10-CM | POA: Diagnosis not present

## 2021-06-28 DIAGNOSIS — M5416 Radiculopathy, lumbar region: Secondary | ICD-10-CM | POA: Diagnosis not present

## 2021-06-28 DIAGNOSIS — E063 Autoimmune thyroiditis: Secondary | ICD-10-CM | POA: Diagnosis not present

## 2021-06-28 DIAGNOSIS — M25512 Pain in left shoulder: Secondary | ICD-10-CM | POA: Diagnosis not present

## 2021-06-28 DIAGNOSIS — I10 Essential (primary) hypertension: Secondary | ICD-10-CM | POA: Diagnosis not present

## 2021-06-28 DIAGNOSIS — Z0001 Encounter for general adult medical examination with abnormal findings: Secondary | ICD-10-CM | POA: Diagnosis not present

## 2021-06-28 DIAGNOSIS — E559 Vitamin D deficiency, unspecified: Secondary | ICD-10-CM | POA: Diagnosis not present

## 2021-06-28 DIAGNOSIS — E538 Deficiency of other specified B group vitamins: Secondary | ICD-10-CM | POA: Diagnosis not present

## 2021-06-28 DIAGNOSIS — E1140 Type 2 diabetes mellitus with diabetic neuropathy, unspecified: Secondary | ICD-10-CM | POA: Diagnosis not present

## 2021-06-28 DIAGNOSIS — G894 Chronic pain syndrome: Secondary | ICD-10-CM | POA: Diagnosis not present

## 2021-06-28 DIAGNOSIS — Z13220 Encounter for screening for lipoid disorders: Secondary | ICD-10-CM | POA: Diagnosis not present

## 2021-07-11 DIAGNOSIS — M21379 Foot drop, unspecified foot: Secondary | ICD-10-CM | POA: Diagnosis not present

## 2021-07-11 DIAGNOSIS — M75102 Unspecified rotator cuff tear or rupture of left shoulder, not specified as traumatic: Secondary | ICD-10-CM | POA: Diagnosis not present

## 2021-07-15 ENCOUNTER — Other Ambulatory Visit (HOSPITAL_COMMUNITY): Payer: Self-pay | Admitting: Physician Assistant

## 2021-07-15 DIAGNOSIS — N6489 Other specified disorders of breast: Secondary | ICD-10-CM

## 2021-07-16 DIAGNOSIS — E7849 Other hyperlipidemia: Secondary | ICD-10-CM | POA: Diagnosis not present

## 2021-07-16 DIAGNOSIS — I129 Hypertensive chronic kidney disease with stage 1 through stage 4 chronic kidney disease, or unspecified chronic kidney disease: Secondary | ICD-10-CM | POA: Diagnosis not present

## 2021-07-16 DIAGNOSIS — E1122 Type 2 diabetes mellitus with diabetic chronic kidney disease: Secondary | ICD-10-CM | POA: Diagnosis not present

## 2021-07-16 DIAGNOSIS — N1831 Chronic kidney disease, stage 3a: Secondary | ICD-10-CM | POA: Diagnosis not present

## 2021-07-29 DIAGNOSIS — G629 Polyneuropathy, unspecified: Secondary | ICD-10-CM | POA: Diagnosis not present

## 2021-07-29 DIAGNOSIS — E1129 Type 2 diabetes mellitus with other diabetic kidney complication: Secondary | ICD-10-CM | POA: Diagnosis not present

## 2021-07-29 DIAGNOSIS — E063 Autoimmune thyroiditis: Secondary | ICD-10-CM | POA: Diagnosis not present

## 2021-07-29 DIAGNOSIS — J40 Bronchitis, not specified as acute or chronic: Secondary | ICD-10-CM | POA: Diagnosis not present

## 2021-07-29 DIAGNOSIS — G894 Chronic pain syndrome: Secondary | ICD-10-CM | POA: Diagnosis not present

## 2021-07-29 DIAGNOSIS — I10 Essential (primary) hypertension: Secondary | ICD-10-CM | POA: Diagnosis not present

## 2021-07-30 ENCOUNTER — Ambulatory Visit (HOSPITAL_COMMUNITY)
Admission: RE | Admit: 2021-07-30 | Discharge: 2021-07-30 | Disposition: A | Discharge status: Home / Self Care | Payer: Medicare Other | Source: Ambulatory Visit | Attending: Physician Assistant | Admitting: Physician Assistant

## 2021-07-30 ENCOUNTER — Other Ambulatory Visit: Payer: Self-pay

## 2021-07-30 DIAGNOSIS — N6489 Other specified disorders of breast: Secondary | ICD-10-CM | POA: Diagnosis not present

## 2021-07-30 DIAGNOSIS — R922 Inconclusive mammogram: Secondary | ICD-10-CM | POA: Diagnosis not present

## 2021-08-07 DIAGNOSIS — M75102 Unspecified rotator cuff tear or rupture of left shoulder, not specified as traumatic: Secondary | ICD-10-CM | POA: Diagnosis not present

## 2021-08-07 DIAGNOSIS — M25512 Pain in left shoulder: Secondary | ICD-10-CM | POA: Diagnosis not present

## 2021-08-13 ENCOUNTER — Other Ambulatory Visit: Payer: Self-pay | Admitting: *Deleted

## 2021-08-13 DIAGNOSIS — E1122 Type 2 diabetes mellitus with diabetic chronic kidney disease: Secondary | ICD-10-CM | POA: Diagnosis not present

## 2021-08-13 DIAGNOSIS — E782 Mixed hyperlipidemia: Secondary | ICD-10-CM | POA: Diagnosis not present

## 2021-08-13 DIAGNOSIS — C50412 Malignant neoplasm of upper-outer quadrant of left female breast: Secondary | ICD-10-CM

## 2021-08-13 DIAGNOSIS — N1831 Chronic kidney disease, stage 3a: Secondary | ICD-10-CM | POA: Diagnosis not present

## 2021-08-13 DIAGNOSIS — I129 Hypertensive chronic kidney disease with stage 1 through stage 4 chronic kidney disease, or unspecified chronic kidney disease: Secondary | ICD-10-CM | POA: Diagnosis not present

## 2021-08-13 NOTE — Assessment & Plan Note (Signed)
status post left breast upper outer quadrant biopsy 09/23/2016 for a clinical T1b N0, sage IA t invasive ductal carcinoma, grade 1, estrogen and progesterone receptor positive, HER-2 not amplified, with an MIB-1 of 10%  (1) status post left lumpectomy and sentinel lymph node sampling 10/13/2016 for a pT1c pN0, stage IA invasive ductal carcinoma, grade 1, with negative margins.  (2) Oncotype DX score of 25 predicts a 10 year risk of recurrence outside the breast of 17%  adjuvant radiation6/18/18 - 01/08/17 Left Breast// 50.4 Gy in 28 fractions   (4) started anastrozole July 2018; discontinued September 2018 with side effects             (a) bone density 12/01/2016 shows a T score of 1.3 (normal).  (5) started tamoxifen 05/16/2017  Breast Cancer Surveillance: 1. Breast Exam: 08/14/21 Benign 2. Mammogram: 07/30/21: Benign  RTC in 1 year

## 2021-08-13 NOTE — Progress Notes (Incomplete)
? ?Patient Care Team: ?Redmond School, MD as PCP - General (Internal Medicine) ?[name], MD as Consulting Physician (General Surgery) ?Magrinat, Virgie Dad, MD (Inactive) as Consulting Physician (Oncology) ?[name], MD as Consulting Physician (Radiation Oncology) ?[name], MD as Consulting Physician (Gastroenterology) ?Gardenia Phlegm, NP as Nurse Practitioner (Hematology and Oncology) ? ?DIAGNOSIS: No diagnosis found. ? ?SUMMARY OF ONCOLOGIC HISTORY: ?Oncology History  ?Malignant neoplasm of upper-outer quadrant of left breast in female, estrogen receptor positive ([place])  ?09/23/2016 Initial Biopsy  ? Left breast upper outer quadrant biopsy: IDC, g1, ER+(100%), PR+(90%), Ki-67 10%, HER-2 negative (ratio 1.05).   ?  ?09/30/2016 Initial Diagnosis  ? Malignant neoplasm of upper-outer quadrant of left breast in female, estrogen receptor positive ([place]) ?  ?10/13/2016 Surgery  ? Left lumpectomy and SLNB: IDC, 1.1 cm, g 1, margins negative, 4 SLN negative, T1cN0 ?  ?10/13/2016 Oncotype testing  ? 25/17% intermediate risk ?  ?12/01/2016 - 01/08/2017 Radiation Therapy  ? Adjuvant radiation ([place]): Left Breast// 50.4 Gy in 28 fractions ?  ?02/2017 -  Anti-estrogen oral therapy  ? Anastrozole daily (bone density 11/2016 normal) ?  ? ? ?CHIEF COMPLIANT: Follow-up of estrogen receptor positive breast cancer ? ?INTERVAL HISTORY: [name] is a 66 y.o. with above-mentioned history of estrogen receptor positive breast cancer. Mammogram on 02/14/20223 showed no findings of malignancy in the left breast. She presents to the clinic today for follow-up.   ? ?ALLERGIES:  is allergic to adhesive [tape], elemental sulfur, lyrica [pregabalin], amoxicillin-pot clavulanate, codeine, penicillins, and sulfonamide derivatives. ? ?MEDICATIONS:  ?Current Outpatient Medications  ?Medication Sig Dispense Refill  ? acarbose (PRECOSE) 50 MG tablet Take 50 mg by mouth 2 (two) times daily.    ? ALPRAZolam (XANAX) 0.5  MG tablet Take 0.25 mg by mouth at bedtime. Reports taking 0.5 tab QHS.    ? atenolol (TENORMIN) 25 MG tablet Take 25 mg by mouth daily.     ? atorvastatin (LIPITOR) 20 MG tablet Take 20 mg by mouth daily.    ? cephALEXin (KEFLEX) 500 MG capsule Take 1 capsule (500 mg total) by mouth 4 (four) times daily. 20 capsule 0  ? Dulaglutide (TRULICITY) 3 RU/0.4VW SOPN Inject 3 mg into the skin once a week.    ? Dulaglutide (TRULICITY) 4.5 UJ/8.1XB SOPN Inject 4.5 mg as directed once a week. (Patient not taking: No sig reported) 6 mL 3  ? fluorometholone (FML) 0.1 % ophthalmic suspension Place 1 drop into both eyes 3 (three) times daily.    ? gabapentin (NEURONTIN) 300 MG capsule Take 300 mg by mouth in the morning.    ? glimepiride (AMARYL) 4 MG tablet Take 4 mg by mouth 2 (two) times daily.    ? lidocaine (LIDODERM) 5 % Place 1 patch onto the skin daily as needed (pain). Remove & Discard patch within 12 hours or as directed by MD     ? metFORMIN (GLUCOPHAGE) 500 MG tablet Take 500 mg by mouth 2 (two) times daily with a meal. (Patient not taking: Reported on 05/05/2021)    ? metFORMIN (GLUCOPHAGE-XR) 500 MG 24 hr tablet Take 500 mg by mouth 2 (two) times daily.    ? ondansetron (ZOFRAN) 4 MG tablet Take 1 tablet (4 mg total) by mouth every 8 (eight) hours as needed for nausea or vomiting. 4 tablet 0  ? OVER THE COUNTER MEDICATION Take 1 tablet by mouth daily as needed (cramps). Leg Cramps    ? Oxycodone HCl 10 MG TABS  Take 10 mg by mouth every 6 (six) hours as needed (pain).     ? pantoprazole (PROTONIX) 40 MG tablet Take 40 mg by mouth daily.    ? tamoxifen (NOLVADEX) 20 MG tablet TAKE ONE TABLET (20MG TOTAL) BY MOUTH EVERY MORNING 90 tablet 0  ? venlafaxine XR (EFFEXOR-XR) 37.5 MG 24 hr capsule TAKE ONE CAPSULE BY MOUTH EVERY MORNING 90 capsule 0  ? ?No current facility-administered medications for this visit.  ? ? ?PHYSICAL EXAMINATION: ?ECOG PERFORMANCE STATUS: {CHL ONC ECOG DT:[id number]} ? ?There were no vitals filed  for this visit. ?There were no vitals filed for this visit. ? ?BREAST:*** No palpable masses or nodules in either right or left breasts. No palpable axillary supraclavicular or infraclavicular adenopathy no breast tenderness or nipple discharge. (exam performed in the presence of a chaperone) ? ?LABORATORY DATA:  ?I have reviewed the data as listed ?CMP Latest Ref Rng & Units 05/05/2021 02/21/2021 12/07/2020  ?Glucose 70 - 99 mg/dL 233(H) 259(H) 206(H)  ?BUN 8 - 23 mg/dL _0 ?Creatinine 0.44 - 1.00 mg/dL 0.96 1.09(H) 1.00  ?Sodium 135 - 145 mmol/L 136 138 143  ?Potassium 3.5 - 5.1 mmol/L 3.9 4.6 4.4  ?Chloride 98 - 111 mmol/L 103 103 103  ?CO2 22 - 32 mmol/L _1 ?Calcium 8.9 - 10.3 mg/dL 9.1 9.4 9.5  ?Total Protein 6.5 - 8.1 g/dL 6.7 6.4(L) 6.0  ?Total Bilirubin 0.3 - 1.2 mg/dL 1.2 0.6 0.6  ?Alkaline Phos 38 - 126 U/L 56 66 71  ?AST 15 - 41 U/L 25 32 31  ?ALT 0 - 44 U/L 28 35 27  ? ? ?Lab Results  ?Component Value Date  ? WBC 14.3 (H) 05/05/2021  ? HGB 14.9 05/05/2021  ? HCT 43.5 05/05/2021  ? MCV 93.8 05/05/2021  ? PLT 133 (L) 05/05/2021  ? NEUTROABS 10.1 (H) 05/05/2021  ? ? ?ASSESSMENT & PLAN:  ?No problem-specific Assessment & Plan notes found for this encounter. ? ? ? ?No orders of the defined types were placed in this encounter. ? ?The patient has a good understanding of the overall plan. she agrees with it. she will call with any problems that may develop before the next visit here. ? ?Total time spent: *** mins including face to face time and time spent for planning, charting and coordination of care ? ?Rulon Eisenmenger, MD, MPH ?08/13/2021 ? ?I, Thana Ates, am acting as scribe for Dr. Nicholas Lose. ? ?{insert scribe attestation} ? ? ? ? ? ?

## 2021-08-14 ENCOUNTER — Telehealth: Payer: Self-pay | Admitting: Hematology and Oncology

## 2021-08-14 ENCOUNTER — Inpatient Hospital Stay (HOSPITAL_BASED_OUTPATIENT_CLINIC_OR_DEPARTMENT_OTHER): Payer: Medicare Other | Admitting: Hematology and Oncology

## 2021-08-14 ENCOUNTER — Inpatient Hospital Stay: Payer: Medicare Other | Admitting: Hematology and Oncology

## 2021-08-14 ENCOUNTER — Inpatient Hospital Stay: Payer: Medicare Other | Attending: Hematology and Oncology

## 2021-08-14 ENCOUNTER — Other Ambulatory Visit: Payer: Self-pay

## 2021-08-14 ENCOUNTER — Inpatient Hospital Stay: Payer: Medicare Other

## 2021-08-14 DIAGNOSIS — Z79810 Long term (current) use of selective estrogen receptor modulators (SERMs): Secondary | ICD-10-CM | POA: Diagnosis not present

## 2021-08-14 DIAGNOSIS — N951 Menopausal and female climacteric states: Secondary | ICD-10-CM | POA: Insufficient documentation

## 2021-08-14 DIAGNOSIS — C50412 Malignant neoplasm of upper-outer quadrant of left female breast: Secondary | ICD-10-CM

## 2021-08-14 DIAGNOSIS — Z923 Personal history of irradiation: Secondary | ICD-10-CM | POA: Insufficient documentation

## 2021-08-14 DIAGNOSIS — M21379 Foot drop, unspecified foot: Secondary | ICD-10-CM | POA: Diagnosis not present

## 2021-08-14 DIAGNOSIS — Z170 Estrogen receptor positive status [ER+]: Secondary | ICD-10-CM | POA: Insufficient documentation

## 2021-08-14 DIAGNOSIS — M25512 Pain in left shoulder: Secondary | ICD-10-CM | POA: Diagnosis not present

## 2021-08-14 DIAGNOSIS — M75102 Unspecified rotator cuff tear or rupture of left shoulder, not specified as traumatic: Secondary | ICD-10-CM | POA: Diagnosis not present

## 2021-08-14 LAB — CMP (CANCER CENTER ONLY)
ALT: 26 U/L (ref 0–44)
AST: 29 U/L (ref 15–41)
Albumin: 3.7 g/dL (ref 3.5–5.0)
Alkaline Phosphatase: 68 U/L (ref 38–126)
Anion gap: 6 (ref 5–15)
BUN: 12 mg/dL (ref 8–23)
CO2: 29 mmol/L (ref 22–32)
Calcium: 9.2 mg/dL (ref 8.9–10.3)
Chloride: 102 mmol/L (ref 98–111)
Creatinine: 1.08 mg/dL — ABNORMAL HIGH (ref 0.44–1.00)
GFR, Estimated: 58 mL/min — ABNORMAL LOW (ref >60)
Glucose, Bld: 386 mg/dL — ABNORMAL HIGH (ref 70–99)
Potassium: 4.4 mmol/L (ref 3.5–5.1)
Sodium: 137 mmol/L (ref 135–145)
Total Bilirubin: 0.7 mg/dL (ref 0.3–1.2)
Total Protein: 6.3 g/dL — ABNORMAL LOW (ref 6.5–8.1)

## 2021-08-14 LAB — CBC WITH DIFFERENTIAL (CANCER CENTER ONLY)
Abs Immature Granulocytes: 0.01 10*3/uL (ref 0.00–0.07)
Basophils Absolute: 0 10*3/uL (ref 0.0–0.1)
Basophils Relative: 1 %
Eosinophils Absolute: 0.2 10*3/uL (ref 0.0–0.5)
Eosinophils Relative: 3 %
HCT: 41.1 % (ref 36.0–46.0)
Hemoglobin: 14.1 g/dL (ref 12.0–15.0)
Immature Granulocytes: 0 %
Lymphocytes Relative: 42 %
Lymphs Abs: 2.7 10*3/uL (ref 0.7–4.0)
MCH: 31.5 pg (ref 26.0–34.0)
MCHC: 34.3 g/dL (ref 30.0–36.0)
MCV: 91.7 fL (ref 80.0–100.0)
Monocytes Absolute: 0.6 10*3/uL (ref 0.1–1.0)
Monocytes Relative: 10 %
Neutro Abs: 2.8 10*3/uL (ref 1.7–7.7)
Neutrophils Relative %: 44 %
Platelet Count: 143 10*3/uL — ABNORMAL LOW (ref 150–400)
RBC: 4.48 MIL/uL (ref 3.87–5.11)
RDW: 12.5 % (ref 11.5–15.5)
WBC Count: 6.3 10*3/uL (ref 4.0–10.5)
nRBC: 0 % (ref 0.0–0.2)

## 2021-08-14 MED ORDER — VENLAFAXINE HCL ER 37.5 MG PO CP24: ORAL_CAPSULE | ORAL | 3 refills | Status: DC

## 2021-08-14 MED ORDER — TAMOXIFEN CITRATE 20 MG PO TABS: ORAL_TABLET | ORAL | 3 refills | Status: DC

## 2021-08-14 NOTE — Progress Notes (Signed)
? ?Patient Care Team: ?Redmond School, MD as PCP - General (Internal Medicine) ?[name], MD as Consulting Physician (General Surgery) ?Magrinat, Virgie Dad, MD (Inactive) as Consulting Physician (Oncology) ?[name], MD as Consulting Physician (Radiation Oncology) ?[name], MD as Consulting Physician (Gastroenterology) ?Gardenia Phlegm, NP as Nurse Practitioner (Hematology and Oncology) ? ?DIAGNOSIS:  ?Encounter Diagnosis  ?Name Primary?  ? Malignant neoplasm of upper-outer quadrant of left breast in female, estrogen receptor positive ([place])   ? ? ?SUMMARY OF ONCOLOGIC HISTORY: ?Oncology History  ?Malignant neoplasm of upper-outer quadrant of left breast in female, estrogen receptor positive ([place])  ?09/23/2016 Initial Biopsy  ? Left breast upper outer quadrant biopsy: IDC, g1, ER+(100%), PR+(90%), Ki-67 10%, HER-2 negative (ratio 1.05).   ?  ?09/30/2016 Initial Diagnosis  ? Malignant neoplasm of upper-outer quadrant of left breast in female, estrogen receptor positive ([place]) ?  ?10/13/2016 Surgery  ? Left lumpectomy and SLNB: IDC, 1.1 cm, g 1, margins negative, 4 SLN negative, T1cN0 ?  ?10/13/2016 Oncotype testing  ? 25/17% intermediate risk ?  ?12/01/2016 - 01/08/2017 Radiation Therapy  ? Adjuvant radiation ([facility]): Left Breast// 50.4 Gy in 28 fractions ?  ?02/2017 -  Anti-estrogen oral therapy  ? Anastrozole daily (bone density 11/2016 normal), switched to tamoxifen December 2018 (plan for 10 years ?  ? ? ?CHIEF COMPLIANT: Follow-up on tamoxifen therapy and to establish oncology care ? ?INTERVAL HISTORY: [name] is a 66 year old lady with above-mentioned history of left breast cancer who underwent lumpectomy radiation and is currently on antiestrogen therapy with tamoxifen.   ?She is tolerating tamoxifen reasonably well.  She continues to have hot flashes which are well controlled with Effexor.  Over the past year she has been suffering from foot drop for which she is seeing  orthopedics and they fitted her with a foot drop brace.  She also has neuropathy due to diabetes.  She is walking with some difficulty currently because the brace is on order and she has not received it. ? ? ?ALLERGIES:  is allergic to adhesive [tape], elemental sulfur, lyrica [pregabalin], amoxicillin-pot clavulanate, codeine, penicillins, and sulfonamide derivatives. ? ?MEDICATIONS:  ?Current Outpatient Medications  ?Medication Sig Dispense Refill  ? acarbose (PRECOSE) 50 MG tablet Take 50 mg by mouth 2 (two) times daily.    ? ALPRAZolam (XANAX) 0.5 MG tablet Take 0.25 mg by mouth at bedtime. Reports taking 0.5 tab QHS.    ? atenolol (TENORMIN) 25 MG tablet Take 25 mg by mouth daily.     ? atorvastatin (LIPITOR) 20 MG tablet Take 20 mg by mouth daily.    ? Dulaglutide (TRULICITY) 3 VV/6.1YW SOPN Inject 3 mg into the skin once a week.    ? Dulaglutide (TRULICITY) 4.5 VP/7.1GG SOPN Inject 4.5 mg as directed once a week. (Patient not taking: No sig reported) 6 mL 3  ? fluorometholone (FML) 0.1 % ophthalmic suspension Place 1 drop into both eyes 3 (three) times daily.    ? gabapentin (NEURONTIN) 300 MG capsule Take 300 mg by mouth in the morning.    ? glimepiride (AMARYL) 4 MG tablet Take 4 mg by mouth 2 (two) times daily.    ? lidocaine (LIDODERM) 5 % Place 1 patch onto the skin daily as needed (pain). Remove & Discard patch within 12 hours or as directed by MD     ? metFORMIN (GLUCOPHAGE) 500 MG tablet Take 500 mg by mouth 2 (two) times daily with a meal. (Patient not taking: Reported on 05/05/2021)    ?  metFORMIN (GLUCOPHAGE-XR) 500 MG 24 hr tablet Take 500 mg by mouth 2 (two) times daily.    ? ondansetron (ZOFRAN) 4 MG tablet Take 1 tablet (4 mg total) by mouth every 8 (eight) hours as needed for nausea or vomiting. 4 tablet 0  ? OVER THE COUNTER MEDICATION Take 1 tablet by mouth daily as needed (cramps). Leg Cramps    ? Oxycodone HCl 10 MG TABS Take 10 mg by mouth every 6 (six) hours as needed (pain).     ?  pantoprazole (PROTONIX) 40 MG tablet Take 40 mg by mouth daily.    ? tamoxifen (NOLVADEX) 20 MG tablet TAKE ONE TABLET (20MG TOTAL) BY MOUTH EVERY MORNING 90 tablet 3  ? venlafaxine XR (EFFEXOR-XR) 37.5 MG 24 hr capsule TAKE ONE CAPSULE BY MOUTH EVERY MORNING 90 capsule 3  ? ?No current facility-administered medications for this visit.  ? ? ?PHYSICAL EXAMINATION: ?ECOG PERFORMANCE STATUS: 1 - Symptomatic but completely ambulatory ? ?Vitals:  ? 08/14/21 1108  ?BP: 129/87  ?Pulse: 85  ?Resp: 18  ?Temp: (!) 97.3 ?F (36.3 ?C)  ?SpO2: 99%  ? ?Filed Weights  ? 08/14/21 1108  ?Weight: 203 lb 14.4 oz (92.5 kg)  ? ? ?LABORATORY DATA:  ?I have reviewed the data as listed ?CMP Latest Ref Rng & Units 08/14/2021 05/05/2021 02/21/2021  ?Glucose 70 - 99 mg/dL 386(H) 233(H) 259(H)  ?BUN 8 - 23 mg/dL '12 11 20  ' ?Creatinine 0.44 - 1.00 mg/dL 1.08(H) 0.96 1.09(H)  ?Sodium 135 - 145 mmol/L 137 136 138  ?Potassium 3.5 - 5.1 mmol/L 4.4 3.9 4.6  ?Chloride 98 - 111 mmol/L 102 103 103  ?CO2 22 - 32 mmol/L '29 23 25  ' ?Calcium 8.9 - 10.3 mg/dL 9.2 9.1 9.4  ?Total Protein 6.5 - 8.1 g/dL 6.3(L) 6.7 6.4(L)  ?Total Bilirubin 0.3 - 1.2 mg/dL 0.7 1.2 0.6  ?Alkaline Phos 38 - 126 U/L 68 56 66  ?AST 15 - 41 U/L 29 25 32  ?ALT 0 - 44 U/L 26 28 35  ? ? ?Lab Results  ?Component Value Date  ? WBC 6.3 08/14/2021  ? HGB 14.1 08/14/2021  ? HCT 41.1 08/14/2021  ? MCV 91.7 08/14/2021  ? PLT 143 (L) 08/14/2021  ? NEUTROABS 2.8 08/14/2021  ? ? ?ASSESSMENT & PLAN:  ?Malignant neoplasm of upper-outer quadrant of left breast in female, estrogen receptor positive ([place]) ?status post left breast upper outer quadrant biopsy 09/23/2016 for a clinical T1b N0, sage IA t invasive ductal carcinoma, grade 1, estrogen and progesterone receptor positive, HER-2 not amplified, with an MIB-1 of 10% ?  ?(1) status post left lumpectomy and sentinel lymph node sampling 10/13/2016 for a pT1c pN0, stage IA invasive ductal carcinoma, grade 1, with negative margins. ?  ?(2) Oncotype DX score  of 25 predicts a 10 year risk of recurrence outside the breast of 17% ? ?adjuvant radiation6/18/18 - 01/08/17  ?Left Breast// 50.4 Gy in 28 fractions ?  ? (4) started anastrozole July 2018; discontinued September 2018 with side effects ?            (a) bone density 12/01/2016 shows a T score of 1.3 (normal). ?  ?(5) started tamoxifen 05/16/2017 plan of treatment is 10 years ?Tamoxifen toxicities: Hot flashes which are well controlled with Effexor ? ?Severe foot drop: Patient is working with orthopedics and is waiting for a foot brace. ?She likes to spend time with both of her sons who live in [place]. ? ?Breast Cancer Surveillance: ?1. Breast  Exam: 08/14/21 Benign ?2. Mammogram: 07/30/21: Benign ? ?RTC in 1 year ? ? ?No orders of the defined types were placed in this encounter. ? ?The patient has a good understanding of the overall plan. she agrees with it. she will call with any problems that may develop before the next visit here. ?Total time spent: 30 mins including face to face time and time spent for planning, charting and co-ordination of care ? ? Harriette Ohara, MD ?08/14/21 ? ? ? ?

## 2021-08-14 NOTE — Telephone Encounter (Signed)
Scheduled appointment per clinic. Patient aware. ?

## 2021-08-14 NOTE — Assessment & Plan Note (Signed)
status post left breast upper outer quadrant biopsy 09/23/2016 for a clinical T1b N0, sage IA t invasive ductal carcinoma, grade 1, estrogen and progesterone receptor positive, HER-2 not amplified, with an MIB-1 of 10% ?? ?(1) status post left lumpectomy and sentinel lymph node sampling 10/13/2016 for a pT1c pN0, stage IA invasive ductal carcinoma, grade 1, with negative margins. ?? ?(2) Oncotype DX score of 25 predicts a 10 year risk of recurrence outside the breast of 17% ? ?adjuvant radiation6/18/18 - 01/08/17? ?Left Breast// 50.4 Gy in 28 fractions ?? ? (4) started anastrozole July 2018; discontinued September 2018 with side effects ?            (a) bone density 12/01/2016 shows a T score of 1.3 (normal). ?? ?(5) started tamoxifen 05/16/2017 ? ?Breast Cancer Surveillance: ?1. Breast Exam: 08/14/21 Benign ?2. Mammogram: 07/30/21: Benign ? ?RTC in 1 year ?

## 2021-08-20 ENCOUNTER — Institutional Professional Consult (permissible substitution): Payer: Medicare Other | Admitting: Neurology

## 2021-08-20 ENCOUNTER — Encounter: Payer: Self-pay | Admitting: Neurology

## 2021-08-21 DIAGNOSIS — M75102 Unspecified rotator cuff tear or rupture of left shoulder, not specified as traumatic: Secondary | ICD-10-CM | POA: Diagnosis not present

## 2021-08-26 DIAGNOSIS — I10 Essential (primary) hypertension: Secondary | ICD-10-CM | POA: Diagnosis not present

## 2021-08-26 DIAGNOSIS — N1831 Chronic kidney disease, stage 3a: Secondary | ICD-10-CM | POA: Diagnosis not present

## 2021-08-26 DIAGNOSIS — G894 Chronic pain syndrome: Secondary | ICD-10-CM | POA: Diagnosis not present

## 2021-08-26 DIAGNOSIS — E063 Autoimmune thyroiditis: Secondary | ICD-10-CM | POA: Diagnosis not present

## 2021-08-26 DIAGNOSIS — E1140 Type 2 diabetes mellitus with diabetic neuropathy, unspecified: Secondary | ICD-10-CM | POA: Diagnosis not present

## 2021-08-28 DIAGNOSIS — M25512 Pain in left shoulder: Secondary | ICD-10-CM | POA: Diagnosis not present

## 2021-08-28 DIAGNOSIS — M75102 Unspecified rotator cuff tear or rupture of left shoulder, not specified as traumatic: Secondary | ICD-10-CM | POA: Diagnosis not present

## 2021-09-03 ENCOUNTER — Institutional Professional Consult (permissible substitution): Payer: Medicare Other | Admitting: Neurology

## 2021-09-04 DIAGNOSIS — M25512 Pain in left shoulder: Secondary | ICD-10-CM | POA: Diagnosis not present

## 2021-09-04 DIAGNOSIS — M75102 Unspecified rotator cuff tear or rupture of left shoulder, not specified as traumatic: Secondary | ICD-10-CM | POA: Diagnosis not present

## 2021-09-13 DIAGNOSIS — N1831 Chronic kidney disease, stage 3a: Secondary | ICD-10-CM | POA: Diagnosis not present

## 2021-09-13 DIAGNOSIS — E1122 Type 2 diabetes mellitus with diabetic chronic kidney disease: Secondary | ICD-10-CM | POA: Diagnosis not present

## 2021-09-13 DIAGNOSIS — I129 Hypertensive chronic kidney disease with stage 1 through stage 4 chronic kidney disease, or unspecified chronic kidney disease: Secondary | ICD-10-CM | POA: Diagnosis not present

## 2021-09-13 DIAGNOSIS — E782 Mixed hyperlipidemia: Secondary | ICD-10-CM | POA: Diagnosis not present

## 2021-09-25 DIAGNOSIS — M21372 Foot drop, left foot: Secondary | ICD-10-CM | POA: Diagnosis not present

## 2021-09-30 DIAGNOSIS — M5416 Radiculopathy, lumbar region: Secondary | ICD-10-CM | POA: Diagnosis not present

## 2021-09-30 DIAGNOSIS — M1991 Primary osteoarthritis, unspecified site: Secondary | ICD-10-CM | POA: Diagnosis not present

## 2021-09-30 DIAGNOSIS — I10 Essential (primary) hypertension: Secondary | ICD-10-CM | POA: Diagnosis not present

## 2021-09-30 DIAGNOSIS — G894 Chronic pain syndrome: Secondary | ICD-10-CM | POA: Diagnosis not present

## 2021-09-30 DIAGNOSIS — I129 Hypertensive chronic kidney disease with stage 1 through stage 4 chronic kidney disease, or unspecified chronic kidney disease: Secondary | ICD-10-CM | POA: Diagnosis not present

## 2021-10-02 ENCOUNTER — Ambulatory Visit: Payer: Medicare Other | Admitting: Neurology

## 2021-10-02 ENCOUNTER — Encounter: Payer: Self-pay | Admitting: Neurology

## 2021-10-02 VITALS — BP 123/75 | HR 80 | Ht 63.0 in | Wt 209.0 lb

## 2021-10-02 DIAGNOSIS — R269 Unspecified abnormalities of gait and mobility: Secondary | ICD-10-CM | POA: Diagnosis not present

## 2021-10-02 DIAGNOSIS — M21372 Foot drop, left foot: Secondary | ICD-10-CM | POA: Diagnosis not present

## 2021-10-02 DIAGNOSIS — E1142 Type 2 diabetes mellitus with diabetic polyneuropathy: Secondary | ICD-10-CM | POA: Diagnosis not present

## 2021-10-02 MED ORDER — DULOXETINE HCL 30 MG PO CPEP: 30.0000 mg | ORAL_CAPSULE | Freq: Every day | ORAL | 11 refills | Status: DC

## 2021-10-02 NOTE — Progress Notes (Signed)
? ?Chief Complaint  ?Patient presents with  ? Consult  ?  Room 14 - alone. Referred back for worsening of left foot drop. Currently, wearing AFO brace. Three falls since October. Neuropathy more bothersome (right side more than left).   ? ?ASSESSMENT AND PLAN ? ?[name] is a 66 y.o. female   ?Bilateral foot pain ? History of osteopenia, vitamin D deficiency, ? X-ray of left foot to rule out stress fracture ? Add on Cymbalta 30 mg daily, may titrating to 60 mg daily if she tolerate and benefit from the Cymbalta ? ?Left foot drop ? Due to combination of diabetic peripheral neuropathy, residual deficit from previous lumbar radiculopathy, status post decompression ? ?Gait abnormality ? Emphasized importance of moderate exercise, will benefit water aerobic ? ? ?DIAGNOSTIC DATA (LABS, IMAGING, TESTING) ?- I reviewed patient records, labs, notes, testing and imaging myself where available. ? ? ?MEDICAL HISTORY: ? ?[name], is a 66 year old female seen in request by Dr. Stann Mainland, [facility] [place], for evaluation of bilateral foot pain, left foot drop, her primary care physician is Dr. Redmond School, ? ?I reviewed and summarized the referring note.PMHX ?DM ?HLD ?HLD ?Left breast cancer, status post lobectomy followed by radiation therapy, ?Morbid obesity,  ?Multiple Lumbar decompression surgery in 2009, continue to complains of low back pain, knee pain, unsteady gait ?  ?She began to notice slow worsening low back pain prior to 2009, radiating pain to bilateral lower extremities, eventually had lumbar decompression surgery in 2009 without helping her symptoms much, she continued to have an low back pain, for a while, was on large dose of opiates, currently still taking up to 4 tablets a day, she noticed gradually decline functional ability, gait abnormality, rely on the cart well to the grocery shopping,also continued low back pain, use oxycodone '10mg'$  ?  ?She also has left knee pain, had left arthroscopic knee  surgery, fell again recently, suffered left shoulder injury, pending treatment plan, the side she has left breast cancer surgery and lymph node dissection. ? ?She does have history of diabetic peripheral neuropathy, now numbness to bilateral ankle level ? ?Electrodiagnostic study in May 2021, which showed evidence of chronic bilateral lumbosacral radiculopathy, left worse than right, left side mainly involving left L4, 5 S1, mild degree of right L4 radiculopathy ?  ?I personally reviewed MRI lumbar in April 2021, stable changes of posterior fusion from L2-L5 with prominent disc and facet degenerative changes at level above surgery, L1-L2, with mild posterior canal bilateral foraminal narrowing, and at L5-S1, bilateral foraminal narrowing due to facet hypertrophy. ? ?Laboratory evaluation in 2023, normal CBC hemoglobin of 14.1, CMP showed elevated glucose 386, creatinine 1.08, lactic acid 1.2, A1c in August 2022 was 8.1, ?  ?She has left AFO now, which has improved her gait, she still occasionally, in January 2023, she fell on her left shoulder, suffered left shoulder injury, ? ?She also complains of worsening bilateral lower extremity deep achy pain, especially with deep palpitation, increased left medial metatarsal pain,want to rule out treatable cause ? ? ?PHYSICAL EXAM: ?  ?Vitals:  ? 10/02/21 0706  ?BP: 123/75  ?Pulse: 80  ?Weight: 209 lb (94.8 kg)  ?Height: '5\' 3"'$  (1.6 m)  ? ?Body mass index is 37.02 kg/m?. ? ?PHYSICAL EXAMNIATION: ? ?Gen: NAD, conversant, well nourised, well groomed                     ?Cardiovascular: Regular rate rhythm, no peripheral edema, warm, nontender. ?Eyes:  Conjunctivae clear without exudates or hemorrhage ?Neck: Supple, no carotid bruits. ?Pulmonary: Clear to auscultation bilaterally  ? ?NEUROLOGICAL EXAM: ? ?MENTAL STATUS: ?Speech/cognition: ?Awake, alert, oriented to history taking and casual conversation ?  ?CRANIAL NERVES: ?CN II: Visual fields are full to confrontation. Pupils  are round equal and briskly reactive to light. ?CN III, IV, VI: extraocular movement are normal. No ptosis. ?CN V: Facial sensation is intact to light touch ?CN VII: Face is symmetric with normal eye closure  ?CN VIII: Hearing is normal to causal conversation. ?CN IX, X: Phonation is normal. ?CN XI: Head turning and shoulder shrug are intact ? ?MOTOR: Limited range of motion of left shoulder, felt there was no bilateral upper extremity proximal and distal muscle weakness, bilateral lower extremity proximal muscle strength is normal, mild right toe extension weakness, no significant movement at left ankle dorsiflexion, left plantarflexion 3 ? ?REFLEXES: ?Reflexes are absent ? ?SENSORY: Length-dependent sensory changes to mid shin level ? ?COORDINATION: ?There is no trunk or limb dysmetria noted. ? ?GAIT/STANCE: Need push-up to get up from seated position, wear left ankle brace, unsteady gait, ? ?REVIEW OF SYSTEMS:  ?Full 14 system review of systems performed and notable only for as above ?All other review of systems were negative. ? ? ?ALLERGIES: ?Allergies  ?Allergen Reactions  ? Adhesive [Tape] Other (See Comments)  ?  Tears skin  ? Elemental Sulfur Other (See Comments)  ?  Turns very red  ? Lyrica [Pregabalin] Swelling  ? Amoxicillin-Pot Clavulanate Nausea And Vomiting  ? Codeine Itching  ? Penicillins Other (See Comments)  ?  Upset stomach  ? Sulfonamide Derivatives Other (See Comments)  ?  Burning sensation  ? ? ?HOME MEDICATIONS: ?Current Outpatient Medications  ?Medication Sig Dispense Refill  ? acarbose (PRECOSE) 50 MG tablet Take 50 mg by mouth 2 (two) times daily.    ? ALPRAZolam (XANAX) 0.5 MG tablet Take 0.25 mg by mouth at bedtime. Reports taking 0.5 tab QHS.    ? atenolol (TENORMIN) 25 MG tablet Take 25 mg by mouth daily.     ? atorvastatin (LIPITOR) 20 MG tablet Take 20 mg by mouth daily.    ? Dulaglutide (TRULICITY) 3 NI/7.7OE SOPN Inject 3 mg into the skin once a week.    ? Dulaglutide (TRULICITY)  4.5 UM/3.5TI SOPN Inject 4.5 mg as directed once a week. 6 mL 3  ? fluorometholone (FML) 0.1 % ophthalmic suspension Place 1 drop into both eyes 3 (three) times daily.    ? gabapentin (NEURONTIN) 300 MG capsule Take 300 mg by mouth in the morning.    ? glimepiride (AMARYL) 4 MG tablet Take 4 mg by mouth 2 (two) times daily.    ? lidocaine (LIDODERM) 5 % Place 1 patch onto the skin daily as needed (pain). Remove & Discard patch within 12 hours or as directed by MD     ? metFORMIN (GLUCOPHAGE) 500 MG tablet Take 500 mg by mouth 2 (two) times daily with a meal.    ? metFORMIN (GLUCOPHAGE-XR) 500 MG 24 hr tablet Take 500 mg by mouth 2 (two) times daily.    ? ondansetron (ZOFRAN) 4 MG tablet Take 1 tablet (4 mg total) by mouth every 8 (eight) hours as needed for nausea or vomiting. 4 tablet 0  ? OVER THE COUNTER MEDICATION Take 1 tablet by mouth daily as needed (cramps). Leg Cramps    ? Oxycodone HCl 10 MG TABS Take 10 mg by mouth every 6 (six) hours as needed (pain).     ?  pantoprazole (PROTONIX) 40 MG tablet Take 40 mg by mouth daily.    ? tamoxifen (NOLVADEX) 20 MG tablet TAKE ONE TABLET ('20MG'$  TOTAL) BY MOUTH EVERY MORNING 90 tablet 3  ? venlafaxine XR (EFFEXOR-XR) 37.5 MG 24 hr capsule TAKE ONE CAPSULE BY MOUTH EVERY MORNING 90 capsule 3  ? ?No current facility-administered medications for this visit.  ? ? ?PAST MEDICAL HISTORY: ?Past Medical History:  ?Diagnosis Date  ? Anxiety   ? Asthma   ? Cancer [facility])   ? Left breast  ? Complication of anesthesia   ? pt states she is hard to wake up after anesthetic-"she was told this at [facility]."   ? Constipation   ? Constipation   ? DDD (degenerative disc disease), lumbar   ? Depression   ? situational  ? Diabetes mellitus   ? Type II  ? Dyspnea   ? Family history of adverse reaction to anesthesia   ? Brother- N/V  ? Fibromyalgia   ? Gait instability   ? GERD (gastroesophageal reflux disease)   ? Headache   ? History of blood transfusion   ? with childbirth  ?  History of radiation therapy 12/01/16-01/08/17  ? left breast 50.4 Gy in 28 fractions  ? Hypertension   ? Pneumonia 2008  ? PONV (postoperative nausea and vomiting)   ? Skin cancer   ? Stroke [facility])   ? mini s

## 2021-10-02 NOTE — Patient Instructions (Signed)
[facility]    Address: [address], [place], [place] [address]  Phone: [phone]   

## 2021-10-13 DIAGNOSIS — I129 Hypertensive chronic kidney disease with stage 1 through stage 4 chronic kidney disease, or unspecified chronic kidney disease: Secondary | ICD-10-CM | POA: Diagnosis not present

## 2021-10-13 DIAGNOSIS — E1122 Type 2 diabetes mellitus with diabetic chronic kidney disease: Secondary | ICD-10-CM | POA: Diagnosis not present

## 2021-10-13 DIAGNOSIS — N1831 Chronic kidney disease, stage 3a: Secondary | ICD-10-CM | POA: Diagnosis not present

## 2021-10-13 DIAGNOSIS — E782 Mixed hyperlipidemia: Secondary | ICD-10-CM | POA: Diagnosis not present

## 2021-10-25 DIAGNOSIS — E063 Autoimmune thyroiditis: Secondary | ICD-10-CM | POA: Diagnosis not present

## 2021-10-25 DIAGNOSIS — I10 Essential (primary) hypertension: Secondary | ICD-10-CM | POA: Diagnosis not present

## 2021-10-25 DIAGNOSIS — G894 Chronic pain syndrome: Secondary | ICD-10-CM | POA: Diagnosis not present

## 2021-10-25 DIAGNOSIS — E1140 Type 2 diabetes mellitus with diabetic neuropathy, unspecified: Secondary | ICD-10-CM | POA: Diagnosis not present

## 2021-11-05 ENCOUNTER — Encounter (INDEPENDENT_AMBULATORY_CARE_PROVIDER_SITE_OTHER): Payer: Self-pay | Admitting: *Deleted

## 2021-11-07 ENCOUNTER — Other Ambulatory Visit (INDEPENDENT_AMBULATORY_CARE_PROVIDER_SITE_OTHER): Payer: Self-pay

## 2021-11-07 ENCOUNTER — Encounter (INDEPENDENT_AMBULATORY_CARE_PROVIDER_SITE_OTHER): Payer: Self-pay

## 2021-11-07 ENCOUNTER — Telehealth (INDEPENDENT_AMBULATORY_CARE_PROVIDER_SITE_OTHER): Payer: Self-pay

## 2021-11-07 DIAGNOSIS — Z86010 Personal history of colonic polyps: Secondary | ICD-10-CM

## 2021-11-07 NOTE — Telephone Encounter (Signed)
Referring MD/PCP: Fusco   Procedure: tcs  Reason/Indication:  hx of colon polyps  Has patient had this procedure before?  Yes 2015  If so, when, by whom and where?    Is there a family history of colon cancer?  no  Who?  What age when diagnosed?    Is patient diabetic? If yes, Type 1 or Type 2   yes, type 2      Does patient have prosthetic heart valve or mechanical valve?  no  Do you have a pacemaker/defibrillator?  no  Has patient ever had endocarditis/atrial fibrillation? no  Does patient use oxygen? no  Has patient had joint replacement within last 12 months?  no  Is patient constipated or do they take laxatives? Yes, Miralax  Does patient have a history of alcohol/drug use?  no  Have you had a stroke/heart attack last 6 mths? no  Do you take medicine for weight loss?  no  For female patients,: have you had a hysterectomy yes                       are you post menopausal yes                       do you still have your menstrual cycle no   Is patient on blood thinner such as Coumadin, Plavix and/or Aspirin? No   Medications: acarbose 50 mg bid daily, atenolol 25 mg daily, atorvastatin 20 mg daily,gabapentin 300 mg bid prn, glimepiride 96mgd 2 daily, metformin 500 mg bid daily, pantoprazole 40 mg daily, tamoxifen 20 mg daily, venlafaxine 367.3mg daily, Trulicity once weekly, oxycodone 10 mg bid daily, meloxicam 15 mg daily  Allergies: Sulfur  Medication Adjustment per Dr RLaural Golden: hold metformin and glimiperide the evening prior and no diabetic medications the morning of procedure    Procedure date & time: 11/28/21 at 11:15

## 2021-11-13 DIAGNOSIS — I129 Hypertensive chronic kidney disease with stage 1 through stage 4 chronic kidney disease, or unspecified chronic kidney disease: Secondary | ICD-10-CM | POA: Diagnosis not present

## 2021-11-13 DIAGNOSIS — N1831 Chronic kidney disease, stage 3a: Secondary | ICD-10-CM | POA: Diagnosis not present

## 2021-11-13 DIAGNOSIS — E1122 Type 2 diabetes mellitus with diabetic chronic kidney disease: Secondary | ICD-10-CM | POA: Diagnosis not present

## 2021-11-13 DIAGNOSIS — E782 Mixed hyperlipidemia: Secondary | ICD-10-CM | POA: Diagnosis not present

## 2021-11-25 DIAGNOSIS — G894 Chronic pain syndrome: Secondary | ICD-10-CM | POA: Diagnosis not present

## 2021-11-25 DIAGNOSIS — E1165 Type 2 diabetes mellitus with hyperglycemia: Secondary | ICD-10-CM | POA: Diagnosis not present

## 2021-11-25 DIAGNOSIS — C50912 Malignant neoplasm of unspecified site of left female breast: Secondary | ICD-10-CM | POA: Diagnosis not present

## 2021-11-25 DIAGNOSIS — E063 Autoimmune thyroiditis: Secondary | ICD-10-CM | POA: Diagnosis not present

## 2021-11-28 ENCOUNTER — Other Ambulatory Visit: Payer: Self-pay

## 2021-11-28 ENCOUNTER — Ambulatory Visit (HOSPITAL_COMMUNITY)
Admission: RE | Admit: 2021-11-28 | Discharge: 2021-11-28 | Disposition: A | Discharge status: Home / Self Care | Payer: Medicare Other | Attending: Internal Medicine | Admitting: Internal Medicine

## 2021-11-28 ENCOUNTER — Encounter (HOSPITAL_COMMUNITY)
Admission: RE | Disposition: A | Discharge status: Home / Self Care | Payer: Self-pay | Source: Home / Self Care | Attending: Internal Medicine

## 2021-11-28 ENCOUNTER — Ambulatory Visit (HOSPITAL_BASED_OUTPATIENT_CLINIC_OR_DEPARTMENT_OTHER): Payer: Medicare Other | Admitting: Anesthesiology

## 2021-11-28 ENCOUNTER — Ambulatory Visit (HOSPITAL_COMMUNITY): Payer: Medicare Other | Admitting: Anesthesiology

## 2021-11-28 ENCOUNTER — Encounter (HOSPITAL_COMMUNITY): Payer: Self-pay | Admitting: Internal Medicine

## 2021-11-28 DIAGNOSIS — Z7984 Long term (current) use of oral hypoglycemic drugs: Secondary | ICD-10-CM | POA: Insufficient documentation

## 2021-11-28 DIAGNOSIS — D175 Benign lipomatous neoplasm of intra-abdominal organs: Secondary | ICD-10-CM | POA: Diagnosis not present

## 2021-11-28 DIAGNOSIS — I10 Essential (primary) hypertension: Secondary | ICD-10-CM | POA: Diagnosis not present

## 2021-11-28 DIAGNOSIS — E119 Type 2 diabetes mellitus without complications: Secondary | ICD-10-CM | POA: Insufficient documentation

## 2021-11-28 DIAGNOSIS — C50912 Malignant neoplasm of unspecified site of left female breast: Secondary | ICD-10-CM | POA: Insufficient documentation

## 2021-11-28 DIAGNOSIS — K6289 Other specified diseases of anus and rectum: Secondary | ICD-10-CM | POA: Insufficient documentation

## 2021-11-28 DIAGNOSIS — K644 Residual hemorrhoidal skin tags: Secondary | ICD-10-CM | POA: Insufficient documentation

## 2021-11-28 DIAGNOSIS — Z86010 Personal history of colonic polyps: Secondary | ICD-10-CM

## 2021-11-28 DIAGNOSIS — F419 Anxiety disorder, unspecified: Secondary | ICD-10-CM | POA: Insufficient documentation

## 2021-11-28 DIAGNOSIS — F32A Depression, unspecified: Secondary | ICD-10-CM | POA: Insufficient documentation

## 2021-11-28 DIAGNOSIS — Z8673 Personal history of transient ischemic attack (TIA), and cerebral infarction without residual deficits: Secondary | ICD-10-CM | POA: Insufficient documentation

## 2021-11-28 DIAGNOSIS — Z7985 Long-term (current) use of injectable non-insulin antidiabetic drugs: Secondary | ICD-10-CM | POA: Diagnosis not present

## 2021-11-28 DIAGNOSIS — G709 Myoneural disorder, unspecified: Secondary | ICD-10-CM | POA: Insufficient documentation

## 2021-11-28 DIAGNOSIS — J45909 Unspecified asthma, uncomplicated: Secondary | ICD-10-CM | POA: Insufficient documentation

## 2021-11-28 DIAGNOSIS — Z09 Encounter for follow-up examination after completed treatment for conditions other than malignant neoplasm: Secondary | ICD-10-CM | POA: Diagnosis not present

## 2021-11-28 DIAGNOSIS — Z1211 Encounter for screening for malignant neoplasm of colon: Secondary | ICD-10-CM | POA: Diagnosis not present

## 2021-11-28 DIAGNOSIS — K219 Gastro-esophageal reflux disease without esophagitis: Secondary | ICD-10-CM | POA: Insufficient documentation

## 2021-11-28 DIAGNOSIS — Z90710 Acquired absence of both cervix and uterus: Secondary | ICD-10-CM | POA: Insufficient documentation

## 2021-11-28 HISTORY — PX: COLONOSCOPY WITH PROPOFOL: SHX5780

## 2021-11-28 LAB — HM COLONOSCOPY

## 2021-11-28 LAB — GLUCOSE, CAPILLARY: Glucose-Capillary: 142 mg/dL — ABNORMAL HIGH (ref 70–99)

## 2021-11-28 SURGERY — COLONOSCOPY WITH PROPOFOL
Anesthesia: General

## 2021-11-28 MED ORDER — LIDOCAINE HCL (CARDIAC) PF 100 MG/5ML IV SOSY
PREFILLED_SYRINGE | INTRAVENOUS | Status: DC | PRN
  Administered 2021-11-28: 50 mg via INTRAVENOUS

## 2021-11-28 MED ORDER — PROPOFOL 10 MG/ML IV BOLUS
INTRAVENOUS | Status: DC | PRN
  Administered 2021-11-28: 100 mg via INTRAVENOUS

## 2021-11-28 MED ORDER — LACTATED RINGERS IV SOLN: INTRAVENOUS | Status: DC

## 2021-11-28 MED ORDER — PROPOFOL 500 MG/50ML IV EMUL
INTRAVENOUS | Status: DC | PRN
  Administered 2021-11-28: 150 ug/kg/min via INTRAVENOUS

## 2021-11-28 NOTE — Op Note (Signed)
[facility] Patient Name: [name] Procedure Date: 11/28/2021 10:36 AM MRN: [id number] Date of Birth: [birth year] Attending MD: Hildred Laser , MD CSN: [id number] Age: 66 Admit Type: Outpatient Procedure:                Colonoscopy Indications:              High risk colon cancer surveillance: Personal                            history of colonic polyps Providers:                Hildred Laser, MD, Lambert Mody, Caprice Kluver Referring MD:             Hildred Laser, MD Medicines:                Propofol per Anesthesia Complications:            No immediate complications. Estimated Blood Loss:     Estimated blood loss: none. Procedure:                Pre-Anesthesia Assessment:                           - Prior to the procedure, a History and Physical                            was performed, and patient medications and                            allergies were reviewed. The patient's tolerance of                            previous anesthesia was also reviewed. The risks                            and benefits of the procedure and the sedation                            options and risks were discussed with the patient.                            All questions were answered, and informed consent                            was obtained. Prior Anticoagulants: The patient has                            taken no previous anticoagulant or antiplatelet                            agents. ASA Grade Assessment: III - A patient with                            severe systemic disease. After reviewing the risks  and benefits, the patient was deemed in                            satisfactory condition to undergo the procedure.                           After obtaining informed consent, the colonoscope                            was passed under direct vision. Throughout the                            procedure, the patient's blood pressure, pulse, and                             oxygen saturations were monitored continuously. The                            [id number]-HQ190L ([id number]) scope was introduced through                            the anus and advanced to the the cecum, identified                            by appendiceal orifice and ileocecal valve. The                            colonoscopy was performed without difficulty. The                            patient tolerated the procedure well. The quality                            of the bowel preparation was good. The ileocecal                            valve, appendiceal orifice, and rectum were                            photographed. Scope In: 10:58:11 AM Scope Out: 11:18:10 AM Scope Withdrawal Time: 0 hours 9 minutes 4 seconds  Total Procedure Duration: 0 hours 19 minutes 59 seconds  Findings:      The perianal and digital rectal examinations were normal.      There was a small lipoma, in the ascending colon.      The exam was otherwise normal throughout the examined colon.      External hemorrhoids were found during retroflexion. The hemorrhoids       were small.      Anal papilla(e) were hypertrophied. Impression:               - Small lipoma in the ascending colon.                           - External hemorrhoids.                           -  Anal papilla(e) were hypertrophied.                           - No specimens collected. Moderate Sedation:      Per Anesthesia Care Recommendation:           - Patient has a contact number available for                            emergencies. The signs and symptoms of potential                            delayed complications were discussed with the                            patient. Return to normal activities tomorrow.                            Written discharge instructions were provided to the                            patient.                           - Resume previous diet today.                           - Continue present  medications.                           - Patient has a contact number available for                            emergencies. The signs and symptoms of potential                            delayed complications were discussed with the                            patient. Return to normal activities tomorrow.                            Written discharge instructions were provided to the                            patient.                           - Resume previous diet today.                           - Continue present medications.                           - Repeat colonoscopy in 5 years for surveillance. Procedure Code(s):        --- Professional ---  45378, Colonoscopy, flexible; diagnostic, including                            collection of specimen(s) by brushing or washing,                            when performed (separate procedure) Diagnosis Code(s):        --- Professional ---                           Z86.010, Personal history of colonic polyps                           D17.5, Benign lipomatous neoplasm of                            intra-abdominal organs                           K62.89, Other specified diseases of anus and rectum                           K64.4, Residual hemorrhoidal skin tags CPT copyright 2019 American Medical Association. All rights reserved. The codes documented in this report are preliminary and upon coder review may  be revised to meet current compliance requirements. Hildred Laser, MD Hildred Laser, MD 11/28/2021 11:24:58 AM This report has been signed electronically. Number of Addenda: 0

## 2021-11-28 NOTE — Anesthesia Preprocedure Evaluation (Signed)
Anesthesia Evaluation  Patient identified by MRN, date of birth, ID band Patient awake    Reviewed: Allergy & Precautions, H&P , NPO status , Patient's Chart, lab work & pertinent test results, reviewed documented beta blocker date and time   History of Anesthesia Complications (+) PONV, PROLONGED EMERGENCE and history of anesthetic complications  Airway Mallampati: II  TM Distance: >3 FB Neck ROM: full    Dental no notable dental hx.    Pulmonary shortness of breath, asthma ,    Pulmonary exam normal breath sounds clear to auscultation       Cardiovascular Exercise Tolerance: Good hypertension, negative cardio ROS   Rhythm:regular Rate:Normal     Neuro/Psych  Headaches, PSYCHIATRIC DISORDERS Anxiety Depression TIA Neuromuscular disease No Residual Symptoms    GI/Hepatic Neg liver ROS, GERD  Medicated,  Endo/Other  negative endocrine ROSdiabetes  Renal/GU negative Renal ROS  negative genitourinary   Musculoskeletal   Abdominal   Peds  Hematology negative hematology ROS (+)   Anesthesia Other Findings   Reproductive/Obstetrics negative OB ROS                             Anesthesia Physical Anesthesia Plan  ASA: 3  Anesthesia Plan: General   Post-op Pain Management:    Induction:   PONV Risk Score and Plan: Propofol infusion  Airway Management Planned:   Additional Equipment:   Intra-op Plan:   Post-operative Plan:   Informed Consent: I have reviewed the patients History and Physical, chart, labs and discussed the procedure including the risks, benefits and alternatives for the proposed anesthesia with the patient or authorized representative who has indicated his/her understanding and acceptance.     Dental Advisory Given  Plan Discussed with: CRNA  Anesthesia Plan Comments:         Anesthesia Quick Evaluation

## 2021-11-28 NOTE — Transfer of Care (Signed)
Immediate Anesthesia Transfer of Care Note  Patient: [name]  Procedure(s) Performed: COLONOSCOPY WITH PROPOFOL  Patient Location: Endoscopy Unit  Anesthesia Type:General  Level of Consciousness: Drowsy  Airway & Oxygen Therapy: Patient Spontanous Breathing  Post-op Assessment: Report given to RN and Post -op Vital signs reviewed and stable  Post vital signs: Reviewed and stable  Last Vitals:  Vitals Value Taken Time  BP    Temp    Pulse 95   Resp 15   SpO2 97%     Last Pain:  Vitals:   11/28/21 1053  TempSrc:   PainSc: 0-No pain      Patients Stated Pain Goal: 6 (12/14/14 0109)  Complications: No notable events documented.

## 2021-11-28 NOTE — Discharge Instructions (Signed)
Resume usual medications and diet as before. No driving for 24 hours. Next colonoscopy in 5 years. 

## 2021-11-28 NOTE — H&P (Signed)
[name] is an 66 y.o. female.   Chief Complaint: Patient is here for colonoscopy. HPI: Patient is 66 year old Caucasian female who has a history of colonic adenomas and is here for surveillance colonoscopy.  She had a 12 mm flat tubular adenoma removed from ileocecal valve in 2013.  She had 2 small adenomas removed in November 2017.  She denies abdominal pain change in bowel habits or rectal bleeding.  Patient states since I saw her last she was diagnosed with left breast cancer.  She remains in remission.  Personal history is also positive for cervical dysplasia and she went on to have hysterectomy. She does not take aspirin or anticoagulants. Family history is negative for colon cancer.  Past Medical History:  Diagnosis Date   Anxiety    Asthma    Cancer ([place])    Left breast   Complication of anesthesia    pt states she is hard to wake up after anesthetic-"she was told this at [facility]."    Constipation    Constipation    DDD (degenerative disc disease), lumbar    Depression    situational   Diabetes mellitus    Type II   Dyspnea    Family history of adverse reaction to anesthesia    Brother- N/V   Fibromyalgia    Gait instability    GERD (gastroesophageal reflux disease)    Headache    History of blood transfusion    with childbirth   History of radiation therapy 12/01/16-01/08/17   left breast 50.4 Gy in 28 fractions   Hypertension    Pneumonia 2008   PONV (postoperative nausea and vomiting)    Skin cancer    Stroke ([place])    mini stroke 2008    Past Surgical History:  Procedure Laterality Date   ABDOMINAL HYSTERECTOMY     Cervical- cancer cell   ANTERIOR CERVICAL DISCECTOMY  2007   BACK SURGERY  2009   Laminectomy   Back surgery 4 yrs ago  2008   BALLOON DILATION  10/02/2011   Procedure: BALLOON DILATION;  Surgeon: [name], MD;  [facility];  Service: Endoscopy;  Laterality: N/A;   BREAST LUMPECTOMY WITH RADIOACTIVE SEED AND  SENTINEL LYMPH NODE BIOPSY Left 10/13/2016   Procedure: BREAST LUMPECTOMY WITH RADIOACTIVE SEED AND SENTINEL LYMPH NODE BIOPSY;  Surgeon: [name], MD;  Location: [facility];  Service: General;  Laterality: Left;   CHOLECYSTECTOMY     COLONOSCOPY WITH PROPOFOL N/A 04/18/2016   Procedure: COLONOSCOPY WITH PROPOFOL;  Surgeon: [name], MD;  [facility]: [facility];  Service: Endoscopy;  Laterality: N/A;  955   FOOT SURGERY     tried to remove bone spur- was unable- "just clean"   MALONEY DILATION  10/02/2011   Procedure: MALONEY DILATION;  Surgeon: [name], MD;  [facility];  Service: Endoscopy;  Laterality: N/A;   SAVORY DILATION  10/02/2011   Procedure: SAVORY DILATION;  Surgeon: [name], MD;  [facility];  Service: Endoscopy;  Laterality: N/A;    Family History  Problem Relation Age of Onset   Heart attack Mother    Lung cancer Father    Social History:  reports that she has never smoked. She has never used smokeless tobacco. She reports current alcohol use. She reports that she does not use drugs.  Allergies:  Allergies  Allergen Reactions   Adhesive [Tape] Other (See Comments)    Tears skin  Lyrica [Pregabalin] Swelling   Augmentin [Amoxicillin-Pot Clavulanate] Nausea And Vomiting   Codeine Itching   Penicillins Other (See Comments)    Upset stomach   Sulfonamide Derivatives Other (See Comments)    Skin burning/tingling sensation, turn very red     Medications Prior to Admission  Medication Sig Dispense Refill   acarbose (PRECOSE) 50 MG tablet Take 50 mg by mouth 2 (two) times daily.     ALPRAZolam (XANAX) 0.5 MG tablet Take 0.5 mg by mouth at bedtime as needed for sleep or anxiety.     atenolol (TENORMIN) 25 MG tablet Take 25 mg by mouth daily.      atorvastatin (LIPITOR) 20 MG tablet Take 20 mg by mouth daily.     Dulaglutide (TRULICITY) 3 GL/8.7FI SOPN Inject 3 mg into the skin every Wednesday.     DULoxetine (CYMBALTA) 30  MG capsule Take 1 capsule (30 mg total) by mouth daily. (Patient taking differently: Take 30 mg by mouth daily as needed (nerve pain).) 30 capsule 11   gabapentin (NEURONTIN) 300 MG capsule Take 300 mg by mouth in the morning.     glimepiride (AMARYL) 4 MG tablet Take 4 mg by mouth daily with breakfast.     lidocaine (LIDODERM) 5 % Place 1 patch onto the skin daily as needed (pain). Remove & Discard patch within 12 hours or as directed by MD      metFORMIN (GLUCOPHAGE-XR) 500 MG 24 hr tablet Take 500 mg by mouth 2 (two) times daily.     Multiple Vitamins-Minerals (MULTIVITAMIN WITH MINERALS) tablet Take 1 tablet by mouth daily.     ondansetron (ZOFRAN) 4 MG tablet Take 1 tablet (4 mg total) by mouth every 8 (eight) hours as needed for nausea or vomiting. 4 tablet 0   OVER THE COUNTER MEDICATION Take 1 tablet by mouth daily as needed (cramps). Leg Cramps     Oxycodone HCl 10 MG TABS Take 5-10 mg by mouth 3 (three) times daily as needed (pain).     pantoprazole (PROTONIX) 40 MG tablet Take 40 mg by mouth daily.     tamoxifen (NOLVADEX) 20 MG tablet TAKE ONE TABLET ('20MG'$  TOTAL) BY MOUTH EVERY MORNING 90 tablet 3   venlafaxine XR (EFFEXOR-XR) 37.5 MG 24 hr capsule TAKE ONE CAPSULE BY MOUTH EVERY MORNING 90 capsule 3    Results for orders placed or performed during the hospital encounter of 11/28/21 (from the past 48 hour(s))  Glucose, capillary     Status: Abnormal   Collection Time: 11/28/21  9:55 AM  Result Value Ref Range   Glucose-Capillary 142 (H) 70 - 99 mg/dL    Comment: Glucose reference range applies only to samples taken after fasting for at least 8 hours.   No results found.  Review of Systems  Blood pressure (!) 137/59, pulse 92, temperature 98.7 F (37.1 C), temperature source Oral, resp. rate (!) 24, height '5\' 5"'$  (1.651 m), weight 90.3 kg, SpO2 98 %. Physical Exam HENT:     Mouth/Throat:     Mouth: Mucous membranes are moist.     Pharynx: Oropharynx is clear.  Eyes:      General: No scleral icterus.    Conjunctiva/sclera: Conjunctivae normal.  Cardiovascular:     Rate and Rhythm: Normal rate and regular rhythm.     Heart sounds: Normal heart sounds. No murmur heard. Pulmonary:     Effort: Pulmonary effort is normal.     Breath sounds: Normal breath sounds.  Abdominal:  General: There is no distension.     Palpations: Abdomen is soft. There is no mass.     Tenderness: There is no abdominal tenderness.     Comments: Lower midline and Pfannenstiel scars.  Musculoskeletal:     Cervical back: Neck supple.     Comments: Nonpitting pretibial edema.  Lymphadenopathy:     Cervical: No cervical adenopathy.  Skin:    General: Skin is warm and dry.  Neurological:     Mental Status: She is alert.      Assessment/Plan  History of colonic adenomas. Surveillance colonoscopy.   Hildred Laser, MD 11/28/2021, 10:47 AM

## 2021-11-29 NOTE — Anesthesia Postprocedure Evaluation (Signed)
Anesthesia Post Note  Patient: [name]  Procedure(s) Performed: COLONOSCOPY WITH PROPOFOL  Patient location during evaluation: Phase II Anesthesia Type: General Level of consciousness: awake Pain management: pain level controlled Vital Signs Assessment: post-procedure vital signs reviewed and stable Respiratory status: spontaneous breathing and respiratory function stable Cardiovascular status: blood pressure returned to baseline and stable Postop Assessment: no headache and no apparent nausea or vomiting Anesthetic complications: no Comments: Late entry   No notable events documented.   Last Vitals:  Vitals:   11/28/21 1120 11/28/21 1124  BP: (!) 90/49 (!) 101/58  Pulse: 94 88  Resp: 14 18  Temp: 36.7 C   SpO2: 96% 98%    Last Pain:  Vitals:   11/28/21 1124  TempSrc:   PainSc: 0-No pain                 Louann Sjogren

## 2021-12-03 ENCOUNTER — Encounter (INDEPENDENT_AMBULATORY_CARE_PROVIDER_SITE_OTHER): Payer: Self-pay | Admitting: *Deleted

## 2021-12-05 ENCOUNTER — Encounter (HOSPITAL_COMMUNITY): Payer: Self-pay | Admitting: Internal Medicine

## 2021-12-13 DIAGNOSIS — I129 Hypertensive chronic kidney disease with stage 1 through stage 4 chronic kidney disease, or unspecified chronic kidney disease: Secondary | ICD-10-CM | POA: Diagnosis not present

## 2021-12-13 DIAGNOSIS — E1122 Type 2 diabetes mellitus with diabetic chronic kidney disease: Secondary | ICD-10-CM | POA: Diagnosis not present

## 2021-12-13 DIAGNOSIS — N1831 Chronic kidney disease, stage 3a: Secondary | ICD-10-CM | POA: Diagnosis not present

## 2021-12-23 DIAGNOSIS — E1165 Type 2 diabetes mellitus with hyperglycemia: Secondary | ICD-10-CM | POA: Diagnosis not present

## 2021-12-23 DIAGNOSIS — C50912 Malignant neoplasm of unspecified site of left female breast: Secondary | ICD-10-CM | POA: Diagnosis not present

## 2021-12-23 DIAGNOSIS — E063 Autoimmune thyroiditis: Secondary | ICD-10-CM | POA: Diagnosis not present

## 2021-12-23 DIAGNOSIS — M1991 Primary osteoarthritis, unspecified site: Secondary | ICD-10-CM | POA: Diagnosis not present

## 2021-12-23 DIAGNOSIS — G894 Chronic pain syndrome: Secondary | ICD-10-CM | POA: Diagnosis not present

## 2022-02-13 DIAGNOSIS — N1831 Chronic kidney disease, stage 3a: Secondary | ICD-10-CM | POA: Diagnosis not present

## 2022-02-13 DIAGNOSIS — E1122 Type 2 diabetes mellitus with diabetic chronic kidney disease: Secondary | ICD-10-CM | POA: Diagnosis not present

## 2022-02-13 DIAGNOSIS — I129 Hypertensive chronic kidney disease with stage 1 through stage 4 chronic kidney disease, or unspecified chronic kidney disease: Secondary | ICD-10-CM | POA: Diagnosis not present

## 2022-02-20 ENCOUNTER — Telehealth: Payer: Self-pay | Admitting: *Deleted

## 2022-02-20 NOTE — Patient Outreach (Signed)
  Care Coordination   02/20/2022 Name: [name] MRN: [id number] DOB: 30-Sep-1955   Care Coordination Outreach Attempts:  An unsuccessful telephone outreach was attempted today to offer the patient information about available care coordination services as a benefit of their health plan.   Follow Up Plan:  Additional outreach attempts will be made to offer the patient care coordination information and services.   Encounter Outcome:  No Answer  Care Coordination Interventions Activated:  No   Care Coordination Interventions:  No, not indicated    Chong Sicilian, BSN, RN-BC [place] / [facility] Pharmacist, community Dial: [phone]

## 2022-02-21 DIAGNOSIS — I129 Hypertensive chronic kidney disease with stage 1 through stage 4 chronic kidney disease, or unspecified chronic kidney disease: Secondary | ICD-10-CM | POA: Diagnosis not present

## 2022-02-21 DIAGNOSIS — I10 Essential (primary) hypertension: Secondary | ICD-10-CM | POA: Diagnosis not present

## 2022-02-21 DIAGNOSIS — M5412 Radiculopathy, cervical region: Secondary | ICD-10-CM | POA: Diagnosis not present

## 2022-02-21 DIAGNOSIS — D485 Neoplasm of uncertain behavior of skin: Secondary | ICD-10-CM | POA: Diagnosis not present

## 2022-02-21 DIAGNOSIS — G629 Polyneuropathy, unspecified: Secondary | ICD-10-CM | POA: Diagnosis not present

## 2022-02-21 DIAGNOSIS — B353 Tinea pedis: Secondary | ICD-10-CM | POA: Diagnosis not present

## 2022-02-21 DIAGNOSIS — E1165 Type 2 diabetes mellitus with hyperglycemia: Secondary | ICD-10-CM | POA: Diagnosis not present

## 2022-02-21 DIAGNOSIS — M1991 Primary osteoarthritis, unspecified site: Secondary | ICD-10-CM | POA: Diagnosis not present

## 2022-02-21 DIAGNOSIS — G894 Chronic pain syndrome: Secondary | ICD-10-CM | POA: Diagnosis not present

## 2022-02-21 LAB — HEMOGLOBIN A1C: Hemoglobin A1C: 10.4

## 2022-03-15 DIAGNOSIS — E1122 Type 2 diabetes mellitus with diabetic chronic kidney disease: Secondary | ICD-10-CM | POA: Diagnosis not present

## 2022-03-15 DIAGNOSIS — I129 Hypertensive chronic kidney disease with stage 1 through stage 4 chronic kidney disease, or unspecified chronic kidney disease: Secondary | ICD-10-CM | POA: Diagnosis not present

## 2022-03-15 DIAGNOSIS — N1831 Chronic kidney disease, stage 3a: Secondary | ICD-10-CM | POA: Diagnosis not present

## 2022-03-17 ENCOUNTER — Other Ambulatory Visit (HOSPITAL_COMMUNITY): Payer: Self-pay | Admitting: Internal Medicine

## 2022-03-17 DIAGNOSIS — N6489 Other specified disorders of breast: Secondary | ICD-10-CM

## 2022-03-18 DIAGNOSIS — C44311 Basal cell carcinoma of skin of nose: Secondary | ICD-10-CM | POA: Diagnosis not present

## 2022-03-20 ENCOUNTER — Ambulatory Visit (HOSPITAL_COMMUNITY)
Admission: RE | Admit: 2022-03-20 | Discharge: 2022-03-20 | Disposition: A | Discharge status: Home / Self Care | Payer: Medicare Other | Source: Ambulatory Visit | Attending: Internal Medicine | Admitting: Internal Medicine

## 2022-03-20 DIAGNOSIS — N6489 Other specified disorders of breast: Secondary | ICD-10-CM | POA: Insufficient documentation

## 2022-03-21 DIAGNOSIS — M5412 Radiculopathy, cervical region: Secondary | ICD-10-CM | POA: Diagnosis not present

## 2022-03-21 DIAGNOSIS — I10 Essential (primary) hypertension: Secondary | ICD-10-CM | POA: Diagnosis not present

## 2022-03-21 DIAGNOSIS — G894 Chronic pain syndrome: Secondary | ICD-10-CM | POA: Diagnosis not present

## 2022-03-31 ENCOUNTER — Telehealth: Payer: Self-pay | Admitting: *Deleted

## 2022-03-31 ENCOUNTER — Encounter: Payer: Self-pay | Admitting: *Deleted

## 2022-03-31 NOTE — Patient Instructions (Signed)
Visit Information  Thank you for taking time to visit with me today. Please don't hesitate to contact me if I can be of assistance to you before our next scheduled telephone appointment.  Following are the goals we discussed today:  Continue monitoring blood sugars.  Call PCP office to obtain new glucose meter.  Our next appointment is by telephone on 11/10  Please call the care guide team at [phone] if you need to cancel or reschedule your appointment.   Please call the Suicide and Crisis Lifeline: 988 call the [place] National Suicide Prevention Lifeline: (323) 696-3677 or TTY: (413) 224-4146 TTY (762) 767-5386) to talk to a trained counselor call 1-800-273-TALK (toll free, 24 hour hotline) call the [facility]: [phone] call 911 if you are experiencing a Mental Health or [place] or need someone to talk to.  Patient verbalizes understanding of instructions and care plan provided today and agrees to view in [place]. Active MyChart status and patient understanding of how to access instructions and care plan via MyChart confirmed with patient.     The patient has been provided with contact information for the care management team and has been advised to call with any health related questions or concerns.   Valente David, RN, MSN, [place] Care Management Care Management Coordinator [phone]

## 2022-03-31 NOTE — Patient Outreach (Signed)
  Care Coordination   Initial Visit Note   03/31/2022 Name: [name] MRN: [id number] DOB: October 12, 1955  [name] is a 66 y.o. year old female who sees Redmond School, MD for primary care. I spoke with  [name] by phone today.  What matters to the patients health and wellness today?  Concerned about management of her diabetes.  A1C increased to 10.4, state she has a new skin cancer, diagnosis was stressful.  Feels she will need DM management by specialist, will speak to PCP regarding referral.  Glucose remains elevated in 300s.  Active with [facility] at PCP office.  Denies any urgent concerns, encouraged to contact this care manager with questions.      Goals Addressed               This Visit's Progress     Decrease A1C (currently 10.4) (pt-stated)        Care Coordination Interventions: Provided education to patient about basic DM disease process Reviewed medications with patient and discussed importance of medication adherence Counseled on importance of regular laboratory monitoring as prescribed Discussed plans with patient for ongoing care management follow up and provided patient with direct contact information for care management team Reviewed scheduled/upcoming provider appointments including: PCP  on 11/2 Assessed social determinant of health barriers Discussed requesting referral to endocrinology          SDOH assessments and interventions completed:  Yes  SDOH Interventions Today    Flowsheet Row Most Recent Value  SDOH Interventions   Food Insecurity Interventions Intervention Not Indicated  Housing Interventions Intervention Not Indicated  Transportation Interventions Intervention Not Indicated  Utilities Interventions Intervention Not Indicated        Care Coordination Interventions Activated:  Yes  Care Coordination Interventions:  Yes, provided   Follow up plan: Follow up call scheduled for 11/10    Encounter Outcome:  Pt.  Visit Completed   Valente David, RN, MSN, [place] Care Management Care Management Coordinator [phone]

## 2022-04-15 DIAGNOSIS — I129 Hypertensive chronic kidney disease with stage 1 through stage 4 chronic kidney disease, or unspecified chronic kidney disease: Secondary | ICD-10-CM | POA: Diagnosis not present

## 2022-04-15 DIAGNOSIS — E1122 Type 2 diabetes mellitus with diabetic chronic kidney disease: Secondary | ICD-10-CM | POA: Diagnosis not present

## 2022-04-15 DIAGNOSIS — N1831 Chronic kidney disease, stage 3a: Secondary | ICD-10-CM | POA: Diagnosis not present

## 2022-04-17 ENCOUNTER — Encounter: Payer: Self-pay | Admitting: "Endocrinology

## 2022-04-17 ENCOUNTER — Telehealth: Payer: Self-pay | Admitting: Nurse Practitioner

## 2022-04-17 ENCOUNTER — Other Ambulatory Visit: Payer: Self-pay | Admitting: Nurse Practitioner

## 2022-04-17 DIAGNOSIS — M5412 Radiculopathy, cervical region: Secondary | ICD-10-CM | POA: Diagnosis not present

## 2022-04-17 DIAGNOSIS — E559 Vitamin D deficiency, unspecified: Secondary | ICD-10-CM

## 2022-04-17 DIAGNOSIS — I10 Essential (primary) hypertension: Secondary | ICD-10-CM | POA: Diagnosis not present

## 2022-04-17 DIAGNOSIS — C44391 Other specified malignant neoplasm of skin of nose: Secondary | ICD-10-CM | POA: Diagnosis not present

## 2022-04-17 DIAGNOSIS — E119 Type 2 diabetes mellitus without complications: Secondary | ICD-10-CM

## 2022-04-17 DIAGNOSIS — I129 Hypertensive chronic kidney disease with stage 1 through stage 4 chronic kidney disease, or unspecified chronic kidney disease: Secondary | ICD-10-CM | POA: Diagnosis not present

## 2022-04-17 DIAGNOSIS — G894 Chronic pain syndrome: Secondary | ICD-10-CM | POA: Diagnosis not present

## 2022-04-17 NOTE — Telephone Encounter (Signed)
[facility] sent new referral for pt to be seen for uncontrollable DM.Called patient to go get updated labs for [place]. Left VM

## 2022-04-22 DIAGNOSIS — E119 Type 2 diabetes mellitus without complications: Secondary | ICD-10-CM | POA: Diagnosis not present

## 2022-04-22 DIAGNOSIS — E559 Vitamin D deficiency, unspecified: Secondary | ICD-10-CM | POA: Diagnosis not present

## 2022-04-23 LAB — VITAMIN D 25 HYDROXY (VIT D DEFICIENCY, FRACTURES): Vit D, 25-Hydroxy: 13.4 ng/mL — ABNORMAL LOW (ref 30.0–100.0)

## 2022-04-23 LAB — COMPREHENSIVE METABOLIC PANEL
ALT: 26 IU/L (ref 0–32)
AST: 33 IU/L (ref 0–40)
Albumin/Globulin Ratio: 2.1 (ref 1.2–2.2)
Albumin: 4.1 g/dL (ref 3.9–4.9)
Alkaline Phosphatase: 66 IU/L (ref 44–121)
BUN/Creatinine Ratio: 13 (ref 12–28)
BUN: 13 mg/dL (ref 8–27)
Bilirubin Total: 0.7 mg/dL (ref 0.0–1.2)
CO2: 24 mmol/L (ref 20–29)
Calcium: 9.3 mg/dL (ref 8.7–10.3)
Chloride: 105 mmol/L (ref 96–106)
Creatinine, Ser: 0.97 mg/dL (ref 0.57–1.00)
Globulin, Total: 2 g/dL (ref 1.5–4.5)
Glucose: 145 mg/dL — ABNORMAL HIGH (ref 70–99)
Potassium: 4.2 mmol/L (ref 3.5–5.2)
Sodium: 141 mmol/L (ref 134–144)
Total Protein: 6.1 g/dL (ref 6.0–8.5)
eGFR: 65 mL/min/{1.73_m2} (ref >59)

## 2022-04-23 LAB — LIPID PANEL
Chol/HDL Ratio: 1.8 ratio (ref 0.0–4.4)
Cholesterol, Total: 131 mg/dL (ref 100–199)
HDL: 73 mg/dL (ref >39)
LDL Chol Calc (NIH): 34 mg/dL (ref 0–99)
Triglycerides: 143 mg/dL (ref 0–149)
VLDL Cholesterol Cal: 24 mg/dL (ref 5–40)

## 2022-04-23 LAB — T4, FREE: Free T4: 1.14 ng/dL (ref 0.82–1.77)

## 2022-04-23 LAB — TSH: TSH: 0.75 u[IU]/mL (ref 0.450–4.500)

## 2022-04-24 ENCOUNTER — Ambulatory Visit: Payer: Self-pay | Admitting: *Deleted

## 2022-04-24 DIAGNOSIS — J069 Acute upper respiratory infection, unspecified: Secondary | ICD-10-CM | POA: Diagnosis not present

## 2022-04-24 NOTE — Patient Outreach (Signed)
  Care Coordination   04/24/2022 Name: [name] MRN: [id number] DOB: 27-Jan-1956   Care Coordination Outreach Attempts:  An unsuccessful telephone outreach was attempted for a scheduled appointment today.  Follow Up Plan:  Additional outreach attempts will be made to offer the patient care coordination information and services.   Encounter Outcome:  No Answer  Care Coordination Interventions Activated:  No   Care Coordination Interventions:  No, not indicated    Chong Sicilian, BSN, RN-BC RN Care Coordinator [place]: [phone] Main #: [phone]

## 2022-04-25 ENCOUNTER — Encounter: Payer: Medicare Other | Admitting: *Deleted

## 2022-04-29 DIAGNOSIS — L918 Other hypertrophic disorders of the skin: Secondary | ICD-10-CM | POA: Diagnosis not present

## 2022-04-29 DIAGNOSIS — Z08 Encounter for follow-up examination after completed treatment for malignant neoplasm: Secondary | ICD-10-CM | POA: Diagnosis not present

## 2022-04-29 DIAGNOSIS — Z85828 Personal history of other malignant neoplasm of skin: Secondary | ICD-10-CM | POA: Diagnosis not present

## 2022-05-04 ENCOUNTER — Encounter (INDEPENDENT_AMBULATORY_CARE_PROVIDER_SITE_OTHER): Payer: Self-pay | Admitting: Gastroenterology

## 2022-05-14 ENCOUNTER — Ambulatory Visit: Payer: Medicare Other | Admitting: "Endocrinology

## 2022-05-15 DIAGNOSIS — E7800 Pure hypercholesterolemia, unspecified: Secondary | ICD-10-CM | POA: Diagnosis not present

## 2022-05-15 DIAGNOSIS — E1165 Type 2 diabetes mellitus with hyperglycemia: Secondary | ICD-10-CM | POA: Diagnosis not present

## 2022-05-15 DIAGNOSIS — C44391 Other specified malignant neoplasm of skin of nose: Secondary | ICD-10-CM | POA: Diagnosis not present

## 2022-05-15 DIAGNOSIS — G629 Polyneuropathy, unspecified: Secondary | ICD-10-CM | POA: Diagnosis not present

## 2022-05-15 DIAGNOSIS — I129 Hypertensive chronic kidney disease with stage 1 through stage 4 chronic kidney disease, or unspecified chronic kidney disease: Secondary | ICD-10-CM | POA: Diagnosis not present

## 2022-05-15 DIAGNOSIS — G894 Chronic pain syndrome: Secondary | ICD-10-CM | POA: Diagnosis not present

## 2022-05-15 DIAGNOSIS — I10 Essential (primary) hypertension: Secondary | ICD-10-CM | POA: Diagnosis not present

## 2022-05-16 ENCOUNTER — Ambulatory Visit: Payer: Self-pay | Admitting: *Deleted

## 2022-05-16 NOTE — Patient Outreach (Signed)
  Care Coordination   05/16/2022 Name: [name] MRN: [id number] DOB: 1955-09-25   Care Coordination Outreach Attempts:  An unsuccessful telephone outreach was attempted for a scheduled appointment today. 3rd unsuccessful f/u attempt. Invalid home and cell number for patient and husband.  Follow Up Plan:  No further outreach attempts will be made at this time. We have been unable to contact the patient to offer or enroll patient in care coordination services  Encounter Outcome:  No Answer   Care Coordination Interventions:  No, not indicated    Chong Sicilian, BSN, RN-BC RN Care Coordinator [place]: [phone] Main #: [phone]

## 2022-05-27 DIAGNOSIS — M5412 Radiculopathy, cervical region: Secondary | ICD-10-CM | POA: Diagnosis not present

## 2022-05-27 DIAGNOSIS — I129 Hypertensive chronic kidney disease with stage 1 through stage 4 chronic kidney disease, or unspecified chronic kidney disease: Secondary | ICD-10-CM | POA: Diagnosis not present

## 2022-05-27 DIAGNOSIS — G894 Chronic pain syndrome: Secondary | ICD-10-CM | POA: Diagnosis not present

## 2022-05-27 DIAGNOSIS — E1165 Type 2 diabetes mellitus with hyperglycemia: Secondary | ICD-10-CM | POA: Diagnosis not present

## 2022-05-27 DIAGNOSIS — I10 Essential (primary) hypertension: Secondary | ICD-10-CM | POA: Diagnosis not present

## 2022-06-15 DIAGNOSIS — I129 Hypertensive chronic kidney disease with stage 1 through stage 4 chronic kidney disease, or unspecified chronic kidney disease: Secondary | ICD-10-CM | POA: Diagnosis not present

## 2022-06-15 DIAGNOSIS — E1122 Type 2 diabetes mellitus with diabetic chronic kidney disease: Secondary | ICD-10-CM | POA: Diagnosis not present

## 2022-06-15 DIAGNOSIS — N1831 Chronic kidney disease, stage 3a: Secondary | ICD-10-CM | POA: Diagnosis not present

## 2022-06-17 DIAGNOSIS — I10 Essential (primary) hypertension: Secondary | ICD-10-CM | POA: Diagnosis not present

## 2022-06-17 DIAGNOSIS — G894 Chronic pain syndrome: Secondary | ICD-10-CM | POA: Diagnosis not present

## 2022-06-17 DIAGNOSIS — C44391 Other specified malignant neoplasm of skin of nose: Secondary | ICD-10-CM | POA: Diagnosis not present

## 2022-06-17 DIAGNOSIS — E7800 Pure hypercholesterolemia, unspecified: Secondary | ICD-10-CM | POA: Diagnosis not present

## 2022-06-17 DIAGNOSIS — E1165 Type 2 diabetes mellitus with hyperglycemia: Secondary | ICD-10-CM | POA: Diagnosis not present

## 2022-06-17 DIAGNOSIS — I129 Hypertensive chronic kidney disease with stage 1 through stage 4 chronic kidney disease, or unspecified chronic kidney disease: Secondary | ICD-10-CM | POA: Diagnosis not present

## 2022-06-17 DIAGNOSIS — G629 Polyneuropathy, unspecified: Secondary | ICD-10-CM | POA: Diagnosis not present

## 2022-06-27 ENCOUNTER — Other Ambulatory Visit (HOSPITAL_COMMUNITY): Payer: Self-pay | Admitting: Internal Medicine

## 2022-06-27 ENCOUNTER — Ambulatory Visit (HOSPITAL_COMMUNITY)
Admission: RE | Admit: 2022-06-27 | Discharge: 2022-06-27 | Disposition: A | Discharge status: Home / Self Care | Payer: Medicare Other | Source: Ambulatory Visit | Attending: Internal Medicine | Admitting: Internal Medicine

## 2022-06-27 DIAGNOSIS — C50912 Malignant neoplasm of unspecified site of left female breast: Secondary | ICD-10-CM | POA: Diagnosis not present

## 2022-06-27 DIAGNOSIS — E559 Vitamin D deficiency, unspecified: Secondary | ICD-10-CM | POA: Diagnosis not present

## 2022-06-27 DIAGNOSIS — S2241XA Multiple fractures of ribs, right side, initial encounter for closed fracture: Secondary | ICD-10-CM | POA: Diagnosis not present

## 2022-06-27 DIAGNOSIS — N1831 Chronic kidney disease, stage 3a: Secondary | ICD-10-CM | POA: Diagnosis not present

## 2022-06-27 DIAGNOSIS — R0781 Pleurodynia: Secondary | ICD-10-CM

## 2022-06-27 DIAGNOSIS — R079 Chest pain, unspecified: Secondary | ICD-10-CM | POA: Diagnosis not present

## 2022-06-27 DIAGNOSIS — E1122 Type 2 diabetes mellitus with diabetic chronic kidney disease: Secondary | ICD-10-CM | POA: Diagnosis not present

## 2022-06-27 DIAGNOSIS — D518 Other vitamin B12 deficiency anemias: Secondary | ICD-10-CM | POA: Diagnosis not present

## 2022-06-27 DIAGNOSIS — E1140 Type 2 diabetes mellitus with diabetic neuropathy, unspecified: Secondary | ICD-10-CM | POA: Diagnosis not present

## 2022-06-27 DIAGNOSIS — E039 Hypothyroidism, unspecified: Secondary | ICD-10-CM | POA: Diagnosis not present

## 2022-06-27 DIAGNOSIS — Z0001 Encounter for general adult medical examination with abnormal findings: Secondary | ICD-10-CM | POA: Diagnosis not present

## 2022-06-27 DIAGNOSIS — E1165 Type 2 diabetes mellitus with hyperglycemia: Secondary | ICD-10-CM | POA: Diagnosis not present

## 2022-07-16 DIAGNOSIS — I129 Hypertensive chronic kidney disease with stage 1 through stage 4 chronic kidney disease, or unspecified chronic kidney disease: Secondary | ICD-10-CM | POA: Diagnosis not present

## 2022-07-16 DIAGNOSIS — E1122 Type 2 diabetes mellitus with diabetic chronic kidney disease: Secondary | ICD-10-CM | POA: Diagnosis not present

## 2022-07-16 DIAGNOSIS — N1831 Chronic kidney disease, stage 3a: Secondary | ICD-10-CM | POA: Diagnosis not present

## 2022-07-28 DIAGNOSIS — M5412 Radiculopathy, cervical region: Secondary | ICD-10-CM | POA: Diagnosis not present

## 2022-07-28 DIAGNOSIS — G894 Chronic pain syndrome: Secondary | ICD-10-CM | POA: Diagnosis not present

## 2022-07-28 DIAGNOSIS — G629 Polyneuropathy, unspecified: Secondary | ICD-10-CM | POA: Diagnosis not present

## 2022-07-28 DIAGNOSIS — E1122 Type 2 diabetes mellitus with diabetic chronic kidney disease: Secondary | ICD-10-CM | POA: Diagnosis not present

## 2022-07-28 DIAGNOSIS — E1129 Type 2 diabetes mellitus with other diabetic kidney complication: Secondary | ICD-10-CM | POA: Diagnosis not present

## 2022-07-28 DIAGNOSIS — N1831 Chronic kidney disease, stage 3a: Secondary | ICD-10-CM | POA: Diagnosis not present

## 2022-07-28 DIAGNOSIS — E1165 Type 2 diabetes mellitus with hyperglycemia: Secondary | ICD-10-CM | POA: Diagnosis not present

## 2022-07-28 DIAGNOSIS — E1140 Type 2 diabetes mellitus with diabetic neuropathy, unspecified: Secondary | ICD-10-CM | POA: Diagnosis not present

## 2022-07-28 DIAGNOSIS — C50912 Malignant neoplasm of unspecified site of left female breast: Secondary | ICD-10-CM | POA: Diagnosis not present

## 2022-08-14 ENCOUNTER — Encounter: Payer: Self-pay | Admitting: Radiology

## 2022-08-14 DIAGNOSIS — I129 Hypertensive chronic kidney disease with stage 1 through stage 4 chronic kidney disease, or unspecified chronic kidney disease: Secondary | ICD-10-CM | POA: Diagnosis not present

## 2022-08-14 DIAGNOSIS — E1122 Type 2 diabetes mellitus with diabetic chronic kidney disease: Secondary | ICD-10-CM | POA: Diagnosis not present

## 2022-08-14 DIAGNOSIS — N1831 Chronic kidney disease, stage 3a: Secondary | ICD-10-CM | POA: Diagnosis not present

## 2022-08-14 NOTE — Progress Notes (Signed)
Patient Care Team: Redmond School, MD as PCP - General (Internal Medicine) [name], MD as Consulting Physician (General Surgery) Magrinat, Virgie Dad, MD (Inactive) as Consulting Physician (Oncology) [name], MD as Consulting Physician (Radiation Oncology) [name], MD as Consulting Physician (Gastroenterology) [name] [name], NP as Nurse Practitioner (Hematology and Oncology)  DIAGNOSIS: No diagnosis found.  SUMMARY OF ONCOLOGIC HISTORY: Oncology History  Malignant neoplasm of upper-outer quadrant of left breast in female, estrogen receptor positive ([place])  09/23/2016 Initial Biopsy   Left breast upper outer quadrant biopsy: IDC, g1, ER+(100%), PR+(90%), Ki-67 10%, HER-2 negative (ratio 1.05).     09/30/2016 Initial Diagnosis   Malignant neoplasm of upper-outer quadrant of left breast in female, estrogen receptor positive ([place])   10/13/2016 Surgery   Left lumpectomy and SLNB: IDC, 1.1 cm, g 1, margins negative, 4 SLN negative, T1cN0   10/13/2016 Oncotype testing   25/17% intermediate risk   12/01/2016 - 01/08/2017 Radiation Therapy   Adjuvant radiation ([facility]): Left Breast// 50.4 Gy in 28 fractions   02/2017 -  Anti-estrogen oral therapy   Anastrozole daily (bone density 11/2016 normal), switched to tamoxifen December 2018 (plan for 10 years     CHIEF COMPLIANT: Follow-up on tamoxifen therapy   INTERVAL HISTORY: [name] is a 67 year old lady with above-mentioned history of left breast cancer who underwent lumpectomy radiation and is currently on antiestrogen therapy with tamoxifen.    ALLERGIES:  is allergic to adhesive [tape], lyrica [pregabalin], augmentin [amoxicillin-pot clavulanate], codeine, penicillins, and sulfonamide derivatives.  MEDICATIONS:  Current Outpatient Medications  Medication Sig Dispense Refill   acarbose (PRECOSE) 50 MG tablet Take 50 mg by mouth 2 (two) times daily.     ALPRAZolam (XANAX) 0.5 MG tablet Take 0.5  mg by mouth at bedtime as needed for sleep or anxiety.     atenolol (TENORMIN) 25 MG tablet Take 25 mg by mouth daily.      atorvastatin (LIPITOR) 20 MG tablet Take 20 mg by mouth daily.     Dulaglutide (TRULICITY) 3 0000000 SOPN Inject 3 mg into the skin every Wednesday.     DULoxetine (CYMBALTA) 30 MG capsule Take 1 capsule (30 mg total) by mouth daily. (Patient taking differently: Take 30 mg by mouth daily as needed (nerve pain).) 30 capsule 11   gabapentin (NEURONTIN) 300 MG capsule Take 300 mg by mouth in the morning.     glimepiride (AMARYL) 4 MG tablet Take 4 mg by mouth daily with breakfast.     lidocaine (LIDODERM) 5 % Place 1 patch onto the skin daily as needed (pain). Remove & Discard patch within 12 hours or as directed by MD      metFORMIN (GLUCOPHAGE-XR) 500 MG 24 hr tablet Take 500 mg by mouth 2 (two) times daily.     Multiple Vitamins-Minerals (MULTIVITAMIN WITH MINERALS) tablet Take 1 tablet by mouth daily.     ondansetron (ZOFRAN) 4 MG tablet Take 1 tablet (4 mg total) by mouth every 8 (eight) hours as needed for nausea or vomiting. 4 tablet 0   OVER THE COUNTER MEDICATION Take 1 tablet by mouth daily as needed (cramps). Leg Cramps     Oxycodone HCl 10 MG TABS Take 5-10 mg by mouth 3 (three) times daily as needed (pain).     pantoprazole (PROTONIX) 40 MG tablet Take 40 mg by mouth daily.     tamoxifen (NOLVADEX) 20 MG tablet TAKE ONE TABLET ('20MG'$  TOTAL) BY MOUTH EVERY MORNING 90 tablet 3  venlafaxine XR (EFFEXOR-XR) 37.5 MG 24 hr capsule TAKE ONE CAPSULE BY MOUTH EVERY MORNING 90 capsule 3   No current facility-administered medications for this visit.    PHYSICAL EXAMINATION: ECOG PERFORMANCE STATUS: {CHL ONC ECOG [phone]}  There were no vitals filed for this visit. There were no vitals filed for this visit.  BREAST:*** No palpable masses or nodules in either right or left breasts. No palpable axillary supraclavicular or infraclavicular adenopathy no breast  tenderness or nipple discharge. (exam performed in the presence of a chaperone)  LABORATORY DATA:  I have reviewed the data as listed    Latest Ref Rng & Units 04/22/2022    1:06 PM 08/14/2021   10:48 AM 05/05/2021   12:19 PM  CMP  Glucose 70 - 99 mg/dL 145  386  233   BUN 8 - 27 mg/dL '13  12  11   '$ Creatinine 0.57 - 1.00 mg/dL 0.97  1.08  0.96   Sodium 134 - 144 mmol/L 141  137  136   Potassium 3.5 - 5.2 mmol/L 4.2  4.4  3.9   Chloride 96 - 106 mmol/L 105  102  103   CO2 20 - 29 mmol/L '24  29  23   '$ Calcium 8.7 - 10.3 mg/dL 9.3  9.2  9.1   Total Protein 6.0 - 8.5 g/dL 6.1  6.3  6.7   Total Bilirubin 0.0 - 1.2 mg/dL 0.7  0.7  1.2   Alkaline Phos 44 - 121 IU/L 66  68  56   AST 0 - 40 IU/L 33  29  25   ALT 0 - 32 IU/L '26  26  28     '$ Lab Results  Component Value Date   WBC 6.3 08/14/2021   HGB 14.1 08/14/2021   HCT 41.1 08/14/2021   MCV 91.7 08/14/2021   PLT 143 (L) 08/14/2021   NEUTROABS 2.8 08/14/2021    ASSESSMENT & PLAN:  No problem-specific Assessment & Plan notes found for this encounter.    No orders of the defined types were placed in this encounter.  The patient has a good understanding of the overall plan. she agrees with it. she will call with any problems that may develop before the next visit here. Total time spent: 30 mins including face to face time and time spent for planning, charting and co-ordination of care   Suzzette Righter, [place] 08/14/22    I Gardiner Coins am acting as a Education administrator for [employer]  ***

## 2022-08-18 ENCOUNTER — Inpatient Hospital Stay: Payer: Medicare Other | Attending: Hematology and Oncology | Admitting: Hematology and Oncology

## 2022-08-18 ENCOUNTER — Other Ambulatory Visit: Payer: Self-pay

## 2022-08-18 VITALS — BP 137/80 | HR 112 | Temp 97.5°F | Resp 20 | Wt 186.5 lb

## 2022-08-18 DIAGNOSIS — M21379 Foot drop, unspecified foot: Secondary | ICD-10-CM | POA: Insufficient documentation

## 2022-08-18 DIAGNOSIS — R2681 Unsteadiness on feet: Secondary | ICD-10-CM | POA: Insufficient documentation

## 2022-08-18 DIAGNOSIS — Z79899 Other long term (current) drug therapy: Secondary | ICD-10-CM | POA: Insufficient documentation

## 2022-08-18 DIAGNOSIS — Z170 Estrogen receptor positive status [ER+]: Secondary | ICD-10-CM | POA: Insufficient documentation

## 2022-08-18 DIAGNOSIS — Z79810 Long term (current) use of selective estrogen receptor modulators (SERMs): Secondary | ICD-10-CM | POA: Insufficient documentation

## 2022-08-18 DIAGNOSIS — C50412 Malignant neoplasm of upper-outer quadrant of left female breast: Secondary | ICD-10-CM

## 2022-08-18 DIAGNOSIS — N951 Menopausal and female climacteric states: Secondary | ICD-10-CM | POA: Diagnosis not present

## 2022-08-18 MED ORDER — VENLAFAXINE HCL ER 37.5 MG PO CP24: ORAL_CAPSULE | ORAL | 3 refills | Status: DC

## 2022-08-18 MED ORDER — TAMOXIFEN CITRATE 20 MG PO TABS: ORAL_TABLET | ORAL | 3 refills | Status: DC

## 2022-08-18 NOTE — Assessment & Plan Note (Signed)
status post left breast upper outer quadrant biopsy 09/23/2016 for a clinical T1b N0, sage IA t invasive ductal carcinoma, grade 1, estrogen and progesterone receptor positive, HER-2 not amplified, with an MIB-1 of 10%   (1) status post left lumpectomy and sentinel lymph node sampling 10/13/2016 for a pT1c pN0, stage IA invasive ductal carcinoma, grade 1, with negative margins.   (2) Oncotype DX score of 25 predicts a 10 year risk of recurrence outside the breast of 17%   adjuvant radiation6/18/18 - 01/08/17  Left Breast// 50.4 Gy in 28 fractions    (4) started anastrozole July 2018; discontinued September 2018 with side effects             (a) bone density 12/01/2016 shows a T score of 1.3 (normal).   (5) started tamoxifen 05/16/2017 plan of treatment is 10 years Tamoxifen toxicities: Hot flashes which are well controlled with Effexor   Severe foot drop: Patient is working with orthopedics and is waiting for a foot brace. She likes to spend time with both of her sons who live in [place].   Breast Cancer Surveillance: 1. Breast Exam: 08/18/2022 benign 2. Mammogram: 03/20/2022: Less conspicuous left breast symmetric considered benign, density category B   RTC in 1 year

## 2022-08-26 DIAGNOSIS — G894 Chronic pain syndrome: Secondary | ICD-10-CM | POA: Diagnosis not present

## 2022-08-26 DIAGNOSIS — E1122 Type 2 diabetes mellitus with diabetic chronic kidney disease: Secondary | ICD-10-CM | POA: Diagnosis not present

## 2022-08-26 DIAGNOSIS — C50912 Malignant neoplasm of unspecified site of left female breast: Secondary | ICD-10-CM | POA: Diagnosis not present

## 2022-08-26 DIAGNOSIS — M5430 Sciatica, unspecified side: Secondary | ICD-10-CM | POA: Diagnosis not present

## 2022-08-26 DIAGNOSIS — E1165 Type 2 diabetes mellitus with hyperglycemia: Secondary | ICD-10-CM | POA: Diagnosis not present

## 2022-08-26 DIAGNOSIS — G629 Polyneuropathy, unspecified: Secondary | ICD-10-CM | POA: Diagnosis not present

## 2022-08-26 DIAGNOSIS — N1831 Chronic kidney disease, stage 3a: Secondary | ICD-10-CM | POA: Diagnosis not present

## 2022-08-26 DIAGNOSIS — E1140 Type 2 diabetes mellitus with diabetic neuropathy, unspecified: Secondary | ICD-10-CM | POA: Diagnosis not present

## 2022-08-26 DIAGNOSIS — E1129 Type 2 diabetes mellitus with other diabetic kidney complication: Secondary | ICD-10-CM | POA: Diagnosis not present

## 2022-08-27 IMAGING — MR MR SHOULDER*L* W/O CM
5 series · 40 of 40 positions shown · non-contrast
Comparison: X-ray humerus 05/28/2021.

CLINICAL DATA: Left shoulder pain radiates down left arm for 4
weeks related to a fall.

EXAM:
MRI OF THE LEFT SHOULDER WITHOUT CONTRAST
TECHNIQUE: Multiplanar, multisequence MR imaging of the shoulder was performed.
No intravenous contrast was administered.

[Series 4: T1 · coronal · left · 4.0mm · 0.41mm/px · 7 of 19 slices shown]
[im 1/19]
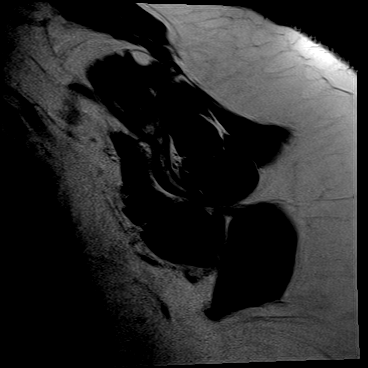
[im 4/19]
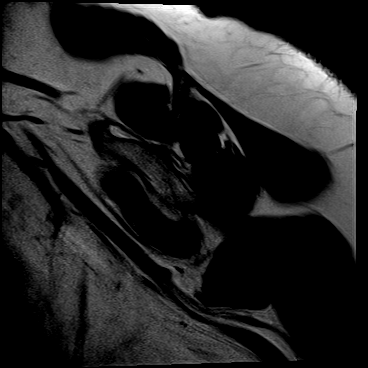
[im 7/19]
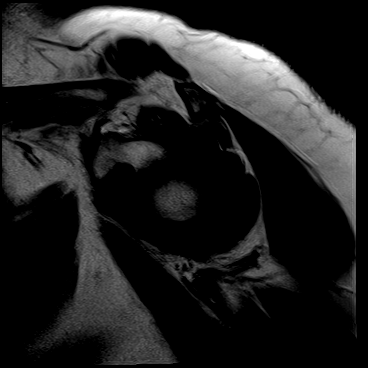
[im 10/19]
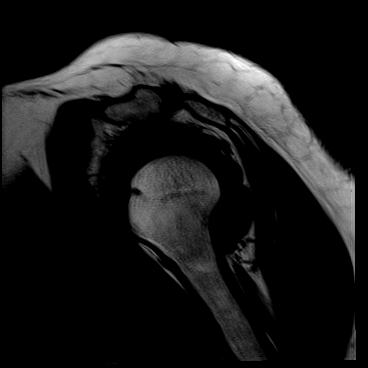
[im 13/19]
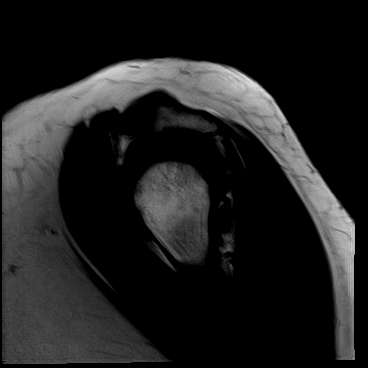
[im 16/19]
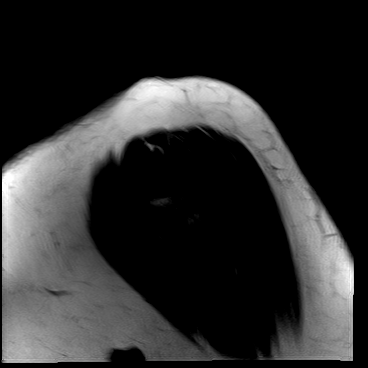
[im 19/19]
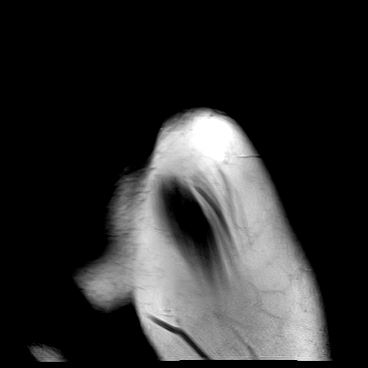

[Series 5: T2 fat-sat · coronal · left · 4.0mm · 0.47mm/px · 8 of 19 slices shown (1 of 3)]
[im 1/19]
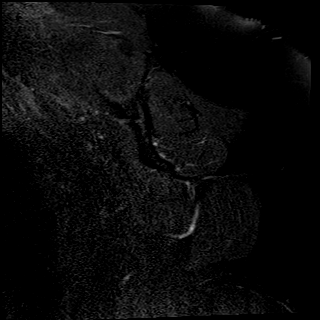
[im 3/19]
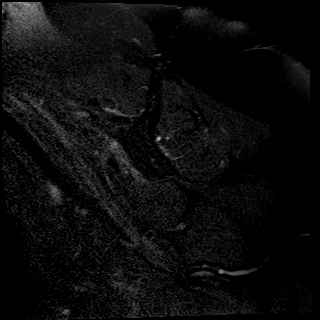
[im 6/19]
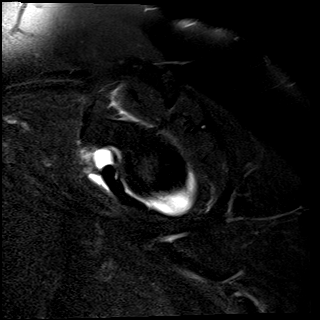
[im 8/19]
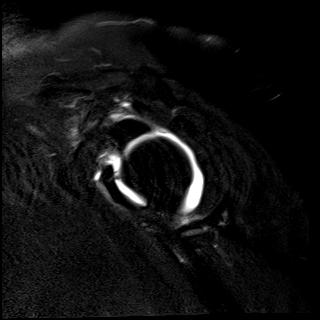
[im 11/19]
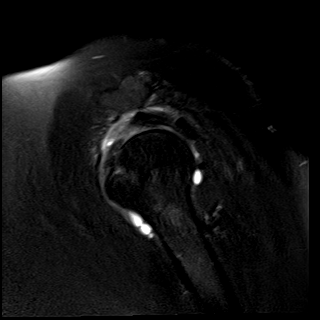
[im 13/19]
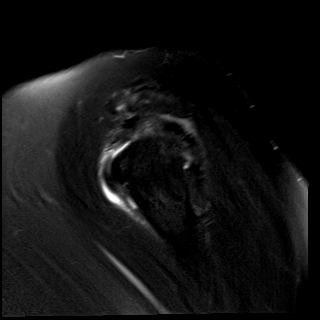
[im 16/19]
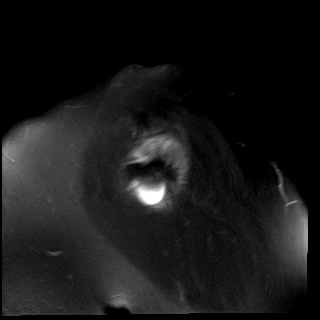
[im 19/19]
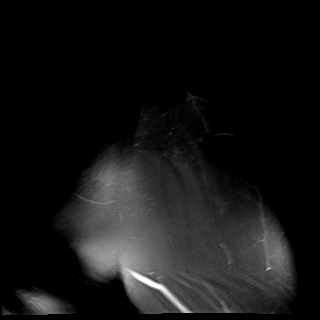

[Series 9: T2 fat-sat · axial · left · 4.0mm · 0.52mm/px · z∈[-62,+33]mm · 9 of 22 slices shown (2 of 3)]
[im 1/22]
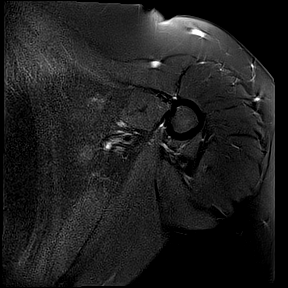
[im 3/22]
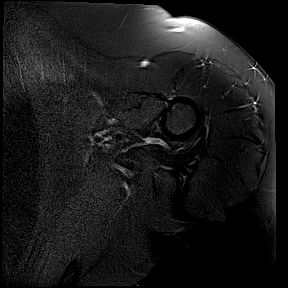
[im 6/22]
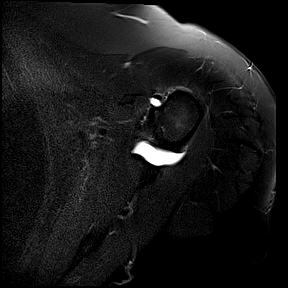
[im 8/22]
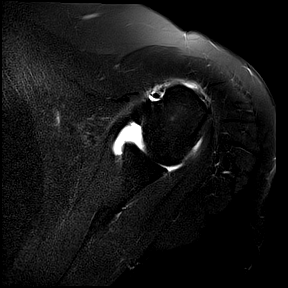
[im 11/22]
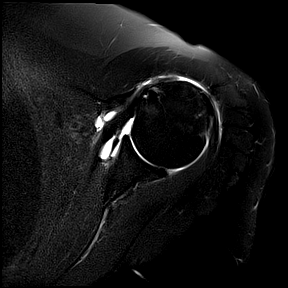
[im 14/22]
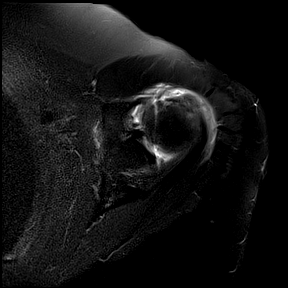
[im 16/22]
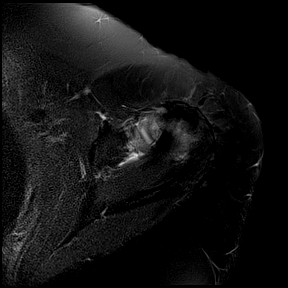
[im 19/22]
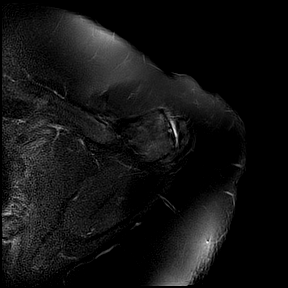
[im 22/22]
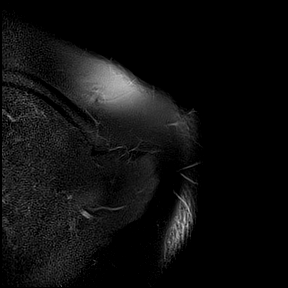

[Series 1010: T2 fat-sat · sagittal · left · 4.0mm · 0.47mm/px · 8 of 19 slices shown (3 of 3)]
[im 1/19]
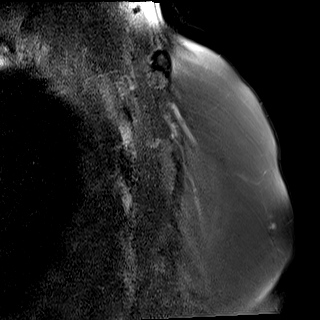
[im 3/19]
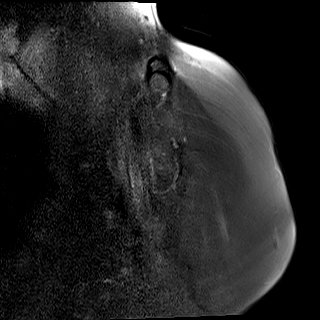
[im 6/19]
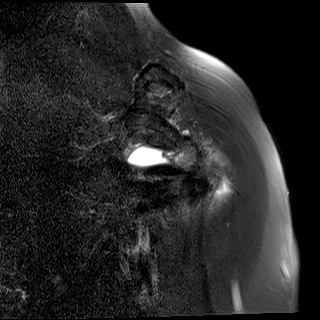
[im 8/19]
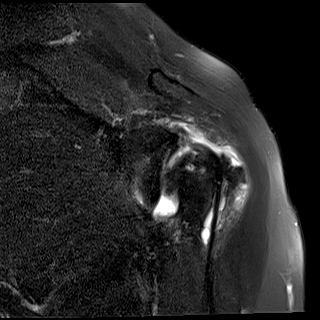
[im 11/19]
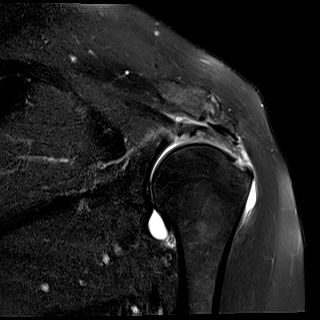
[im 13/19]
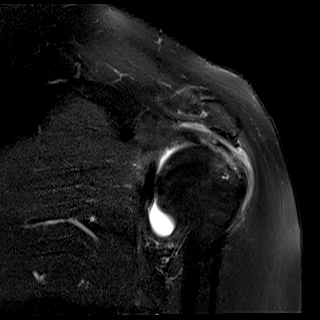
[im 16/19]
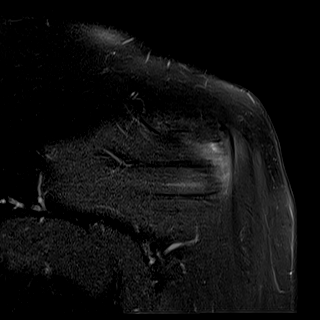
[im 19/19]
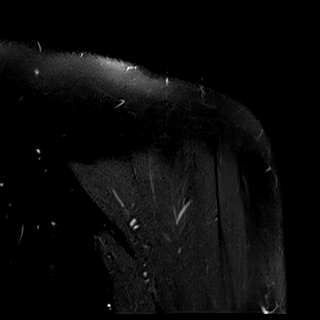

[Series 1012: PD · sagittal · left · 4.0mm · 0.43mm/px · 8 of 19 slices shown]
[im 1/19]
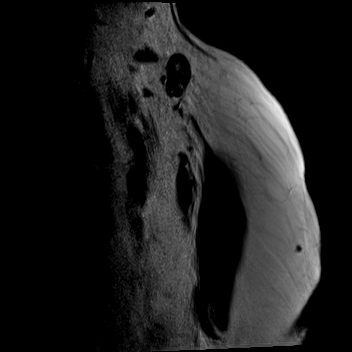
[im 3/19]
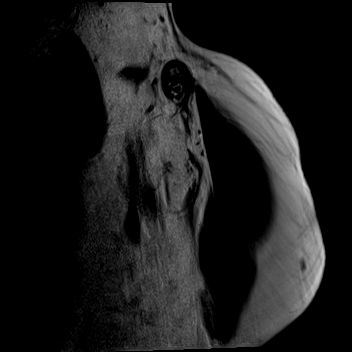
[im 6/19]
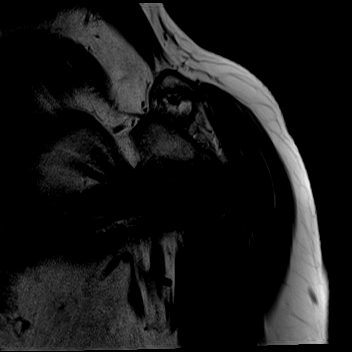
[im 8/19]
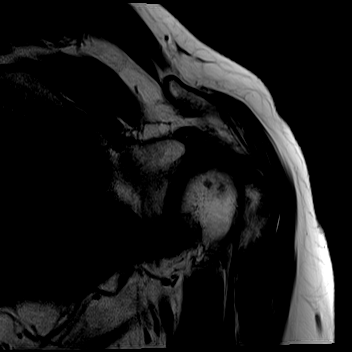
[im 11/19]
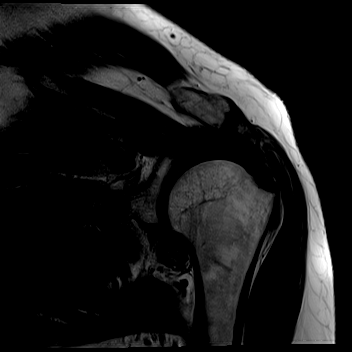
[im 13/19]
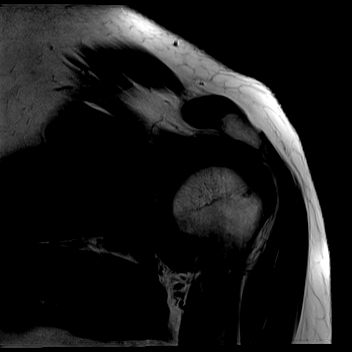
[im 16/19]
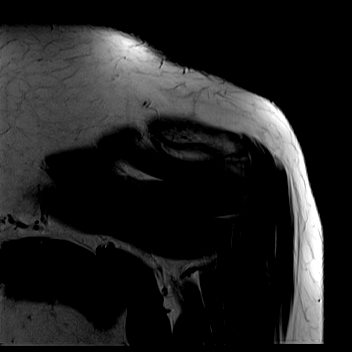
[im 19/19]
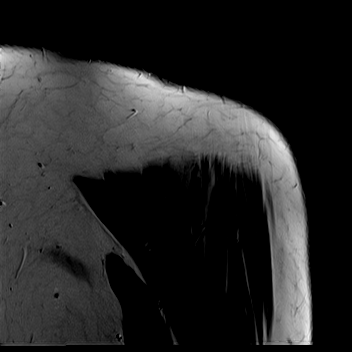

[40 of 40 positions shown; findings below may reference images not displayed]

FINDINGS: Technical Note: Despite efforts by the technologist and patient,
motion artifact is present on today's exam and could not be
eliminated. This reduces exam sensitivity and specificity.

Rotator cuff: Full-thickness near-complete tear of the distal
supraspinatus tendon with up to 9 mm of tendon retraction.
Background of advanced supraspinatus tendinosis. Moderate
infraspinatus and subscapularis tendinosis without discrete tear.
Intact teres minor.

Muscles: Preserved bulk and signal intensity of the rotator cuff
musculature without edema, atrophy, or fatty infiltration.

Biceps long head: Intact and appropriately positioned. Suspect mild
intra-articular biceps tendinosis.

Acromioclavicular Joint: Mild degenerative changes of the AC joint.
Small-moderate volume subacromial-subdeltoid bursal fluid.

Glenohumeral Joint: Small-moderate sized glenohumeral joint
effusion. No cartilage defect.

Labrum: Suspected focal tear of the inferior labrum anteriorly seen
only on axial sequence (series 9, image 16). No paralabral cyst.

Bones: No acute fracture. No dislocation. No bone marrow edema. No
marrow replacing bone lesion.

Other: None.
IMPRESSION: 1. Moderate rotator cuff tendinosis with a full-thickness
near-complete tear of the distal supraspinatus tendon with up to 9
mm of tendon retraction.
2. Mild intra-articular biceps tendinosis.
3. Suspected focal tear of the inferior labrum.
4. Small-moderate sized glenohumeral joint effusion.
5. Mild AC joint osteoarthritis.

## 2022-09-08 ENCOUNTER — Other Ambulatory Visit (HOSPITAL_COMMUNITY): Payer: Self-pay | Admitting: Family Medicine

## 2022-09-08 ENCOUNTER — Ambulatory Visit (HOSPITAL_COMMUNITY)
Admission: RE | Admit: 2022-09-08 | Discharge: 2022-09-08 | Disposition: A | Discharge status: Home / Self Care | Payer: Medicare Other | Source: Ambulatory Visit | Attending: Family Medicine | Admitting: Family Medicine

## 2022-09-08 DIAGNOSIS — J069 Acute upper respiratory infection, unspecified: Secondary | ICD-10-CM | POA: Diagnosis not present

## 2022-09-08 DIAGNOSIS — R6889 Other general symptoms and signs: Secondary | ICD-10-CM | POA: Diagnosis not present

## 2022-09-08 DIAGNOSIS — R059 Cough, unspecified: Secondary | ICD-10-CM | POA: Diagnosis not present

## 2022-09-08 DIAGNOSIS — R0602 Shortness of breath: Secondary | ICD-10-CM | POA: Diagnosis not present

## 2022-09-22 ENCOUNTER — Other Ambulatory Visit: Payer: Self-pay

## 2022-09-22 ENCOUNTER — Emergency Department (HOSPITAL_BASED_OUTPATIENT_CLINIC_OR_DEPARTMENT_OTHER): Payer: Medicare Other

## 2022-09-22 ENCOUNTER — Emergency Department (HOSPITAL_BASED_OUTPATIENT_CLINIC_OR_DEPARTMENT_OTHER): Payer: Medicare Other | Admitting: Radiology

## 2022-09-22 ENCOUNTER — Emergency Department (HOSPITAL_BASED_OUTPATIENT_CLINIC_OR_DEPARTMENT_OTHER)
Admission: EM | Admit: 2022-09-22 | Discharge: 2022-09-22 | Disposition: A | Discharge status: Home / Self Care | Payer: Medicare Other | Attending: Emergency Medicine | Admitting: Emergency Medicine

## 2022-09-22 ENCOUNTER — Encounter (HOSPITAL_BASED_OUTPATIENT_CLINIC_OR_DEPARTMENT_OTHER): Payer: Self-pay

## 2022-09-22 DIAGNOSIS — J45909 Unspecified asthma, uncomplicated: Secondary | ICD-10-CM | POA: Diagnosis not present

## 2022-09-22 DIAGNOSIS — R918 Other nonspecific abnormal finding of lung field: Secondary | ICD-10-CM | POA: Diagnosis not present

## 2022-09-22 DIAGNOSIS — Z8673 Personal history of transient ischemic attack (TIA), and cerebral infarction without residual deficits: Secondary | ICD-10-CM | POA: Diagnosis not present

## 2022-09-22 DIAGNOSIS — Z7984 Long term (current) use of oral hypoglycemic drugs: Secondary | ICD-10-CM | POA: Insufficient documentation

## 2022-09-22 DIAGNOSIS — M7989 Other specified soft tissue disorders: Secondary | ICD-10-CM | POA: Diagnosis not present

## 2022-09-22 DIAGNOSIS — J40 Bronchitis, not specified as acute or chronic: Secondary | ICD-10-CM | POA: Insufficient documentation

## 2022-09-22 DIAGNOSIS — E119 Type 2 diabetes mellitus without complications: Secondary | ICD-10-CM | POA: Diagnosis not present

## 2022-09-22 DIAGNOSIS — R059 Cough, unspecified: Secondary | ICD-10-CM | POA: Diagnosis present

## 2022-09-22 DIAGNOSIS — Z0389 Encounter for observation for other suspected diseases and conditions ruled out: Secondary | ICD-10-CM | POA: Diagnosis not present

## 2022-09-22 DIAGNOSIS — Z79899 Other long term (current) drug therapy: Secondary | ICD-10-CM | POA: Insufficient documentation

## 2022-09-22 DIAGNOSIS — I10 Essential (primary) hypertension: Secondary | ICD-10-CM | POA: Insufficient documentation

## 2022-09-22 LAB — BASIC METABOLIC PANEL
Anion gap: 9 (ref 5–15)
BUN: 16 mg/dL (ref 8–23)
CO2: 27 mmol/L (ref 22–32)
Calcium: 9.6 mg/dL (ref 8.9–10.3)
Chloride: 103 mmol/L (ref 98–111)
Creatinine, Ser: 1.08 mg/dL — ABNORMAL HIGH (ref 0.44–1.00)
GFR, Estimated: 57 mL/min — ABNORMAL LOW (ref >60)
Glucose, Bld: 145 mg/dL — ABNORMAL HIGH (ref 70–99)
Potassium: 3.9 mmol/L (ref 3.5–5.1)
Sodium: 139 mmol/L (ref 135–145)

## 2022-09-22 LAB — CBC WITH DIFFERENTIAL/PLATELET
Abs Immature Granulocytes: 0.02 10*3/uL (ref 0.00–0.07)
Basophils Absolute: 0 10*3/uL (ref 0.0–0.1)
Basophils Relative: 1 %
Eosinophils Absolute: 0.2 10*3/uL (ref 0.0–0.5)
Eosinophils Relative: 3 %
HCT: 39.1 % (ref 36.0–46.0)
Hemoglobin: 13.2 g/dL (ref 12.0–15.0)
Immature Granulocytes: 0 %
Lymphocytes Relative: 44 %
Lymphs Abs: 2.9 10*3/uL (ref 0.7–4.0)
MCH: 31.6 pg (ref 26.0–34.0)
MCHC: 33.8 g/dL (ref 30.0–36.0)
MCV: 93.5 fL (ref 80.0–100.0)
Monocytes Absolute: 0.6 10*3/uL (ref 0.1–1.0)
Monocytes Relative: 10 %
Neutro Abs: 2.7 10*3/uL (ref 1.7–7.7)
Neutrophils Relative %: 42 %
Platelets: 147 10*3/uL — ABNORMAL LOW (ref 150–400)
RBC: 4.18 MIL/uL (ref 3.87–5.11)
RDW: 13 % (ref 11.5–15.5)
WBC: 6.3 10*3/uL (ref 4.0–10.5)
nRBC: 0 % (ref 0.0–0.2)

## 2022-09-22 MED ORDER — PREDNISONE 10 MG PO TABS: 40.0000 mg | ORAL_TABLET | Freq: Every day | ORAL | 0 refills | Status: AC

## 2022-09-22 MED ORDER — IOHEXOL 350 MG/ML SOLN
100.0000 mL | Freq: Once | INTRAVENOUS | Status: AC | PRN
  Administered 2022-09-22: 80 mL via INTRAVENOUS

## 2022-09-22 MED ORDER — BENZONATATE 100 MG PO CAPS: 100.0000 mg | ORAL_CAPSULE | Freq: Three times a day (TID) | ORAL | 0 refills | Status: DC

## 2022-09-22 MED ORDER — ALBUTEROL SULFATE HFA 108 (90 BASE) MCG/ACT IN AERS
2.0000 | INHALATION_SPRAY | Freq: Once | RESPIRATORY_TRACT | Status: AC
  Administered 2022-09-22: 2 via RESPIRATORY_TRACT
  Filled 2022-09-22: qty 6.7

## 2022-09-22 NOTE — ED Triage Notes (Signed)
Pt states her PCP wanted her to come to r/o pneumonia vs mass. Pt c/o productive cough x4wks w associated back pain, lethargy, "fevers at first but not now." Tessalon pearls, abx, no relief.

## 2022-09-22 NOTE — Discharge Instructions (Addendum)
Please follow-up with your primary care provider.  Your CT imaging was negative for blood clot in your lungs and you had no evidence of pneumonia.  Your labs were reassuring.  Will start you on a steroid, continue Tessalon.

## 2022-09-22 NOTE — ED Provider Notes (Signed)
[place] EMERGENCY DEPARTMENT AT [facility] Provider Note   CSN: [id number] Arrival date & time: 09/22/22  1418     History {Add pertinent medical, surgical, social history, OB history to HPI:1} Chief Complaint  Patient presents with   Cough    [name] is a 67 y.o. female.   Cough      Home Medications Prior to Admission medications   Medication Sig Start Date End Date Taking? Authorizing Provider  acarbose (PRECOSE) 50 MG tablet Take 50 mg by mouth 2 (two) times daily. 03/29/21   [provider]  ALPRAZolam Prudy Feeler) 0.5 MG tablet Take 0.5 mg by mouth at bedtime as needed for sleep or anxiety. 12/25/14   [provider]  atenolol (TENORMIN) 25 MG tablet Take 25 mg by mouth daily.  12/17/10   [provider]  atorvastatin (LIPITOR) 20 MG tablet Take 20 mg by mouth daily.    [provider]  Dulaglutide (TRULICITY) 3 MG/0.5ML SOPN Inject 3 mg into the skin every Wednesday.    [provider]  DULoxetine (CYMBALTA) 30 MG capsule Take 1 capsule (30 mg total) by mouth daily. Patient taking differently: Take 30 mg by mouth daily as needed (nerve pain). 10/02/21   Levert Feinstein, MD  gabapentin (NEURONTIN) 300 MG capsule Take 300 mg by mouth in the morning.    [provider]  glimepiride (AMARYL) 4 MG tablet Take 4 mg by mouth daily with breakfast.    [provider]  lidocaine (LIDODERM) 5 % Place 1 patch onto the skin daily as needed (pain). Remove & Discard patch within 12 hours or as directed by MD     [provider]  metFORMIN (GLUCOPHAGE-XR) 500 MG 24 hr tablet Take 500 mg by mouth 2 (two) times daily. 05/01/21   [provider]  Multiple Vitamins-Minerals (MULTIVITAMIN WITH MINERALS) tablet Take 1 tablet by mouth daily.    [provider]  ondansetron (ZOFRAN) 4 MG tablet Take 1 tablet (4 mg total) by mouth every 8 (eight) hours as needed for nausea or vomiting. 05/05/21    Horton, Clabe Seal, DO  OVER THE COUNTER MEDICATION Take 1 tablet by mouth daily as needed (cramps). Leg Cramps    [provider]  Oxycodone HCl 10 MG TABS Take 5-10 mg by mouth 3 (three) times daily as needed (pain).    [provider]  pantoprazole (PROTONIX) 40 MG tablet Take 40 mg by mouth daily.    [provider]  tamoxifen (NOLVADEX) 20 MG tablet TAKE ONE TABLET (20MG  TOTAL) BY MOUTH EVERY MORNING 08/18/22   Serena Croissant, MD  venlafaxine XR (EFFEXOR-XR) 37.5 MG 24 hr capsule TAKE ONE CAPSULE BY MOUTH EVERY MORNING 08/18/22   Serena Croissant, MD      Allergies    Adhesive [tape], Lyrica [pregabalin], Augmentin [amoxicillin-pot clavulanate], Codeine, Penicillins, and Sulfonamide derivatives    Review of Systems   Review of Systems  Respiratory:  Positive for cough.     Physical Exam Updated Vital Signs BP 101/84   Pulse 81   Temp 98.6 F (37 C) (Oral)   Resp 18   SpO2 99%  Physical Exam  ED Results / Procedures / Treatments   Labs (all labs ordered are listed, but only abnormal results are displayed) Labs Reviewed - No data to display  EKG None  Radiology DG Chest 2 View  Result Date: 09/22/2022 CLINICAL DATA:  Rule out pneumonia EXAM: CHEST - 2 VIEW COMPARISON:  CXR 09/08/22  FINDINGS: No pleural effusion. No pneumothorax. Previously seen opacity at the right lung base is not visible on this exam. Cervical and lumbar spinal fusion hardware are redemonstrated. No new focal airspace opacity. No radiographically apparent displaced rib fractures. Visualized upper abdomen is unremarkable. Vertebral body heights are maintained. Surgical clips in the right upper quadrant. IMPRESSION: Previously seen opacity at the right lung base is not visualized on this exam. No new airspace opacities are visualized. Electronically Signed   By: Lorenza Cambridge M.D.   On: 09/22/2022 14:56    Procedures Procedures  {Document cardiac monitor, telemetry assessment procedure when  appropriate:1}  Medications Ordered in ED Medications - No data to display  ED Course/ Medical Decision Making/ A&P   {   Click here for ABCD2, HEART and other calculatorsREFRESH Note before signing :1}                          Medical Decision Making Amount and/or Complexity of Data Reviewed Radiology: ordered.   ***  {Document critical care time when appropriate:1} {Document review of labs and clinical decision tools ie heart score, Chads2Vasc2 etc:1}  {Document your independent review of radiology images, and any outside records:1} {Document your discussion with family members, caretakers, and with consultants:1} {Document social determinants of health affecting pt's care:1} {Document your decision making why or why not admission, treatments were needed:1} Final Clinical Impression(s) / ED Diagnoses Final diagnoses:  None    Rx / DC Orders ED Discharge Orders     None

## 2022-09-23 LAB — CULTURE, BLOOD (ROUTINE X 2): Culture: NO GROWTH

## 2022-09-24 LAB — CULTURE, BLOOD (ROUTINE X 2): Culture: NO GROWTH

## 2022-09-25 DIAGNOSIS — E1140 Type 2 diabetes mellitus with diabetic neuropathy, unspecified: Secondary | ICD-10-CM | POA: Diagnosis not present

## 2022-09-25 DIAGNOSIS — T50905A Adverse effect of unspecified drugs, medicaments and biological substances, initial encounter: Secondary | ICD-10-CM | POA: Diagnosis not present

## 2022-09-25 DIAGNOSIS — E1122 Type 2 diabetes mellitus with diabetic chronic kidney disease: Secondary | ICD-10-CM | POA: Diagnosis not present

## 2022-09-25 DIAGNOSIS — G629 Polyneuropathy, unspecified: Secondary | ICD-10-CM | POA: Diagnosis not present

## 2022-09-25 DIAGNOSIS — J45909 Unspecified asthma, uncomplicated: Secondary | ICD-10-CM | POA: Diagnosis not present

## 2022-09-25 DIAGNOSIS — G894 Chronic pain syndrome: Secondary | ICD-10-CM | POA: Diagnosis not present

## 2022-09-25 DIAGNOSIS — N1831 Chronic kidney disease, stage 3a: Secondary | ICD-10-CM | POA: Diagnosis not present

## 2022-09-25 DIAGNOSIS — E1165 Type 2 diabetes mellitus with hyperglycemia: Secondary | ICD-10-CM | POA: Diagnosis not present

## 2022-09-25 DIAGNOSIS — C50912 Malignant neoplasm of unspecified site of left female breast: Secondary | ICD-10-CM | POA: Diagnosis not present

## 2022-09-25 LAB — CULTURE, BLOOD (ROUTINE X 2)

## 2022-09-27 LAB — CULTURE, BLOOD (ROUTINE X 2)

## 2022-10-07 DIAGNOSIS — E1165 Type 2 diabetes mellitus with hyperglycemia: Secondary | ICD-10-CM | POA: Diagnosis not present

## 2022-10-14 DIAGNOSIS — N1831 Chronic kidney disease, stage 3a: Secondary | ICD-10-CM | POA: Diagnosis not present

## 2022-10-14 DIAGNOSIS — E1122 Type 2 diabetes mellitus with diabetic chronic kidney disease: Secondary | ICD-10-CM | POA: Diagnosis not present

## 2022-10-14 DIAGNOSIS — I129 Hypertensive chronic kidney disease with stage 1 through stage 4 chronic kidney disease, or unspecified chronic kidney disease: Secondary | ICD-10-CM | POA: Diagnosis not present

## 2022-10-23 DIAGNOSIS — E1140 Type 2 diabetes mellitus with diabetic neuropathy, unspecified: Secondary | ICD-10-CM | POA: Diagnosis not present

## 2022-10-23 DIAGNOSIS — E1122 Type 2 diabetes mellitus with diabetic chronic kidney disease: Secondary | ICD-10-CM | POA: Diagnosis not present

## 2022-10-23 DIAGNOSIS — N1831 Chronic kidney disease, stage 3a: Secondary | ICD-10-CM | POA: Diagnosis not present

## 2022-11-04 DIAGNOSIS — E1165 Type 2 diabetes mellitus with hyperglycemia: Secondary | ICD-10-CM | POA: Diagnosis not present

## 2022-11-04 DIAGNOSIS — I10 Essential (primary) hypertension: Secondary | ICD-10-CM | POA: Diagnosis not present

## 2022-11-12 ENCOUNTER — Other Ambulatory Visit: Payer: Self-pay | Admitting: Neurosurgery

## 2022-11-12 DIAGNOSIS — M5416 Radiculopathy, lumbar region: Secondary | ICD-10-CM

## 2022-11-14 ENCOUNTER — Ambulatory Visit
Admission: RE | Admit: 2022-11-14 | Discharge: 2022-11-14 | Disposition: A | Discharge status: Home / Self Care | Payer: Medicare Other | Source: Ambulatory Visit | Attending: Neurosurgery | Admitting: Neurosurgery

## 2022-11-14 DIAGNOSIS — E1122 Type 2 diabetes mellitus with diabetic chronic kidney disease: Secondary | ICD-10-CM | POA: Diagnosis not present

## 2022-11-14 DIAGNOSIS — N1831 Chronic kidney disease, stage 3a: Secondary | ICD-10-CM | POA: Diagnosis not present

## 2022-11-14 DIAGNOSIS — M47816 Spondylosis without myelopathy or radiculopathy, lumbar region: Secondary | ICD-10-CM | POA: Diagnosis not present

## 2022-11-14 DIAGNOSIS — I129 Hypertensive chronic kidney disease with stage 1 through stage 4 chronic kidney disease, or unspecified chronic kidney disease: Secondary | ICD-10-CM | POA: Diagnosis not present

## 2022-11-14 DIAGNOSIS — M5416 Radiculopathy, lumbar region: Secondary | ICD-10-CM

## 2022-11-14 DIAGNOSIS — M5126 Other intervertebral disc displacement, lumbar region: Secondary | ICD-10-CM | POA: Diagnosis not present

## 2022-11-14 DIAGNOSIS — E1140 Type 2 diabetes mellitus with diabetic neuropathy, unspecified: Secondary | ICD-10-CM | POA: Diagnosis not present

## 2022-11-14 DIAGNOSIS — Z981 Arthrodesis status: Secondary | ICD-10-CM | POA: Diagnosis not present

## 2022-11-20 DIAGNOSIS — M5416 Radiculopathy, lumbar region: Secondary | ICD-10-CM | POA: Diagnosis not present

## 2022-11-20 DIAGNOSIS — M48061 Spinal stenosis, lumbar region without neurogenic claudication: Secondary | ICD-10-CM | POA: Diagnosis not present

## 2022-11-24 DIAGNOSIS — M5416 Radiculopathy, lumbar region: Secondary | ICD-10-CM | POA: Diagnosis not present

## 2022-11-25 DIAGNOSIS — E1122 Type 2 diabetes mellitus with diabetic chronic kidney disease: Secondary | ICD-10-CM | POA: Diagnosis not present

## 2022-11-25 DIAGNOSIS — E1140 Type 2 diabetes mellitus with diabetic neuropathy, unspecified: Secondary | ICD-10-CM | POA: Diagnosis not present

## 2022-11-25 DIAGNOSIS — N1831 Chronic kidney disease, stage 3a: Secondary | ICD-10-CM | POA: Diagnosis not present

## 2022-11-25 DIAGNOSIS — G894 Chronic pain syndrome: Secondary | ICD-10-CM | POA: Diagnosis not present

## 2022-11-25 DIAGNOSIS — E1165 Type 2 diabetes mellitus with hyperglycemia: Secondary | ICD-10-CM | POA: Diagnosis not present

## 2022-11-25 DIAGNOSIS — M5430 Sciatica, unspecified side: Secondary | ICD-10-CM | POA: Diagnosis not present

## 2022-11-25 DIAGNOSIS — M5416 Radiculopathy, lumbar region: Secondary | ICD-10-CM | POA: Diagnosis not present

## 2022-12-14 DIAGNOSIS — N1831 Chronic kidney disease, stage 3a: Secondary | ICD-10-CM | POA: Diagnosis not present

## 2022-12-14 DIAGNOSIS — I129 Hypertensive chronic kidney disease with stage 1 through stage 4 chronic kidney disease, or unspecified chronic kidney disease: Secondary | ICD-10-CM | POA: Diagnosis not present

## 2022-12-14 DIAGNOSIS — E1140 Type 2 diabetes mellitus with diabetic neuropathy, unspecified: Secondary | ICD-10-CM | POA: Diagnosis not present

## 2022-12-14 DIAGNOSIS — E1122 Type 2 diabetes mellitus with diabetic chronic kidney disease: Secondary | ICD-10-CM | POA: Diagnosis not present

## 2022-12-16 DIAGNOSIS — E7800 Pure hypercholesterolemia, unspecified: Secondary | ICD-10-CM | POA: Diagnosis not present

## 2022-12-16 DIAGNOSIS — I10 Essential (primary) hypertension: Secondary | ICD-10-CM | POA: Diagnosis not present

## 2022-12-16 DIAGNOSIS — E1165 Type 2 diabetes mellitus with hyperglycemia: Secondary | ICD-10-CM | POA: Diagnosis not present

## 2022-12-16 DIAGNOSIS — C44391 Other specified malignant neoplasm of skin of nose: Secondary | ICD-10-CM | POA: Diagnosis not present

## 2022-12-16 DIAGNOSIS — G629 Polyneuropathy, unspecified: Secondary | ICD-10-CM | POA: Diagnosis not present

## 2022-12-16 DIAGNOSIS — G894 Chronic pain syndrome: Secondary | ICD-10-CM | POA: Diagnosis not present

## 2022-12-16 DIAGNOSIS — I129 Hypertensive chronic kidney disease with stage 1 through stage 4 chronic kidney disease, or unspecified chronic kidney disease: Secondary | ICD-10-CM | POA: Diagnosis not present

## 2022-12-25 DIAGNOSIS — E1165 Type 2 diabetes mellitus with hyperglycemia: Secondary | ICD-10-CM | POA: Diagnosis not present

## 2022-12-25 DIAGNOSIS — E1122 Type 2 diabetes mellitus with diabetic chronic kidney disease: Secondary | ICD-10-CM | POA: Diagnosis not present

## 2022-12-25 DIAGNOSIS — M5430 Sciatica, unspecified side: Secondary | ICD-10-CM | POA: Diagnosis not present

## 2022-12-25 DIAGNOSIS — N1831 Chronic kidney disease, stage 3a: Secondary | ICD-10-CM | POA: Diagnosis not present

## 2022-12-25 DIAGNOSIS — E1140 Type 2 diabetes mellitus with diabetic neuropathy, unspecified: Secondary | ICD-10-CM | POA: Diagnosis not present

## 2022-12-25 DIAGNOSIS — G894 Chronic pain syndrome: Secondary | ICD-10-CM | POA: Diagnosis not present

## 2022-12-25 DIAGNOSIS — M5416 Radiculopathy, lumbar region: Secondary | ICD-10-CM | POA: Diagnosis not present

## 2023-01-01 DIAGNOSIS — M5416 Radiculopathy, lumbar region: Secondary | ICD-10-CM | POA: Diagnosis not present

## 2023-01-01 DIAGNOSIS — M48061 Spinal stenosis, lumbar region without neurogenic claudication: Secondary | ICD-10-CM | POA: Diagnosis not present

## 2023-01-26 DIAGNOSIS — M2550 Pain in unspecified joint: Secondary | ICD-10-CM | POA: Diagnosis not present

## 2023-01-26 DIAGNOSIS — E1165 Type 2 diabetes mellitus with hyperglycemia: Secondary | ICD-10-CM | POA: Diagnosis not present

## 2023-01-28 DIAGNOSIS — M25562 Pain in left knee: Secondary | ICD-10-CM | POA: Diagnosis not present

## 2023-01-28 DIAGNOSIS — E0840 Diabetes mellitus due to underlying condition with diabetic neuropathy, unspecified: Secondary | ICD-10-CM | POA: Diagnosis not present

## 2023-01-28 DIAGNOSIS — M79642 Pain in left hand: Secondary | ICD-10-CM | POA: Diagnosis not present

## 2023-01-28 DIAGNOSIS — M25561 Pain in right knee: Secondary | ICD-10-CM | POA: Diagnosis not present

## 2023-01-28 DIAGNOSIS — M79672 Pain in left foot: Secondary | ICD-10-CM | POA: Diagnosis not present

## 2023-01-28 DIAGNOSIS — M5136 Other intervertebral disc degeneration, lumbar region: Secondary | ICD-10-CM | POA: Diagnosis not present

## 2023-01-28 DIAGNOSIS — E1165 Type 2 diabetes mellitus with hyperglycemia: Secondary | ICD-10-CM | POA: Diagnosis not present

## 2023-01-28 DIAGNOSIS — M79641 Pain in right hand: Secondary | ICD-10-CM | POA: Diagnosis not present

## 2023-01-28 DIAGNOSIS — M25552 Pain in left hip: Secondary | ICD-10-CM | POA: Diagnosis not present

## 2023-01-28 DIAGNOSIS — M1990 Unspecified osteoarthritis, unspecified site: Secondary | ICD-10-CM | POA: Diagnosis not present

## 2023-01-28 DIAGNOSIS — M79671 Pain in right foot: Secondary | ICD-10-CM | POA: Diagnosis not present

## 2023-01-28 DIAGNOSIS — G5600 Carpal tunnel syndrome, unspecified upper limb: Secondary | ICD-10-CM | POA: Diagnosis not present

## 2023-01-28 DIAGNOSIS — M2550 Pain in unspecified joint: Secondary | ICD-10-CM | POA: Diagnosis not present

## 2023-01-28 DIAGNOSIS — M25551 Pain in right hip: Secondary | ICD-10-CM | POA: Diagnosis not present

## 2023-02-10 DIAGNOSIS — E1165 Type 2 diabetes mellitus with hyperglycemia: Secondary | ICD-10-CM | POA: Diagnosis not present

## 2023-02-10 DIAGNOSIS — E7800 Pure hypercholesterolemia, unspecified: Secondary | ICD-10-CM | POA: Diagnosis not present

## 2023-02-10 DIAGNOSIS — E0840 Diabetes mellitus due to underlying condition with diabetic neuropathy, unspecified: Secondary | ICD-10-CM | POA: Diagnosis not present

## 2023-02-10 DIAGNOSIS — I10 Essential (primary) hypertension: Secondary | ICD-10-CM | POA: Diagnosis not present

## 2023-02-17 DIAGNOSIS — E0840 Diabetes mellitus due to underlying condition with diabetic neuropathy, unspecified: Secondary | ICD-10-CM | POA: Diagnosis not present

## 2023-02-17 DIAGNOSIS — M6530 Trigger finger, unspecified finger: Secondary | ICD-10-CM | POA: Diagnosis not present

## 2023-02-17 DIAGNOSIS — M79644 Pain in right finger(s): Secondary | ICD-10-CM | POA: Diagnosis not present

## 2023-02-17 DIAGNOSIS — M1990 Unspecified osteoarthritis, unspecified site: Secondary | ICD-10-CM | POA: Diagnosis not present

## 2023-02-17 DIAGNOSIS — M5136 Other intervertebral disc degeneration, lumbar region: Secondary | ICD-10-CM | POA: Diagnosis not present

## 2023-02-17 DIAGNOSIS — E1165 Type 2 diabetes mellitus with hyperglycemia: Secondary | ICD-10-CM | POA: Diagnosis not present

## 2023-02-17 DIAGNOSIS — G5600 Carpal tunnel syndrome, unspecified upper limb: Secondary | ICD-10-CM | POA: Diagnosis not present

## 2023-02-17 DIAGNOSIS — M2550 Pain in unspecified joint: Secondary | ICD-10-CM | POA: Diagnosis not present

## 2023-02-19 DIAGNOSIS — M5416 Radiculopathy, lumbar region: Secondary | ICD-10-CM | POA: Diagnosis not present

## 2023-02-19 DIAGNOSIS — M5430 Sciatica, unspecified side: Secondary | ICD-10-CM | POA: Diagnosis not present

## 2023-02-19 DIAGNOSIS — E1165 Type 2 diabetes mellitus with hyperglycemia: Secondary | ICD-10-CM | POA: Diagnosis not present

## 2023-02-19 DIAGNOSIS — N1831 Chronic kidney disease, stage 3a: Secondary | ICD-10-CM | POA: Diagnosis not present

## 2023-02-19 DIAGNOSIS — E1140 Type 2 diabetes mellitus with diabetic neuropathy, unspecified: Secondary | ICD-10-CM | POA: Diagnosis not present

## 2023-02-19 DIAGNOSIS — E1122 Type 2 diabetes mellitus with diabetic chronic kidney disease: Secondary | ICD-10-CM | POA: Diagnosis not present

## 2023-02-19 DIAGNOSIS — G894 Chronic pain syndrome: Secondary | ICD-10-CM | POA: Diagnosis not present

## 2023-03-06 ENCOUNTER — Other Ambulatory Visit: Payer: Self-pay | Admitting: *Deleted

## 2023-03-06 MED ORDER — VENLAFAXINE HCL ER 37.5 MG PO CP24: ORAL_CAPSULE | ORAL | 3 refills | Status: AC

## 2023-03-06 MED ORDER — TAMOXIFEN CITRATE 20 MG PO TABS: ORAL_TABLET | ORAL | 3 refills | Status: DC

## 2023-03-16 ENCOUNTER — Other Ambulatory Visit (HOSPITAL_COMMUNITY): Payer: Self-pay | Admitting: Internal Medicine

## 2023-03-16 DIAGNOSIS — Z1231 Encounter for screening mammogram for malignant neoplasm of breast: Secondary | ICD-10-CM

## 2023-03-17 DIAGNOSIS — M5412 Radiculopathy, cervical region: Secondary | ICD-10-CM | POA: Diagnosis not present

## 2023-03-17 DIAGNOSIS — M5430 Sciatica, unspecified side: Secondary | ICD-10-CM | POA: Diagnosis not present

## 2023-03-17 DIAGNOSIS — M5416 Radiculopathy, lumbar region: Secondary | ICD-10-CM | POA: Diagnosis not present

## 2023-03-17 DIAGNOSIS — N1831 Chronic kidney disease, stage 3a: Secondary | ICD-10-CM | POA: Diagnosis not present

## 2023-03-17 DIAGNOSIS — I10 Essential (primary) hypertension: Secondary | ICD-10-CM | POA: Diagnosis not present

## 2023-03-17 DIAGNOSIS — G894 Chronic pain syndrome: Secondary | ICD-10-CM | POA: Diagnosis not present

## 2023-03-17 DIAGNOSIS — B309 Viral conjunctivitis, unspecified: Secondary | ICD-10-CM | POA: Diagnosis not present

## 2023-03-17 DIAGNOSIS — E1165 Type 2 diabetes mellitus with hyperglycemia: Secondary | ICD-10-CM | POA: Diagnosis not present

## 2023-03-17 DIAGNOSIS — E1140 Type 2 diabetes mellitus with diabetic neuropathy, unspecified: Secondary | ICD-10-CM | POA: Diagnosis not present

## 2023-03-17 DIAGNOSIS — E1122 Type 2 diabetes mellitus with diabetic chronic kidney disease: Secondary | ICD-10-CM | POA: Diagnosis not present

## 2023-03-25 ENCOUNTER — Ambulatory Visit (HOSPITAL_COMMUNITY): Payer: Medicare Other

## 2023-03-27 DIAGNOSIS — U071 COVID-19: Secondary | ICD-10-CM | POA: Diagnosis not present

## 2023-03-27 DIAGNOSIS — R051 Acute cough: Secondary | ICD-10-CM | POA: Diagnosis not present

## 2023-03-27 DIAGNOSIS — J22 Unspecified acute lower respiratory infection: Secondary | ICD-10-CM | POA: Diagnosis not present

## 2023-03-27 DIAGNOSIS — J209 Acute bronchitis, unspecified: Secondary | ICD-10-CM | POA: Diagnosis not present

## 2023-03-27 DIAGNOSIS — J9801 Acute bronchospasm: Secondary | ICD-10-CM | POA: Diagnosis not present

## 2023-04-02 DIAGNOSIS — E1165 Type 2 diabetes mellitus with hyperglycemia: Secondary | ICD-10-CM | POA: Diagnosis not present

## 2023-04-02 DIAGNOSIS — E1140 Type 2 diabetes mellitus with diabetic neuropathy, unspecified: Secondary | ICD-10-CM | POA: Diagnosis not present

## 2023-04-02 DIAGNOSIS — J9801 Acute bronchospasm: Secondary | ICD-10-CM | POA: Diagnosis not present

## 2023-04-02 DIAGNOSIS — G894 Chronic pain syndrome: Secondary | ICD-10-CM | POA: Diagnosis not present

## 2023-04-02 DIAGNOSIS — U071 COVID-19: Secondary | ICD-10-CM | POA: Diagnosis not present

## 2023-04-02 DIAGNOSIS — E1122 Type 2 diabetes mellitus with diabetic chronic kidney disease: Secondary | ICD-10-CM | POA: Diagnosis not present

## 2023-04-08 ENCOUNTER — Encounter (HOSPITAL_COMMUNITY): Payer: Self-pay

## 2023-04-08 ENCOUNTER — Ambulatory Visit (HOSPITAL_COMMUNITY)
Admission: RE | Admit: 2023-04-08 | Discharge: 2023-04-08 | Disposition: A | Discharge status: Home / Self Care | Payer: Medicare Other | Source: Ambulatory Visit | Attending: Internal Medicine | Admitting: Internal Medicine

## 2023-04-08 DIAGNOSIS — Z1231 Encounter for screening mammogram for malignant neoplasm of breast: Secondary | ICD-10-CM | POA: Diagnosis not present

## 2023-04-08 HISTORY — DX: Personal history of irradiation: Z92.3

## 2023-04-23 DIAGNOSIS — E7800 Pure hypercholesterolemia, unspecified: Secondary | ICD-10-CM | POA: Diagnosis not present

## 2023-04-23 DIAGNOSIS — E0840 Diabetes mellitus due to underlying condition with diabetic neuropathy, unspecified: Secondary | ICD-10-CM | POA: Diagnosis not present

## 2023-04-23 DIAGNOSIS — I10 Essential (primary) hypertension: Secondary | ICD-10-CM | POA: Diagnosis not present

## 2023-04-23 DIAGNOSIS — E1165 Type 2 diabetes mellitus with hyperglycemia: Secondary | ICD-10-CM | POA: Diagnosis not present

## 2023-05-16 DIAGNOSIS — I130 Hypertensive heart and chronic kidney disease with heart failure and stage 1 through stage 4 chronic kidney disease, or unspecified chronic kidney disease: Secondary | ICD-10-CM | POA: Diagnosis not present

## 2023-05-16 DIAGNOSIS — E1140 Type 2 diabetes mellitus with diabetic neuropathy, unspecified: Secondary | ICD-10-CM | POA: Diagnosis not present

## 2023-05-25 DIAGNOSIS — G894 Chronic pain syndrome: Secondary | ICD-10-CM | POA: Diagnosis not present

## 2023-05-25 DIAGNOSIS — E1165 Type 2 diabetes mellitus with hyperglycemia: Secondary | ICD-10-CM | POA: Diagnosis not present

## 2023-05-25 DIAGNOSIS — I130 Hypertensive heart and chronic kidney disease with heart failure and stage 1 through stage 4 chronic kidney disease, or unspecified chronic kidney disease: Secondary | ICD-10-CM | POA: Diagnosis not present

## 2023-05-25 DIAGNOSIS — N1831 Chronic kidney disease, stage 3a: Secondary | ICD-10-CM | POA: Diagnosis not present

## 2023-05-25 DIAGNOSIS — M5416 Radiculopathy, lumbar region: Secondary | ICD-10-CM | POA: Diagnosis not present

## 2023-05-25 DIAGNOSIS — E1140 Type 2 diabetes mellitus with diabetic neuropathy, unspecified: Secondary | ICD-10-CM | POA: Diagnosis not present

## 2023-08-19 ENCOUNTER — Inpatient Hospital Stay: Payer: Medicare Other | Attending: Hematology and Oncology | Admitting: Hematology and Oncology

## 2023-08-19 VITALS — BP 121/71 | HR 85 | Temp 98.3°F | Resp 17 | Ht 65.0 in | Wt 176.7 lb

## 2023-08-19 DIAGNOSIS — E119 Type 2 diabetes mellitus without complications: Secondary | ICD-10-CM | POA: Diagnosis not present

## 2023-08-19 DIAGNOSIS — Z170 Estrogen receptor positive status [ER+]: Secondary | ICD-10-CM | POA: Diagnosis not present

## 2023-08-19 DIAGNOSIS — Z7985 Long-term (current) use of injectable non-insulin antidiabetic drugs: Secondary | ICD-10-CM | POA: Diagnosis not present

## 2023-08-19 DIAGNOSIS — R634 Abnormal weight loss: Secondary | ICD-10-CM | POA: Diagnosis not present

## 2023-08-19 DIAGNOSIS — M21379 Foot drop, unspecified foot: Secondary | ICD-10-CM | POA: Diagnosis not present

## 2023-08-19 DIAGNOSIS — G8929 Other chronic pain: Secondary | ICD-10-CM | POA: Insufficient documentation

## 2023-08-19 DIAGNOSIS — C50412 Malignant neoplasm of upper-outer quadrant of left female breast: Secondary | ICD-10-CM | POA: Diagnosis present

## 2023-08-19 DIAGNOSIS — M069 Rheumatoid arthritis, unspecified: Secondary | ICD-10-CM | POA: Insufficient documentation

## 2023-08-19 DIAGNOSIS — Z923 Personal history of irradiation: Secondary | ICD-10-CM | POA: Insufficient documentation

## 2023-08-19 DIAGNOSIS — M549 Dorsalgia, unspecified: Secondary | ICD-10-CM | POA: Diagnosis not present

## 2023-08-19 DIAGNOSIS — Z79810 Long term (current) use of selective estrogen receptor modulators (SERMs): Secondary | ICD-10-CM | POA: Diagnosis not present

## 2023-08-19 DIAGNOSIS — M5430 Sciatica, unspecified side: Secondary | ICD-10-CM | POA: Insufficient documentation

## 2023-08-19 NOTE — Progress Notes (Signed)
 Patient Care Team: Elfredia Nevins, MD as PCP - General (Internal Medicine) Emelia Loron, MD as Consulting Physician (General Surgery) Magrinat, Valentino Hue, MD (Inactive) as Consulting Physician (Oncology) Antony Blackbird, MD as Consulting Physician (Radiation Oncology) Malissa Hippo, MD (Inactive) as Consulting Physician (Gastroenterology) Axel Filler Larna Daughters, NP as Nurse Practitioner (Hematology and Oncology)  DIAGNOSIS:  Encounter Diagnosis  Name Primary?   Malignant neoplasm of upper-outer quadrant of left breast in female, estrogen receptor positive ([facility]) Yes    SUMMARY OF ONCOLOGIC HISTORY: Oncology History  Malignant neoplasm of upper-outer quadrant of left breast in female, estrogen receptor positive ([facility])  09/23/2016 Initial Biopsy   Left breast upper outer quadrant biopsy: IDC, g1, ER+(100%), PR+(90%), Ki-67 10%, HER-2 negative (ratio 1.05).     09/30/2016 Initial Diagnosis   Malignant neoplasm of upper-outer quadrant of left breast in female, estrogen receptor positive ([facility])   10/13/2016 Surgery   Left lumpectomy and SLNB: IDC, 1.1 cm, g 1, margins negative, 4 SLN negative, T1cN0   10/13/2016 Oncotype testing   25/17% intermediate risk   12/01/2016 - 01/08/2017 Radiation Therapy   Adjuvant radiation ([facility]): Left Breast// 50.4 Gy in 28 fractions   02/2017 -  Anti-estrogen oral therapy   Anastrozole daily (bone density 11/2016 normal), switched to tamoxifen December 2018 (plan for 10 years     CHIEF COMPLIANT: Follow-up on tamoxifen  HISTORY OF PRESENT ILLNESS:   History of Present Illness The patient, with a history of diabetes, rheumatoid arthritis, and a history of breast cancer, presents for a follow-up visit. She reports significant weight loss of 90 pounds over the past year, which she attributes to changes in her diabetes management. She experienced a spike in her blood sugar levels due to steroid use, which led her to seek help from a specialist. She  was initially on Trulicity, but was switched to Oseni and then Ozempic, which caused diarrhea. She is currently on Mounjaro, which she reports has been effective in managing her diabetes.  The patient also reports ongoing back pain following a surgery, which is exacerbated by weather changes. She also has sciatica and rheumatoid arthritis, which contribute to her discomfort. Despite these issues, she reports feeling good overall.  The patient is also on tamoxifen for her history of breast cancer. She reports experiencing hot flashes, but these are not frequent and are manageable. She occasionally experiences chest discomfort, particularly when stressed, but has not sought emergency care for this symptom.     ALLERGIES:  is allergic to adhesive [tape], lyrica [pregabalin], augmentin [amoxicillin-pot clavulanate], codeine, penicillins, and sulfonamide derivatives.  MEDICATIONS:  Current Outpatient Medications  Medication Sig Dispense Refill   ALPRAZolam (XANAX) 0.5 MG tablet Take 0.5 mg by mouth at bedtime as needed for sleep or anxiety.     atenolol (TENORMIN) 25 MG tablet Take 25 mg by mouth daily.      gabapentin (NEURONTIN) 300 MG capsule Take 100 mg by mouth in the morning.     OVER THE COUNTER MEDICATION Take 1 tablet by mouth daily as needed (cramps). Leg Cramps     Oxycodone HCl 10 MG TABS Take 5-10 mg by mouth 3 (three) times daily as needed (pain).     tamoxifen (NOLVADEX) 20 MG tablet TAKE ONE TABLET (20MG  TOTAL) BY MOUTH EVERY MORNING 90 tablet 3   TRESIBA FLEXTOUCH 200 UNIT/ML FlexTouch Pen      venlafaxine XR (EFFEXOR-XR) 37.5 MG 24 hr capsule TAKE ONE CAPSULE BY MOUTH EVERY MORNING 90 capsule 3  lidocaine (LIDODERM) 5 % Place 1 patch onto the skin daily as needed (pain). Remove & Discard patch within 12 hours or as directed by MD  (Patient not taking: Reported on 08/19/2023)     ondansetron (ZOFRAN) 4 MG tablet Take 1 tablet (4 mg total) by mouth every 8 (eight) hours as needed for  nausea or vomiting. (Patient not taking: Reported on 08/19/2023) 4 tablet 0   No current facility-administered medications for this visit.    PHYSICAL EXAMINATION: ECOG PERFORMANCE STATUS: 1 - Symptomatic but completely ambulatory  Vitals:   08/19/23 1051  BP: 121/71  Pulse: 85  Resp: 17  Temp: 98.3 F (36.8 C)  SpO2: 100%   Filed Weights   08/19/23 1051  Weight: 176 lb 11.2 oz (80.2 kg)      LABORATORY DATA:  I have reviewed the data as listed    Latest Ref Rng & Units 09/22/2022    6:55 PM 04/22/2022    1:06 PM 08/14/2021   10:48 AM  CMP  Glucose 70 - 99 mg/dL 161  096  045   BUN 8 - 23 mg/dL 16  13  12    Creatinine 0.44 - 1.00 mg/dL 4.09  8.11  9.14   Sodium 135 - 145 mmol/L 139  141  137   Potassium 3.5 - 5.1 mmol/L 3.9  4.2  4.4   Chloride 98 - 111 mmol/L 103  105  102   CO2 22 - 32 mmol/L 27  24  29    Calcium 8.9 - 10.3 mg/dL 9.6  9.3  9.2   Total Protein 6.0 - 8.5 g/dL  6.1  6.3   Total Bilirubin 0.0 - 1.2 mg/dL  0.7  0.7   Alkaline Phos 44 - 121 IU/L  66  68   AST 0 - 40 IU/L  33  29   ALT 0 - 32 IU/L  26  26     Lab Results  Component Value Date   WBC 6.3 09/22/2022   HGB 13.2 09/22/2022   HCT 39.1 09/22/2022   MCV 93.5 09/22/2022   PLT 147 (L) 09/22/2022   NEUTROABS 2.7 09/22/2022    ASSESSMENT & PLAN:  Malignant neoplasm of upper-outer quadrant of left breast in female, estrogen receptor positive ([facility])  left breast upper outer quadrant biopsy 09/23/2016 for a clinical T1b N0, sage IA t invasive ductal carcinoma, grade 1, estrogen and progesterone receptor positive, HER-2 not amplified, with an MIB-1 of 10%   (1) status post left lumpectomy and sentinel lymph node sampling 10/13/2016 for a pT1c pN0, stage IA invasive ductal carcinoma, grade 1, with negative margins.   (2) Oncotype DX score of 25 predicts a 10 year risk of recurrence outside the breast of 17%   adjuvant radiation6/18/18 - 01/08/17  Left Breast// 50.4 Gy in 28 fractions    (4)  started anastrozole July 2018; discontinued September 2018 with side effects             (a) bone density 12/01/2016 shows a T score of 1.3 (normal).   (5) started tamoxifen 05/16/2017 plan of treatment is 10 years Tamoxifen toxicities: Hot flashes which are well controlled with Effexor Our plan is to treat her with 10 years of tamoxifen.   Severe foot drop: She does not like to use the brace. She likes to spend time with both of her sons who live in [place].   Breast Cancer Surveillance: 1. Breast Exam: 08/19/2023 benign 2. Mammogram: 04/09/2023: benign, density category B  RTC in 1 year ------------------------------------- Assessment and Plan Assessment & Plan Diabetes Mellitus Recent exacerbation due to steroid use. Transitioned from Trulicity to [place], then to Ozempic (discontinued due to diarrhea), and currently on Mounjaro with significant weight loss and improved control. -Continue Mounjaro as tolerated.  Breast Cancer On Tamoxifen for over 6 years with tolerable hot flashes. Occasional chest discomfort, more noticeable with stress, but no ER visits. -Continue Tamoxifen as tolerated. -Physical examination of the chest to assess for any abnormalities.  Rheumatoid Arthritis and Sciatica Chronic back pain, exacerbated by weather changes. -Continue current pain management regimen.  General Health Maintenance Significant weight loss and overall improvement in health. -Continue current lifestyle modifications and medications.      No orders of the defined types were placed in this encounter.  The patient has a good understanding of the overall plan. she agrees with it. she will call with any problems that may develop before the next visit here. Total time spent: 30 mins including face to face time and time spent for planning, charting and co-ordination of care   Tamsen Meek, MD 08/19/23

## 2023-08-19 NOTE — Assessment & Plan Note (Signed)
 left breast upper outer quadrant biopsy 09/23/2016 for a clinical T1b N0, sage IA t invasive ductal carcinoma, grade 1, estrogen and progesterone receptor positive, HER-2 not amplified, with an MIB-1 of 10%   (1) status post left lumpectomy and sentinel lymph node sampling 10/13/2016 for a pT1c pN0, stage IA invasive ductal carcinoma, grade 1, with negative margins.   (2) Oncotype DX score of 25 predicts a 10 year risk of recurrence outside the breast of 17%   adjuvant radiation6/18/18 - 01/08/17  Left Breast// 50.4 Gy in 28 fractions    (4) started anastrozole July 2018; discontinued September 2018 with side effects             (a) bone density 12/01/2016 shows a T score of 1.3 (normal).   (5) started tamoxifen 05/16/2017 plan of treatment is 10 years Tamoxifen toxicities: Hot flashes which are well controlled with Effexor Our plan is to treat her with 10 years of tamoxifen.   Severe foot drop: She does not like to use the brace. She likes to spend time with both of her sons who live in [place].   Breast Cancer Surveillance: 1. Breast Exam: 08/19/2023 benign 2. Mammogram: 04/09/2023: benign, density category B   RTC in 1 year

## 2024-03-04 ENCOUNTER — Other Ambulatory Visit: Payer: Self-pay | Admitting: Hematology and Oncology

## 2024-03-30 ENCOUNTER — Encounter (INDEPENDENT_AMBULATORY_CARE_PROVIDER_SITE_OTHER): Payer: Self-pay | Admitting: Gastroenterology

## 2024-05-17 ENCOUNTER — Other Ambulatory Visit (HOSPITAL_COMMUNITY): Payer: Self-pay | Admitting: Adult Health Nurse Practitioner

## 2024-05-17 DIAGNOSIS — Z1231 Encounter for screening mammogram for malignant neoplasm of breast: Secondary | ICD-10-CM

## 2024-05-26 ENCOUNTER — Ambulatory Visit (HOSPITAL_COMMUNITY)
Admission: RE | Admit: 2024-05-26 | Discharge: 2024-05-26 | Disposition: A | Discharge status: Home / Self Care | Source: Ambulatory Visit | Attending: Adult Health Nurse Practitioner | Admitting: Adult Health Nurse Practitioner

## 2024-05-26 ENCOUNTER — Ambulatory Visit (HOSPITAL_COMMUNITY)

## 2024-05-26 ENCOUNTER — Encounter (HOSPITAL_COMMUNITY): Payer: Self-pay

## 2024-05-26 DIAGNOSIS — Z1231 Encounter for screening mammogram for malignant neoplasm of breast: Secondary | ICD-10-CM | POA: Diagnosis present

## 2024-08-18 ENCOUNTER — Inpatient Hospital Stay: Admitting: Hematology and Oncology
